# Patient Record
Sex: Male | Born: 1970 | Race: White | Hispanic: No | Marital: Married | Smoking: Never smoker
Health system: Southern US, Community
[De-identification: ages and names within clinical notes are randomized; demographics above are authoritative.]

## PROBLEM LIST (undated history)

## (undated) DIAGNOSIS — I10 Essential (primary) hypertension: Secondary | ICD-10-CM

## (undated) DIAGNOSIS — E782 Mixed hyperlipidemia: Secondary | ICD-10-CM

## (undated) DIAGNOSIS — IMO0002 Reserved for concepts with insufficient information to code with codable children: Secondary | ICD-10-CM

## (undated) DIAGNOSIS — J189 Pneumonia, unspecified organism: Secondary | ICD-10-CM

## (undated) DIAGNOSIS — R0602 Shortness of breath: Secondary | ICD-10-CM

## (undated) DIAGNOSIS — M62838 Other muscle spasm: Secondary | ICD-10-CM

## (undated) DIAGNOSIS — Z8709 Personal history of other diseases of the respiratory system: Secondary | ICD-10-CM

## (undated) DIAGNOSIS — R51 Headache: Secondary | ICD-10-CM

## (undated) DIAGNOSIS — R200 Anesthesia of skin: Secondary | ICD-10-CM

## (undated) DIAGNOSIS — Z87442 Personal history of urinary calculi: Secondary | ICD-10-CM

## (undated) DIAGNOSIS — C801 Malignant (primary) neoplasm, unspecified: Secondary | ICD-10-CM

## (undated) DIAGNOSIS — R519 Headache, unspecified: Secondary | ICD-10-CM

## (undated) HISTORY — PX: MRI: SHX5353

## (undated) HISTORY — PX: VASECTOMY: SHX75

## (undated) HISTORY — DX: Essential (primary) hypertension: I10

## (undated) HISTORY — DX: Mixed hyperlipidemia: E78.2

---

## 2003-04-23 HISTORY — PX: STAPEDECTOMY: SHX2435

## 2003-05-18 DIAGNOSIS — J189 Pneumonia, unspecified organism: Secondary | ICD-10-CM

## 2003-05-18 HISTORY — DX: Pneumonia, unspecified organism: J18.9

## 2004-06-03 ENCOUNTER — Ambulatory Visit: Payer: Self-pay | Admitting: Family Medicine

## 2004-07-01 ENCOUNTER — Ambulatory Visit: Payer: Self-pay | Admitting: Family Medicine

## 2004-09-10 ENCOUNTER — Ambulatory Visit: Payer: Self-pay | Admitting: Family Medicine

## 2004-11-02 ENCOUNTER — Ambulatory Visit: Payer: Self-pay | Admitting: Family Medicine

## 2005-03-29 ENCOUNTER — Ambulatory Visit: Payer: Self-pay | Admitting: Family Medicine

## 2006-06-07 ENCOUNTER — Ambulatory Visit: Payer: Self-pay | Admitting: Family Medicine

## 2006-09-12 ENCOUNTER — Ambulatory Visit: Payer: Self-pay | Admitting: Family Medicine

## 2008-05-17 DIAGNOSIS — C801 Malignant (primary) neoplasm, unspecified: Secondary | ICD-10-CM

## 2008-05-17 HISTORY — DX: Malignant (primary) neoplasm, unspecified: C80.1

## 2011-07-30 ENCOUNTER — Other Ambulatory Visit: Payer: Self-pay

## 2011-09-14 ENCOUNTER — Encounter: Payer: Self-pay | Admitting: *Deleted

## 2011-09-17 ENCOUNTER — Encounter: Payer: Self-pay | Admitting: *Deleted

## 2011-09-20 ENCOUNTER — Ambulatory Visit: Payer: Self-pay | Admitting: Cardiology

## 2011-09-21 ENCOUNTER — Encounter: Payer: Self-pay | Admitting: Cardiology

## 2011-09-22 ENCOUNTER — Ambulatory Visit (INDEPENDENT_AMBULATORY_CARE_PROVIDER_SITE_OTHER): Payer: BC Managed Care – PPO | Admitting: Cardiology

## 2011-09-22 ENCOUNTER — Other Ambulatory Visit: Payer: Self-pay | Admitting: Cardiology

## 2011-09-22 ENCOUNTER — Telehealth: Payer: Self-pay | Admitting: Cardiology

## 2011-09-22 ENCOUNTER — Encounter: Payer: Self-pay | Admitting: Cardiology

## 2011-09-22 ENCOUNTER — Encounter: Payer: Self-pay | Admitting: *Deleted

## 2011-09-22 VITALS — BP 117/70 | HR 71 | Ht 71.0 in | Wt 273.0 lb

## 2011-09-22 DIAGNOSIS — R0602 Shortness of breath: Secondary | ICD-10-CM

## 2011-09-22 DIAGNOSIS — R072 Precordial pain: Secondary | ICD-10-CM

## 2011-09-22 DIAGNOSIS — E782 Mixed hyperlipidemia: Secondary | ICD-10-CM

## 2011-09-22 DIAGNOSIS — I10 Essential (primary) hypertension: Secondary | ICD-10-CM

## 2011-09-22 NOTE — Assessment & Plan Note (Signed)
**Note De-Identified Camilo Obfuscation** Reports history of asthma. Could also be related to deconditioning and obesity. Further workup as planned.

## 2011-09-22 NOTE — Assessment & Plan Note (Addendum)
**Note De-Identified Torrance Obfuscation** On fenofibrate, followed by Dr. Lysbeth Galas.

## 2011-09-22 NOTE — Assessment & Plan Note (Signed)
**Note De-Identified Melhorn Obfuscation** Largely atypical in description, not recurrent since March. Suspect GI etiology based on symptoms, although he does have cardiac risk factors including obesity, hypertension, and hyperlipidemia. ECG is nonspecific. With associated shortness of breath as well, plan is to pursue an exercise echocardiogram for further risk stratification. If this is reassuring, would recommend medical therapy and observation. We also discussed diet and exercise.

## 2011-09-22 NOTE — Assessment & Plan Note (Signed)
**Note De-identified Ent Obfuscation** Blood pressure well-controlled today. 

## 2011-09-22 NOTE — Patient Instructions (Signed)
**Note De-identified Kullman Obfuscation** Your follow up will be based on your test results. Your physician recommends that you continue on your current medications as directed. Please refer to the Current Medication list given to you today. Your physician has requested that you have a stress echocardiogram. For further information please visit [email or web address]. Please follow instruction sheet as given. If the results of your test are normal or stable, you will receive a letter. If they are abnormal, the nurse will contact you by phone.  

## 2011-09-22 NOTE — Telephone Encounter (Signed)
**Note De-Identified Emory Obfuscation** stress echocardiogram 786.51, 786.05 scheduled for 09/28/2011 @ [facility]

## 2011-09-22 NOTE — Progress Notes (Signed)
**Note De-Identified Derek Donaldson** Clinical Summary Derek Donaldson is a 41 y.o.male referred by Derek Donaldson for cardiology consultation. Record review finds admission to [facility] back in March on the hospitalist service with chest pain. This was felt to be of GI origin, and he ruled out for myocardial infarction. ECG showed sinus rhythm at 76 with LVH and nonspecific ST changes, possibly early repolarization. He underwent a 6 minute walk test covering 1100 feet with no desaturation or chest pain.  He describes the episode of chest discomfort in question as being a dull ache mainly in the center of his chest, also under the left breast that occurred in the evening and woke him up. He felt as if there was a fullness, he had eaten some potatoes before going to bed. Reports main improvement in symptoms was with a GI cocktail. He denies frequent reflux but has had intermittent dysphasia. Otherwise he reports no exertional chest pain. He does indicate NYHA class II dyspnea on exertion.  He is a [employer] architect, has regular DOT physicals. States that he has had carotid Dopplers due to a bruit on the left, found that this was related to tortuosity rather than obstruction.   No Known Allergies  Current Outpatient Prescriptions  Medication Sig Dispense Refill  . Amlodipine-Valsartan-HCTZ 10-320-25 MG TABS Take 1 tablet by mouth daily.      Marland Kitchen aspirin EC 81 MG tablet Take 81 mg by mouth daily.      . budesonide-formoterol (SYMBICORT) 160-4.5 MCG/ACT inhaler Inhale 2 puffs into the lungs 2 (two) times daily.      . fenofibrate micronized (ANTARA) 130 MG capsule Take 130 mg by mouth daily before breakfast.      . Ibuprofen (ADVIL) 200 MG CAPS Take 1 capsule by mouth as needed.      . nebivolol (BYSTOLIC) 5 MG tablet Take 5 mg by mouth daily.      Marland Kitchen omeprazole (PRILOSEC) 20 MG capsule Take 20 mg by mouth daily.        Past Medical History  Diagnosis Date  . Essential hypertension, benign   . Mixed hyperlipidemia   . Sensorineural hearing  loss of low frequency   . Asthma     Past Surgical History  Procedure Date  . 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 Vasectomy     Family History  Problem Relation Age of Onset  . Stroke Paternal Grandfather   . Cancer Maternal Grandfather   . Asthma Mother   . Asthma Brother   . Heart failure Maternal Grandmother   . Aneurysm Father     Social History Derek Donaldson reports that he has never smoked. He has never used smokeless tobacco. Derek Donaldson reports that he does not drink alcohol.  Review of Systems No palpitations or syncope. Has been working on his diet, states that he has lost approximately 23 pounds. No regular exercise at this point. No syncope. No reported bleeding problems. No orthopnea or PND. Occasional dysphasia. Otherwise negative.   Physical Examination Filed Vitals:   09/22/11 0902  BP: 117/70  Pulse: 71   Obese male in no acute distress. HEENT: Conjunctiva and lids normal, oropharynx clear. Neck: Supple, no elevated JVP, soft left carotid bruit, no thyromegaly. Lungs: Clear to auscultation, nonlabored breathing at rest. Cardiac: Regular rate and rhythm, no S3 or significant systolic murmur, no pericardial rub. Abdomen: Soft, nontender, bowel sounds present, no guarding or rebound. Extremities: No pitting edema, distal pulses 2+. Skin: Warm and dry. Musculoskeletal: No **Note De-Identified Derek Donaldson** kyphosis. Neuropsychiatric: Alert and oriented x3, affect grossly appropriate.   Problem List and Plan   Precordial pain Largely atypical in description, not recurrent since March. Suspect GI etiology based on symptoms, although he does have cardiac risk factors including obesity, hypertension, and hyperlipidemia. ECG is nonspecific. With associated shortness of breath as well, plan is to pursue an exercise echocardiogram for further risk stratification. If this is reassuring, would recommend medical therapy and observation. We also discussed diet and exercise.  Shortness  of breath Reports history of asthma. Could also be related to deconditioning and obesity. Further workup as planned.  Mixed hyperlipidemia On fenofibrate, followed by Derek Donaldson.  Essential hypertension, benign Blood pressure well controlled today.   Derek Donaldson, M.D., F.A.C.C.

## 2011-09-23 NOTE — Telephone Encounter (Signed)
**Note De-Identified Eyerman Obfuscation** Auth # [id number] exp 10/22/11

## 2011-09-28 DIAGNOSIS — R079 Chest pain, unspecified: Secondary | ICD-10-CM

## 2011-10-22 ENCOUNTER — Telehealth: Payer: Self-pay | Admitting: *Deleted

## 2011-10-22 NOTE — Telephone Encounter (Signed)
**Note De-Identified Lamb[place] Obfuscation** Message copied by Eustace Moore on Fri Oct 22, 2011  8:42 AM ------      Message from: MCDOWELL, Illene Bolus      Created: Thu Oct 21, 2011  9:49 AM       Normal study, no evidence of ischemia. Continue observation and risk factor modification with primary care physician.

## 2011-10-22 NOTE — Telephone Encounter (Signed)
**Note De-identified Hasting Obfuscation** Patient informed. 

## 2012-02-04 ENCOUNTER — Encounter (HOSPITAL_COMMUNITY): Payer: Self-pay | Admitting: Emergency Medicine

## 2012-02-04 ENCOUNTER — Emergency Department (HOSPITAL_COMMUNITY): Payer: BC Managed Care – PPO

## 2012-02-04 ENCOUNTER — Emergency Department (HOSPITAL_COMMUNITY)
Admission: EM | Admit: 2012-02-04 | Discharge: 2012-02-04 | Disposition: A | Discharge status: Home / Self Care | Payer: BC Managed Care – PPO | Attending: Emergency Medicine | Admitting: Emergency Medicine

## 2012-02-04 DIAGNOSIS — Z7982 Long term (current) use of aspirin: Secondary | ICD-10-CM | POA: Insufficient documentation

## 2012-02-04 DIAGNOSIS — R071 Chest pain on breathing: Secondary | ICD-10-CM | POA: Insufficient documentation

## 2012-02-04 DIAGNOSIS — I10 Essential (primary) hypertension: Secondary | ICD-10-CM | POA: Insufficient documentation

## 2012-02-04 DIAGNOSIS — R0781 Pleurodynia: Secondary | ICD-10-CM

## 2012-02-04 DIAGNOSIS — J45909 Unspecified asthma, uncomplicated: Secondary | ICD-10-CM | POA: Insufficient documentation

## 2012-02-04 DIAGNOSIS — Z79899 Other long term (current) drug therapy: Secondary | ICD-10-CM | POA: Insufficient documentation

## 2012-02-04 LAB — BASIC METABOLIC PANEL
CO2: 25 mEq/L (ref 19–32)
Calcium: 10.2 mg/dL (ref 8.4–10.5)
Creatinine, Ser: 0.93 mg/dL (ref 0.50–1.35)
GFR calc Af Amer: >90 mL/min (ref >90)
Sodium: 138 mEq/L (ref 135–145)

## 2012-02-04 LAB — CBC
Hemoglobin: 14.9 g/dL (ref 13.0–17.0)
MCHC: 34.4 g/dL (ref 30.0–36.0)
Platelets: 248 10*3/uL (ref 150–400)
RBC: 4.9 MIL/uL (ref 4.22–5.81)

## 2012-02-04 LAB — D-DIMER, QUANTITATIVE: D-Dimer, Quant: 0.3 ug/mL-FEU (ref 0.00–0.48)

## 2012-02-04 LAB — TROPONIN I: Troponin I: <0.3 ng/mL (ref <0.30)

## 2012-02-04 MED ORDER — GI COCKTAIL ~~LOC~~
30.0000 mL | Freq: Once | ORAL | Status: AC
  Administered 2012-02-04: 30 mL via ORAL
  Filled 2012-02-04: qty 30

## 2012-02-04 MED ORDER — TRAMADOL HCL 50 MG PO TABS: 50.0000 mg | ORAL_TABLET | Freq: Four times a day (QID) | ORAL | Status: DC | PRN

## 2012-02-04 MED ORDER — OXYCODONE-ACETAMINOPHEN 5-325 MG PO TABS
1.0000 | ORAL_TABLET | Freq: Once | ORAL | Status: DC
  Filled 2012-02-04: qty 1

## 2012-02-04 MED ORDER — PANTOPRAZOLE SODIUM 40 MG IV SOLR
40.0000 mg | Freq: Once | INTRAVENOUS | Status: AC
  Administered 2012-02-04: 40 mg via INTRAVENOUS
  Filled 2012-02-04: qty 40

## 2012-02-04 NOTE — ED Notes (Signed)
**Note De-Identified Mcconaughey Obfuscation** To ED from work [facility], pt reports CP onset today, assosciated with SOB, VSS, 325 ASA pta, NAD

## 2012-02-04 NOTE — ED Provider Notes (Signed)
**Note De-Identified Kaufhold Obfuscation** Medical screening examination/treatment/procedure(s) were performed by non-physician practitioner and as supervising physician I was immediately available for consultation/collaboration.   Gwyneth Sprout, MD 02/04/12 1538

## 2012-02-04 NOTE — ED Provider Notes (Signed)
**Note De-Identified Campton Obfuscation** History     CSN: [id number]  Arrival date & time 02/04/12  1307   First MD Initiated Contact with Patient 02/04/12 1323      Chief Complaint  Patient presents with  . Chest Pain    (Consider location/radiation/quality/duration/timing/severity/associated sxs/prior treatment) HPI  41 year old male with history of hypertension, and history of asthma present complaining of chest pain. 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 For the past 2 months he has three isolated episodes of substernal chest pain.   Episode begins today while he was sitting and driving his truck. Onset acute.  Describe pain as pleuritic, worsening with taking deep breath, with movement, or with laughing. Pain radiates to back.  Pain has been persistent.  He endorse mild SOB but denies fever, cough, or hemoptysis.  No complaint of calves tenderness or leg swelling.  No recent surgery.  Pain  improved with prilosec in the past but did not help this time.  He also took an aspirin in the AM with no improvement.  He recall same pain that happened 3 days ago and lasting for nearly an hour and resolved without treatment.  His first chest pain episode happened in April in which he had a cardiac work up including tread mill test but it was found to be non-cardiac related.  Pt is not a smoker, no hx of diabetes and no significant family hx of heart disease.  He denies increased DOE.  Denies fever, chills, n/v, or dysuria  Past Medical History  Diagnosis Date  . Essential hypertension, benign   . Mixed hyperlipidemia   . Sensorineural hearing loss of low frequency   . Asthma     Past Surgical History  Procedure Date  . 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 Vasectomy     Family History  Problem Relation Age of Onset  . Stroke Paternal Grandfather   . Cancer Maternal Grandfather   . Asthma Mother   . Asthma Brother   . Heart failure Maternal Grandmother   . Aneurysm Father      History  Substance Use Topics  . Smoking status: Never Smoker   . Smokeless tobacco: Never Used  . Alcohol Use: No      Review of Systems  All other systems reviewed and are negative.    Allergies  Review of patient's allergies indicates no known allergies.  Home Medications   Current Outpatient Rx  Name Route Sig Dispense Refill  . AMLODIPINE-VALSARTAN-HCTZ 10-320-25 MG PO TABS Oral Take 1 tablet by mouth daily.    . ASPIRIN EC 81 MG PO TBEC Oral Take 81 mg by mouth daily.    . BUDESONIDE-FORMOTEROL FUMARATE 160-4.5 MCG/ACT IN AERO Inhalation Inhale 2 puffs into the lungs 2 (two) times daily.    . FENOFIBRATE MICRONIZED 130 MG PO CAPS Oral Take 130 mg by mouth daily before breakfast.    . IBUPROFEN 200 MG PO CAPS Oral Take 1 capsule by mouth as needed.    . NEBIVOLOL HCL 5 MG PO TABS Oral Take 5 mg by mouth daily.    Marland Kitchen OMEPRAZOLE 20 MG PO CPDR Oral Take 20 mg by mouth daily.      BP 140/84  Pulse 75  Temp 98 F (36.7 C) (Oral)  Resp 14  SpO2 95%  Physical Exam  Nursing note and vitals reviewed. Constitutional: He appears well-developed and well-nourished. No distress.       Awake, alert, nontoxic appearance **Note De-Identified Uzelac Obfuscation** HENT:  Head: Atraumatic.  Eyes: Conjunctivae normal are normal. Right eye exhibits no discharge. Left eye exhibits no discharge.  Neck: Normal range of motion. Neck supple.  Cardiovascular: Normal rate and regular rhythm.   Pulmonary/Chest: Effort normal. No respiratory distress. He exhibits no tenderness.       Although pt point to substernum as location of his pain, it is non reproducible with palpation.    Abdominal: Soft. There is tenderness in the epigastric area. There is no rigidity, no rebound, no guarding, no tenderness at McBurney's point and negative Murphy's sign. No hernia.  Musculoskeletal: He exhibits no edema and no tenderness.       ROM appears intact, no obvious focal weakness  Neurological: He is alert.  Skin: Skin is warm and dry. No  rash noted.  Psychiatric: He has a normal mood and affect.    ED Course  Procedures (including critical care time)  Labs Reviewed - No data to display No results found.   No diagnosis found.   Date: 02/04/2012  Rate: 77  Rhythm: normal sinus rhythm  QRS Axis: left  Intervals: normal  ST/T Wave abnormalities: normal  Conduction Disutrbances:none  Narrative Interpretation: probable LVH  Old EKG Reviewed: none available  Results for orders placed during the hospital encounter of 02/04/12  CBC      Component Value Range   WBC 11.8 (*) 4.0 - 10.5 K/uL   RBC 4.90  4.22 - 5.81 MIL/uL   Hemoglobin 14.9  13.0 - 17.0 g/dL   HCT 13.2  44.0 - 10.2 %   MCV 88.4  78.0 - 100.0 fL   MCH 30.4  26.0 - 34.0 pg   MCHC 34.4  30.0 - 36.0 g/dL   RDW 72.5  36.6 - 44.0 %   Platelets 248  150 - 400 K/uL  TROPONIN I      Component Value Range   Troponin I <0.30  <0.30 ng/mL  BASIC METABOLIC PANEL      Component Value Range   Sodium 138  135 - 145 mEq/L   Potassium 3.4 (*) 3.5 - 5.1 mEq/L   Chloride 101  96 - 112 mEq/L   CO2 25  19 - 32 mEq/L   Glucose, Bld 101 (*) 70 - 99 mg/dL   BUN 14  6 - 23 mg/dL   Creatinine, Ser 3.47  0.50 - 1.35 mg/dL   Calcium 42.5  8.4 - 95.6 mg/dL   GFR calc non Af Amer >90  >90 mL/min   GFR calc Af Amer >90  >90 mL/min  D-DIMER, QUANTITATIVE      Component Value Range   D-Dimer, Quant 0.30  0.00 - 0.48 ug/mL-FEU   Dg Chest Port 1 View  02/04/2012  *RADIOLOGY REPORT*  Clinical Data: Recurring chest pain over 3 days, hypertension  PORTABLE CHEST - 1 VIEW  Comparison: Portable exam 1400 hours compared to 07/30/2011  Findings: Upper normal heart size. Mediastinal contours and pulmonary vascularity normal for technique. Slightly decreased lung volumes with minimal bibasilar atelectasis. Upper lungs clear. No pleural effusion or pneumothorax. Bones unremarkable.  IMPRESSION: Minimal bibasilar atelectasis.   Original Report Authenticated By: Lollie Marrow, M.D.      1. Chest pain  MDM  Atypical chest pain, with pleuritic component.  Will order d-dimer to r/o PE.  Will obtain CXR, labs, ECG, troponin.  Will also give GI cocktail and protonix for suspected GERD.  Pt has cardiac work up with treadmill test earlier this year, doubt **Note De-Identified Ilic Obfuscation** cardiac induce cp at this time.     2:49 PM Pt report minimal improvement with GI cocktail and protonix.  His work up today is unremarkable.  D-dimer neg, CXR shows no acute findings. Normal troponin.  ECG unremarkable, not supportive of pericarditis either.  I recommend pt to f/u with his PCP Dr. Lysbeth Galas for further care.  Will prescribe short course of pain medications.  Recommend no driving or operate heavy machinery while on medication.  Pt voice understanding and agrees with plan.  Discussed care with my attending.      Fayrene Helper, PA-C 02/04/12 1453

## 2012-02-17 ENCOUNTER — Encounter (HOSPITAL_COMMUNITY): Payer: Self-pay | Admitting: Emergency Medicine

## 2012-02-17 ENCOUNTER — Emergency Department (HOSPITAL_COMMUNITY)
Admission: EM | Admit: 2012-02-17 | Discharge: 2012-02-18 | Disposition: A | Discharge status: Home / Self Care | Payer: BC Managed Care – PPO | Attending: Emergency Medicine | Admitting: Emergency Medicine

## 2012-02-17 ENCOUNTER — Emergency Department (HOSPITAL_COMMUNITY): Payer: BC Managed Care – PPO

## 2012-02-17 ENCOUNTER — Other Ambulatory Visit: Payer: Self-pay

## 2012-02-17 DIAGNOSIS — E785 Hyperlipidemia, unspecified: Secondary | ICD-10-CM | POA: Insufficient documentation

## 2012-02-17 DIAGNOSIS — H905 Unspecified sensorineural hearing loss: Secondary | ICD-10-CM | POA: Insufficient documentation

## 2012-02-17 DIAGNOSIS — J45909 Unspecified asthma, uncomplicated: Secondary | ICD-10-CM | POA: Insufficient documentation

## 2012-02-17 DIAGNOSIS — Z79899 Other long term (current) drug therapy: Secondary | ICD-10-CM | POA: Insufficient documentation

## 2012-02-17 DIAGNOSIS — R1013 Epigastric pain: Secondary | ICD-10-CM | POA: Insufficient documentation

## 2012-02-17 DIAGNOSIS — K219 Gastro-esophageal reflux disease without esophagitis: Secondary | ICD-10-CM | POA: Insufficient documentation

## 2012-02-17 DIAGNOSIS — I10 Essential (primary) hypertension: Secondary | ICD-10-CM | POA: Insufficient documentation

## 2012-02-17 LAB — CBC WITH DIFFERENTIAL/PLATELET
Basophils Absolute: 0 10*3/uL (ref 0.0–0.1)
Basophils Relative: 0 % (ref 0–1)
HCT: 41.2 % (ref 39.0–52.0)
Lymphocytes Relative: 9 % — ABNORMAL LOW (ref 12–46)
MCHC: 35.4 g/dL (ref 30.0–36.0)
Monocytes Absolute: 0.5 10*3/uL (ref 0.1–1.0)
Neutro Abs: 10 10*3/uL — ABNORMAL HIGH (ref 1.7–7.7)
Neutrophils Relative %: 86 % — ABNORMAL HIGH (ref 43–77)
Platelets: 285 10*3/uL (ref 150–400)
RDW: 12.7 % (ref 11.5–15.5)
WBC: 11.7 10*3/uL — ABNORMAL HIGH (ref 4.0–10.5)

## 2012-02-17 LAB — COMPREHENSIVE METABOLIC PANEL
ALT: 86 U/L — ABNORMAL HIGH (ref 0–53)
AST: 85 U/L — ABNORMAL HIGH (ref 0–37)
Albumin: 4.6 g/dL (ref 3.5–5.2)
Alkaline Phosphatase: 61 U/L (ref 39–117)
CO2: 25 mEq/L (ref 19–32)
Chloride: 103 mEq/L (ref 96–112)
Creatinine, Ser: 1.05 mg/dL (ref 0.50–1.35)
GFR calc non Af Amer: 87 mL/min — ABNORMAL LOW (ref >90)
Potassium: 3.8 mEq/L (ref 3.5–5.1)
Sodium: 139 mEq/L (ref 135–145)
Total Bilirubin: 1.4 mg/dL — ABNORMAL HIGH (ref 0.3–1.2)

## 2012-02-17 MED ORDER — GI COCKTAIL ~~LOC~~
30.0000 mL | Freq: Once | ORAL | Status: AC
  Administered 2012-02-17: 30 mL via ORAL
  Filled 2012-02-17: qty 30

## 2012-02-17 MED ORDER — GI COCKTAIL ~~LOC~~: 15.0000 mL | Freq: Two times a day (BID) | ORAL | Status: DC

## 2012-02-17 MED ORDER — MORPHINE SULFATE 4 MG/ML IJ SOLN
4.0000 mg | Freq: Once | INTRAMUSCULAR | Status: AC
Start: 2012-02-17 — End: 2012-02-17
  Administered 2012-02-17: 4 mg via INTRAVENOUS
  Filled 2012-02-17: qty 1

## 2012-02-17 MED ORDER — SODIUM CHLORIDE 0.9 % IV BOLUS (SEPSIS)
1000.0000 mL | Freq: Once | INTRAVENOUS | Status: AC
  Administered 2012-02-17: 1000 mL via INTRAVENOUS

## 2012-02-17 MED ORDER — ONDANSETRON HCL 4 MG/2ML IJ SOLN
4.0000 mg | Freq: Once | INTRAMUSCULAR | Status: AC
  Administered 2012-02-17: 4 mg via INTRAVENOUS
  Filled 2012-02-17: qty 2

## 2012-02-17 NOTE — ED Notes (Signed)
**Note De-Identified Nola Obfuscation** Patient reports abdominal pain that started around 1500 this afternoon; patient reports history of GERD.  Patient states that abdominal pain worsens upon lying down; shortness of breath also occurs.  Denies nausea, vomiting, and diarrhea.  Patient was seen two weeks ago for the same symptoms; patient was given a GI cocktail -- 30 minutes after administration, pain was gone.

## 2012-02-17 NOTE — ED Provider Notes (Signed)
**Note De-Identified Kendle Obfuscation** History     CSN: [id number]  Arrival date & time 02/17/12  2140   First MD Initiated Contact with Patient 02/17/12 2223      Chief Complaint  Patient presents with  . Abdominal Pain    (Consider location/radiation/quality/duration/timing/severity/associated sxs/prior treatment) HPI  41 y.o. male with past medical history significant for hyperlipidemia, hypertension and GERD c/o severe epigastric pain starting at 11 AM after eating associated with sensation of bloating. Pt consistently has pain after eating, but today it is much more severe. States the pain is exacerbated by movement, deepbrethaing and lying supine. Denies fever, shortness of breath, chest pain, diarrhea, nausea, vomiting.    Stress test 4 months ago with no abnormalities  Past Medical History  Diagnosis Date  . Essential hypertension, benign   . Mixed hyperlipidemia   . Sensorineural hearing loss of low frequency   . Asthma   . GERD (gastroesophageal reflux disease)     Past Surgical History  Procedure Date  . 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 Vasectomy     Family History  Problem Relation Age of Onset  . Stroke Paternal Grandfather   . Cancer Maternal Grandfather   . Asthma Mother   . Asthma Brother   . Heart failure Maternal Grandmother   . Aneurysm Father     History  Substance Use Topics  . Smoking status: Never Smoker   . Smokeless tobacco: Never Used  . Alcohol Use: No      Review of Systems  Constitutional: Negative for fever.  Respiratory: Negative for shortness of breath.   Cardiovascular: Negative for chest pain.  Gastrointestinal: Positive for abdominal pain. Negative for nausea, vomiting and diarrhea.  All other systems reviewed and are negative.    Allergies  Review of patient's allergies indicates no known allergies.  Home Medications   Current Outpatient Rx  Name Route Sig Dispense Refill  . ACETAMINOPHEN 500 MG PO TABS Oral Take 1,000 mg by  mouth every 6 (six) hours as needed. For pain    . AMLODIPINE-VALSARTAN-HCTZ 10-320-25 MG PO TABS Oral Take 1 tablet by mouth daily.    . ASPIRIN EC 81 MG PO TBEC Oral Take 81 mg by mouth daily.    . BUDESONIDE-FORMOTEROL FUMARATE 160-4.5 MCG/ACT IN AERO Inhalation Inhale 2 puffs into the lungs 2 (two) times daily.    . DEXLANSOPRAZOLE 60 MG PO CPDR Oral Take 60 mg by mouth daily.    . FENOFIBRATE MICRONIZED 130 MG PO CAPS Oral Take 130 mg by mouth daily before breakfast.    . NEBIVOLOL HCL 5 MG PO TABS Oral Take 5 mg by mouth daily.    Marland Kitchen OMEPRAZOLE 20 MG PO CPDR Oral Take 20 mg by mouth daily.      BP 128/77  Pulse 70  Temp 97.7 F (36.5 C) (Oral)  Resp 19  SpO2 96%  Physical Exam  Nursing note and vitals reviewed. Constitutional: He is oriented to person, place, and time. He appears well-developed and well-nourished. No distress.       Patient is staying very still, eyes are closed he is grasping his upper abdomen  HENT:  Head: Normocephalic.  Mouth/Throat: Oropharynx is clear and moist.  Eyes: Conjunctivae normal and EOM are normal.  Neck: Normal range of motion.  Cardiovascular: Normal rate, regular rhythm and normal heart sounds.   Pulmonary/Chest: Effort normal and breath sounds normal. No stridor. No respiratory distress. He has no wheezes. He has no rales. He exhibits **Note De-Identified Stitzer Obfuscation** no tenderness.  Abdominal: Soft. Bowel sounds are normal. He exhibits no distension and no mass. There is tenderness. There is no rebound and no guarding.       Positive Murphy sign, also tender to palpation of the epigastrium, no peritoneal signs  Musculoskeletal: Normal range of motion.  Neurological: He is alert and oriented to person, place, and time.  Skin: Skin is warm.  Psychiatric: He has a normal mood and affect.    ED Course  Procedures (including critical care time)  Labs Reviewed  CBC WITH DIFFERENTIAL - Abnormal; Notable for the following:    WBC 11.7 (*)     Neutrophils Relative 86 (*)       Neutro Abs 10.0 (*)     Lymphocytes Relative 9 (*)     All other components within normal limits  COMPREHENSIVE METABOLIC PANEL - Abnormal; Notable for the following:    Glucose, Bld 140 (*)     AST 85 (*)     ALT 86 (*)     Total Bilirubin 1.4 (*)     GFR calc non Af Amer 87 (*)     All other components within normal limits  URINALYSIS, ROUTINE W REFLEX MICROSCOPIC  LIPASE, BLOOD   Dg Abd Acute W/chest  02/17/2012  *RADIOLOGY REPORT*  Clinical Data: Abdominal pain starting today.  Shortness of breath.  ACUTE ABDOMEN SERIES (ABDOMEN 2 VIEW & CHEST 1 VIEW)  Comparison: 02/04/2012.  Findings: There is some increased material in the stomach.  Stool is mildly increased in the colon.  No obstruction or free air. Normal soft tissues and bony structures.  The lungs are clear.  The heart, mediastinum hila are unremarkable.  No significant bony abnormality.  IMPRESSION: No acute findings.  Mild increase stool in the colon and some increased material in the stomach.   Original Report Authenticated By: Domenic Moras, M.D.     Date: 02/17/2012  Rate: 71  Rhythm: normal sinus rhythm  QRS Axis: indeterminate  Intervals: normal  ST/T Wave abnormalities: normal  Conduction Disutrbances:none  Narrative Interpretation:   Old EKG Reviewed: none available  1. Epigastric pain       MDM  Suspect biliary colic/cholecystitis vs PUD >> ACS  Abdominal exam shows no peritoneal signs, AAS and [place] ordered.   Pain control with morphine and GI cocktail.   AAS wnl  12:10 AM patient reexamined he is significantly improved with pain down 2/10.    12:16 AM ultrasound shows multiple gallstones but no evidence of acute cholecystitis male to be fatty infiltration of the liver.  No acute findings and patient is significantly improved I will discharge him with pain control and GI followup.   Pt verbalized understanding and agrees with care plan. Outpatient follow-up and return precautions given.    New  Prescriptions   ALUM & MAG HYDROXIDE-SIMETH (GI COCKTAIL) SUSP SUSPENSION    Take 15 mLs by mouth 3 (three) times daily. Shake well.   OXYCODONE-ACETAMINOPHEN (PERCOCET/ROXICET) 5-325 MG PER TABLET    1 to 2 tabs PO q6hrs  PRN for pain    Wynetta Emery, PA-C 02/18/12 0017

## 2012-02-18 LAB — POCT I-STAT TROPONIN I: Troponin i, poc: 0 ng/mL (ref 0.00–0.08)

## 2012-02-18 MED ORDER — GI COCKTAIL ~~LOC~~: 15.0000 mL | Freq: Three times a day (TID) | ORAL | Status: DC

## 2012-02-18 MED ORDER — OXYCODONE-ACETAMINOPHEN 5-325 MG PO TABS: ORAL_TABLET | ORAL | Status: DC

## 2012-02-20 NOTE — ED Provider Notes (Signed)
**Note De-Identified Abboud Obfuscation** Medical screening examination/treatment/procedure(s) were performed by non-physician practitioner and as supervising physician I was immediately available for consultation/collaboration.    Shelda Jakes, MD 02/20/12 2217

## 2012-02-23 ENCOUNTER — Encounter (INDEPENDENT_AMBULATORY_CARE_PROVIDER_SITE_OTHER): Payer: Self-pay

## 2012-03-06 ENCOUNTER — Encounter (INDEPENDENT_AMBULATORY_CARE_PROVIDER_SITE_OTHER): Payer: Self-pay | Admitting: Surgery

## 2012-03-06 ENCOUNTER — Ambulatory Visit (INDEPENDENT_AMBULATORY_CARE_PROVIDER_SITE_OTHER): Payer: BC Managed Care – PPO | Admitting: Surgery

## 2012-03-06 VITALS — BP 124/88 | HR 100 | Resp 16 | Ht 71.0 in | Wt 263.0 lb

## 2012-03-06 DIAGNOSIS — K8010 Calculus of gallbladder with chronic cholecystitis without obstruction: Secondary | ICD-10-CM

## 2012-03-06 NOTE — Patient Instructions (Addendum)
**Note De-Identified Haberkorn Obfuscation** See the Handout(s) we gave you.  Consider surgery.  Please call our office at [phone] if you wish to schedule surgery or if you have further questions / concerns.   Cholelithiasis Cholelithiasis (also called gallstones) is a form of gallbladder disease where gallstones form in your gallbladder. The gallbladder is a non-essential organ that stores bile made in the liver, which helps digest fats. Gallstones begin as small crystals and slowly grow into stones. Gallstone pain occurs when the gallbladder spasms, and a gallstone is blocking the duct. Pain can also occur when a stone passes out of the duct.  Women are more likely to develop gallstones than men. Other factors that increase the risk of gallbladder disease are:  Having multiple pregnancies. Physicians sometimes advise removing diseased gallbladders before future pregnancies.  Obesity.  Diets heavy in fried foods and fat.  Increasing age (older than 17).  Prolonged use of medications containing male hormones.  Diabetes mellitus.  Rapid weight loss.  Family history of gallstones (heredity). SYMPTOMS  Feeling sick to your stomach (nauseous).  Abdominal pain.  Yellowing of the skin (jaundice).  Sudden pain. It may persist from several minutes to several hours.  Worsening pain with deep breathing or when jarred.  Fever.  Tenderness to the touch. In some cases, when gallstones do not move into the bile duct, people have no pain or symptoms. These are called "silent" gallstones. TREATMENT In severe cases, emergency surgery may be required. HOME CARE INSTRUCTIONS   Only take over-the-counter or prescription medicines for pain, discomfort, or fever as directed by your caregiver.  Follow a low-fat diet until seen again. Fat causes the gallbladder to contract, which can result in pain.  Follow up as instructed. Attacks are almost always recurrent and surgery is usually required for permanent treatment. SEEK  IMMEDIATE MEDICAL CARE IF:   Your pain increases and is not controlled by medications.  You have an oral temperature above 102 F (38.9 C), not controlled by medication.  You develop nausea and vomiting. MAKE SURE YOU:   Understand these instructions.  Will watch your condition.  Will get help right away if you are not doing well or get worse. Document Released: 04/29/2005 Document Revised: 07/26/2011 Document Reviewed: 07/02/2010 [facility] Patient Information 2013 [place], [place].  Gastroesophageal Reflux Disease, Adult Gastroesophageal reflux disease (GERD) happens when acid from your stomach flows up into the esophagus. When acid comes in contact with the esophagus, the acid causes soreness (inflammation) in the esophagus. Over time, GERD may create small holes (ulcers) in the lining of the esophagus. CAUSES   Increased body weight. This puts pressure on the stomach, making acid rise from the stomach into the esophagus.  Smoking. This increases acid production in the stomach.  Drinking alcohol. This causes decreased pressure in the lower esophageal sphincter (valve or ring of muscle between the esophagus and stomach), allowing acid from the stomach into the esophagus.  Late evening meals and a full stomach. This increases pressure and acid production in the stomach.  A malformed lower esophageal sphincter. Sometimes, no cause is found. SYMPTOMS   Burning pain in the lower part of the mid-chest behind the breastbone and in the mid-stomach area. This may occur twice a week or more often.  Trouble swallowing.  Sore throat.  Dry cough.  Asthma-like symptoms including chest tightness, shortness of breath, or wheezing. DIAGNOSIS  Your caregiver may be able to diagnose GERD based on your symptoms. In some cases, X-rays and other tests may be done **Note De-Identified Moro Obfuscation** to check for complications or to check the condition of your stomach and esophagus. TREATMENT  Your caregiver may recommend  over-the-counter or prescription medicines to help decrease acid production. Ask your caregiver before starting or adding any new medicines.  HOME CARE INSTRUCTIONS   Change the factors that you can control. Ask your caregiver for guidance concerning weight loss, quitting smoking, and alcohol consumption.  Avoid foods and drinks that make your symptoms worse, such as:  Caffeine or alcoholic drinks.  Chocolate.  Peppermint or mint flavorings.  Garlic and onions.  Spicy foods.  Citrus fruits, such as oranges, lemons, or limes.  Tomato-based foods such as sauce, chili, salsa, and pizza.  Fried and fatty foods.  Avoid lying down for the 3 hours prior to your bedtime or prior to taking a nap.  Eat small, frequent meals instead of large meals.  Wear loose-fitting clothing. Do not wear anything tight around your waist that causes pressure on your stomach.  Raise the head of your bed 6 to 8 inches with wood blocks to help you sleep. Extra pillows will not help.  Only take over-the-counter or prescription medicines for pain, discomfort, or fever as directed by your caregiver.  Do not take aspirin, ibuprofen, or other nonsteroidal anti-inflammatory drugs (NSAIDs). SEEK IMMEDIATE MEDICAL CARE IF:   You have pain in your arms, neck, jaw, teeth, or back.  Your pain increases or changes in intensity or duration.  You develop nausea, vomiting, or sweating (diaphoresis).  You develop shortness of breath, or you faint.  Your vomit is green, yellow, black, or looks like coffee grounds or blood.  Your stool is red, bloody, or black. These symptoms could be signs of other problems, such as heart disease, gastric bleeding, or esophageal bleeding. MAKE SURE YOU:   Understand these instructions.  Will watch your condition.  Will get help right away if you are not doing well or get worse. Document Released: 02/10/2005 Document Revised: 07/26/2011 Document Reviewed:  11/20/2010 [facility] Patient Information 2013 [place], [place].

## 2012-03-06 NOTE — Progress Notes (Signed)
**Note De-Identified Balliet Obfuscation** Subjective:     Patient ID: Derek Donaldson, male   DOB: [birth year], 41 y.o.   MRN: [id number]  HPI  Derek Donaldson  1970-05-30 [id number]  Patient Care Team: Joette Catching, MD as PCP - General (Family Medicine) Petra Kuba, MD as Consulting Physician (Gastroenterology) Jonelle Sidle, MD as Consulting Physician (Cardiology) Ardeth Sportsman, MD as Consulting Physician (General Surgery)  This patient is a 41 y.o.male who presents today for surgical evaluation at the request of Dr. Ewing Schlein.   Reason for evaluation: Epigastric pain.  Gallstones.  Question of biliary etiology.  Pleasant obese male.  Actively been losing weight this year.  Has had episodes of severe intermittent epigastric pain.  Often radiates to back and shoulder.  Tends not to favor a side.  No real nausea or vomiting but often has loading with it.  Usually after eating.  Not always with fatty foods of the worst attack was after going to [employer] having chicken and french fries.  Has reflux and has had evidence of epigastric irritation but no her radiation up the throat or chest.  Can sleep lying down flat no problem bending over.  Has been on omeprazole for some time.  Was switched to Dexon.  Was increased to double dose.  Symptoms may be a little better but still getting attacks.  Had a more severe episode earlier in the year.  Had cardiac workup that was completely normal.  Normally her walk 30 minutes without much difficulty.  Bowel movements every morning.  Was seen by gastroenterology.  Initially they were favoring reflux.  However, endoscopy did not show any major abnormalities and therefore it they were more inclined to think of biliary etiology.  Had increased bilirubin on one test.  Ultrasound showed gallstones.  Sent to me for consideration of cholecystectomy.  Patient Active Problem List  Diagnosis  . Essential hypertension, benign  . Mixed hyperlipidemia  . Chest pain, midsternal  . Shortness of breath  . GERD  (gastroesophageal reflux disease)  . Chronic cholecystitis with calculus    Past Medical History  Diagnosis Date  . Essential hypertension, benign   . Mixed hyperlipidemia   . Sensorineural hearing loss of low frequency   . Asthma   . GERD (gastroesophageal reflux disease)     Past Surgical History  Procedure Date  . 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 Vasectomy     History   Social History  . Marital Status: Married    Spouse Name: N/A    Number of Children: N/A  . Years of Education: N/A   Occupational History  . Not on file.   Social History Main Topics  . Smoking status: Never Smoker   . Smokeless tobacco: Never Used  . Alcohol Use: No  . Drug Use: No  . Sexually Active: Not on file   Other Topics Concern  . Not on file   Social History Narrative  . No narrative on file    Family History  Problem Relation Age of Onset  . Stroke Paternal Grandfather   . Cancer Maternal Grandfather     unknown type  . Asthma Mother   . Asthma Brother   . Heart failure Maternal Grandmother   . Aneurysm Father     Current Outpatient Prescriptions  Medication Sig Dispense Refill  . acetaminophen (TYLENOL) 500 MG tablet Take 1,000 mg by mouth every 6 (six) hours as needed. For pain      . Alum & **Note De-Identified Dicola Obfuscation** Mag Hydroxide-Simeth (GI COCKTAIL) SUSP suspension Take 15 mLs by mouth 3 (three) times daily. Shake well.  200 mL  0  . Amlodipine-Valsartan-HCTZ 10-320-25 MG TABS Take 1 tablet by mouth daily.      Marland Kitchen aspirin EC 81 MG tablet Take 81 mg by mouth daily.      . budesonide-formoterol (SYMBICORT) 160-4.5 MCG/ACT inhaler Inhale 2 puffs into the lungs 2 (two) times daily.      Marland Kitchen dexlansoprazole (DEXILANT) 60 MG capsule Take 60 mg by mouth daily.      . fenofibrate micronized (ANTARA) 130 MG capsule Take 130 mg by mouth daily before breakfast.      . nebivolol (BYSTOLIC) 5 MG tablet Take 5 mg by mouth daily.      Marland Kitchen oxyCODONE-acetaminophen (PERCOCET/ROXICET) 5-325  MG per tablet 1 to 2 tabs PO q6hrs  PRN for pain  10 tablet  0     No Known Allergies  BP 124/88  Pulse 100  Resp 16  Ht 5\' 11"  (1.803 m)  Wt 263 lb (119.296 kg)  BMI 36.68 kg/m2  US Abdomen Complete  02/18/2012  *RADIOLOGY REPORT*  Clinical Data:  COMPLETE ABDOMINAL ULTRASOUND  Comparison:  None.  Findings:  Gallbladder:  There are multiple gallstones.  The largest measures 18 mm.  There is no gallbladder wall thickening and no pericholecystic fluid.  No evidence of acute cholecystitis.  Common bile duct:  Common duct not well seen distally that normal in caliber proximally measuring 6.5 mm.  Liver:  Liver shows increased echogenicity suggesting diffuse fatty infiltration.  Liver is normal in overall size with no mass or focal lesion.  IVC:  Appears normal.  Pancreas:  Pancreas is echogenic, where visualized, consistent with partial fatty replacement.  Much of the inferior head and distal tail were obscured by overlying bowel gas.  Spleen:  9.1 cm, normal in overall size and echogenicity  Right Kidney:  12.5 cm, normal in size and echogenicity with no masses, stones or hydronephrosis  Left Kidney:  14.5 cm,  normal in size and echogenicity with no masses or stones or hydronephrosis  Abdominal aorta:  Aorta not visualized due to overlying bowel gas  IMPRESSION: Exam is somewhat limited by the patient's body habitus and midline bowel gas.  Allowing for this, there are multiple gallstones but no evidence of acute cholecystitis.  There is evidence suggesting fatty infiltration of the liver.  No other abnormalities.   Original Report Authenticated By: Domenic Moras, M.D.    Dg Abd Acute W/chest  02/17/2012  *RADIOLOGY REPORT*  Clinical Data: Abdominal pain starting today.  Shortness of breath.  ACUTE ABDOMEN SERIES (ABDOMEN 2 VIEW & CHEST 1 VIEW)  Comparison: 02/04/2012.  Findings: There is some increased material in the stomach.  Stool is mildly increased in the colon.  No obstruction or free air.  Normal soft tissues and bony structures.  The lungs are clear.  The heart, mediastinum hila are unremarkable.  No significant bony abnormality.  IMPRESSION: No acute findings.  Mild increase stool in the colon and some increased material in the stomach.   Original Report Authenticated By: Domenic Moras, M.D.    Results for GERLAD, PELZEL (MRN [id number]) as of 03/06/2012 14:10  Ref. Range 02/17/2012 22:20  Sodium Latest Range: 135-145 mEq/L 139  Potassium Latest Range: 3.5-5.1 mEq/L 3.8  Chloride Latest Range: 96-112 mEq/L 103  CO2 Latest Range: 19-32 mEq/L 25  BUN Latest Range: 6-23 mg/dL 16  Creatinine Latest Range: 0.50-1.35 mg/dL 8.46 **Note De-Identified Matzek Obfuscation** Calcium Latest Range: 8.4-10.5 mg/dL 81.1  GFR calc non Af Amer Latest Range: >90 mL/min 87 (L)  GFR calc Af Amer Latest Range: >90 mL/min >90  Glucose Latest Range: 70-99 mg/dL 914 (H)  Alkaline Phosphatase Latest Range: 39-117 U/L 61  Albumin Latest Range: 3.5-5.2 g/dL 4.6  AST Latest Range: 0-37 U/L 85 (H)  ALT Latest Range: 0-53 U/L 86 (H)  Total Protein Latest Range: 6.0-8.3 g/dL 7.8  Total Bilirubin Latest Range: 0.3-1.2 mg/dL 1.4 (H)    Review of Systems  Constitutional: Negative for fever, chills and diaphoresis.  HENT: Negative for nosebleeds, sore throat, facial swelling, mouth sores, trouble swallowing, neck pain and ear discharge.   Eyes: Negative for photophobia, discharge and visual disturbance.  Respiratory: Negative for choking, chest tightness, shortness of breath and stridor.   Cardiovascular: Negative for chest pain and palpitations.  Gastrointestinal: Positive for abdominal pain and abdominal distention. Negative for nausea, vomiting, diarrhea, constipation, blood in stool, anal bleeding and rectal pain.  Genitourinary: Negative for dysuria, urgency, difficulty urinating and testicular pain.  Musculoskeletal: Positive for myalgias and back pain. Negative for arthralgias and gait problem.  Skin: Negative for color change, pallor, rash  and wound.  Neurological: Negative for dizziness, speech difficulty, weakness, numbness and headaches.  Hematological: Negative for adenopathy. Does not bruise/bleed easily.  Psychiatric/Behavioral: Negative for hallucinations, confusion and agitation.       Objective:   Physical Exam  Constitutional: He is oriented to person, place, and time. He appears well-developed and well-nourished. No distress.  HENT:  Head: Normocephalic.  Mouth/Throat: Oropharynx is clear and moist. No oropharyngeal exudate.  Eyes: Conjunctivae normal and EOM are normal. Pupils are equal, round, and reactive to light. No scleral icterus.  Neck: Normal range of motion. Neck supple. No tracheal deviation present.  Cardiovascular: Normal rate, regular rhythm and intact distal pulses.   Pulmonary/Chest: Effort normal and breath sounds normal. No respiratory distress. He has no rales. He exhibits no tenderness.  Abdominal: Soft. He exhibits no distension and no mass. There is no tenderness. There is no guarding. Hernia confirmed negative in the right inguinal area and confirmed negative in the left inguinal area.  Musculoskeletal: Normal range of motion. He exhibits no tenderness.  Lymphadenopathy:    He has no cervical adenopathy.       Right: No inguinal adenopathy present.       Left: No inguinal adenopathy present.  Neurological: He is alert and oriented to person, place, and time. No cranial nerve deficit. He exhibits normal muscle tone. Coordination normal.  Skin: Skin is warm and dry. No rash noted. He is not diaphoretic. No erythema. No pallor.  Psychiatric: He has a normal mood and affect. His behavior is normal. Judgment and thought content normal.       Assessment:     Postprandial epigastric pain suspicious for biliary colic especially in the setting of relatively normal endoscopy and no improvement on PPIs.    Plan:     I think he would benefit from cholecystectomy.  Had a long discussion with him  about differential diagnosis and interventions.  I cautioned him this is not 100% chance of salts all problems.  However, he started and seen by Dr. Melanee Spry and has not had improvement in symptoms with PPIs.  He has had a negative cardiac workup early in the year.  Pretty good exercise tolerance.  I discussed the procedure with him:  The anatomy & physiology of hepatobiliary & pancreatic function was discussed. **Note De-Identified Justen Obfuscation** The pathophysiology of gallbladder dysfunction was discussed.  Natural history risks without surgery was discussed.   I feel the risks of no intervention will lead to serious problems that outweigh the operative risks; therefore, I recommended cholecystectomy to remove the pathology.  I explained laparoscopic techniques with possible need for an open approach.  Probable cholangiogram to evaluate the bilary tract was explained as well.    Risks such as bleeding, infection, abscess, leak, injury to other organs, need for further treatment, heart attack, death, and other risks were discussed.  I noted a good likelihood this will help address the problem.  Possibility that this will not correct all abdominal symptoms was explained.  Goals of post-operative recovery were discussed as well.  We will work to minimize complications.  An educational handout further explaining the pathology and treatment options was given as well.  Questions were answered.  The patient expresses understanding & wishes to proceed with surgery.

## 2012-03-17 ENCOUNTER — Other Ambulatory Visit (INDEPENDENT_AMBULATORY_CARE_PROVIDER_SITE_OTHER): Payer: Self-pay | Admitting: Surgery

## 2012-03-17 DIAGNOSIS — K8010 Calculus of gallbladder with chronic cholecystitis without obstruction: Secondary | ICD-10-CM

## 2012-03-17 HISTORY — PX: CHOLECYSTECTOMY: SHX55

## 2012-03-20 ENCOUNTER — Other Ambulatory Visit: Payer: Self-pay

## 2012-03-20 ENCOUNTER — Observation Stay (HOSPITAL_COMMUNITY)
Admission: EM | Admit: 2012-03-20 | Discharge: 2012-03-22 | DRG: 208 | Disposition: A | Discharge status: Home / Self Care | Payer: BC Managed Care – PPO | Attending: Surgery | Admitting: Surgery

## 2012-03-20 ENCOUNTER — Encounter (HOSPITAL_COMMUNITY): Payer: Self-pay | Admitting: *Deleted

## 2012-03-20 ENCOUNTER — Emergency Department (HOSPITAL_COMMUNITY): Payer: BC Managed Care – PPO

## 2012-03-20 DIAGNOSIS — K838 Other specified diseases of biliary tract: Principal | ICD-10-CM | POA: Insufficient documentation

## 2012-03-20 DIAGNOSIS — R072 Precordial pain: Secondary | ICD-10-CM | POA: Diagnosis present

## 2012-03-20 DIAGNOSIS — I10 Essential (primary) hypertension: Secondary | ICD-10-CM | POA: Diagnosis present

## 2012-03-20 DIAGNOSIS — Z79899 Other long term (current) drug therapy: Secondary | ICD-10-CM | POA: Insufficient documentation

## 2012-03-20 DIAGNOSIS — K8050 Calculus of bile duct without cholangitis or cholecystitis without obstruction: Secondary | ICD-10-CM | POA: Diagnosis present

## 2012-03-20 DIAGNOSIS — R109 Unspecified abdominal pain: Secondary | ICD-10-CM

## 2012-03-20 DIAGNOSIS — J45909 Unspecified asthma, uncomplicated: Secondary | ICD-10-CM | POA: Insufficient documentation

## 2012-03-20 DIAGNOSIS — K219 Gastro-esophageal reflux disease without esophagitis: Secondary | ICD-10-CM | POA: Insufficient documentation

## 2012-03-20 DIAGNOSIS — E782 Mixed hyperlipidemia: Secondary | ICD-10-CM | POA: Insufficient documentation

## 2012-03-20 DIAGNOSIS — H905 Unspecified sensorineural hearing loss: Secondary | ICD-10-CM | POA: Insufficient documentation

## 2012-03-20 LAB — CBC WITH DIFFERENTIAL/PLATELET
Basophils Relative: 1 % (ref 0–1)
Eosinophils Absolute: 0.2 10*3/uL (ref 0.0–0.7)
Eosinophils Relative: 2 % (ref 0–5)
HCT: 44.5 % (ref 39.0–52.0)
Hemoglobin: 15.4 g/dL (ref 13.0–17.0)
Lymphs Abs: 2.5 10*3/uL (ref 0.7–4.0)
MCH: 30.6 pg (ref 26.0–34.0)
MCHC: 34.6 g/dL (ref 30.0–36.0)
MCV: 88.3 fL (ref 78.0–100.0)
Monocytes Absolute: 1.1 10*3/uL — ABNORMAL HIGH (ref 0.1–1.0)
Monocytes Relative: 9 % (ref 3–12)
Neutrophils Relative %: 70 % (ref 43–77)
RBC: 5.04 MIL/uL (ref 4.22–5.81)

## 2012-03-20 LAB — COMPREHENSIVE METABOLIC PANEL
Albumin: 4.6 g/dL (ref 3.5–5.2)
Alkaline Phosphatase: 103 U/L (ref 39–117)
BUN: 15 mg/dL (ref 6–23)
Creatinine, Ser: 1.19 mg/dL (ref 0.50–1.35)
GFR calc Af Amer: 86 mL/min — ABNORMAL LOW (ref >90)
Glucose, Bld: 120 mg/dL — ABNORMAL HIGH (ref 70–99)
Potassium: 3.7 mEq/L (ref 3.5–5.1)
Total Protein: 8.3 g/dL (ref 6.0–8.3)

## 2012-03-20 LAB — URINALYSIS, ROUTINE W REFLEX MICROSCOPIC
Ketones, ur: NEGATIVE mg/dL
Leukocytes, UA: NEGATIVE
Nitrite: NEGATIVE
Specific Gravity, Urine: 1.027 (ref 1.005–1.030)
Urobilinogen, UA: 4 mg/dL — ABNORMAL HIGH (ref 0.0–1.0)

## 2012-03-20 LAB — LIPASE, BLOOD: Lipase: 30 U/L (ref 11–59)

## 2012-03-20 LAB — URINE MICROSCOPIC-ADD ON

## 2012-03-20 LAB — POCT I-STAT TROPONIN I

## 2012-03-20 MED ORDER — MORPHINE SULFATE 2 MG/ML IJ SOLN
2.0000 mg | INTRAMUSCULAR | Status: DC | PRN
  Administered 2012-03-20: 2 mg via INTRAVENOUS
  Administered 2012-03-21: 4 mg via INTRAVENOUS
  Filled 2012-03-20: qty 1
  Filled 2012-03-20: qty 2

## 2012-03-20 MED ORDER — HYDROCHLOROTHIAZIDE 25 MG PO TABS
25.0000 mg | ORAL_TABLET | Freq: Every day | ORAL | Status: DC
  Administered 2012-03-21 – 2012-03-22 (×2): 25 mg via ORAL
  Filled 2012-03-20 (×2): qty 1

## 2012-03-20 MED ORDER — NEBIVOLOL HCL 5 MG PO TABS
5.0000 mg | ORAL_TABLET | Freq: Every day | ORAL | Status: DC
  Administered 2012-03-21 – 2012-03-22 (×2): 5 mg via ORAL
  Filled 2012-03-20 (×2): qty 1

## 2012-03-20 MED ORDER — KCL IN DEXTROSE-NACL 20-5-0.45 MEQ/L-%-% IV SOLN
INTRAVENOUS | Status: DC
  Administered 2012-03-21 (×2): via INTRAVENOUS
  Filled 2012-03-20 (×6): qty 1000

## 2012-03-20 MED ORDER — AMLODIPINE BESYLATE 10 MG PO TABS
10.0000 mg | ORAL_TABLET | Freq: Every day | ORAL | Status: DC
  Administered 2012-03-21 – 2012-03-22 (×2): 10 mg via ORAL
  Filled 2012-03-20 (×2): qty 1

## 2012-03-20 MED ORDER — AMLODIPINE-VALSARTAN-HCTZ 10-320-25 MG PO TABS: 1.0000 | ORAL_TABLET | Freq: Every day | ORAL | Status: DC

## 2012-03-20 MED ORDER — ONDANSETRON HCL 4 MG/2ML IJ SOLN
4.0000 mg | Freq: Four times a day (QID) | INTRAMUSCULAR | Status: DC | PRN
  Administered 2012-03-20: 4 mg via INTRAVENOUS
  Filled 2012-03-20: qty 2

## 2012-03-20 MED ORDER — PANTOPRAZOLE SODIUM 40 MG IV SOLR
40.0000 mg | Freq: Every day | INTRAVENOUS | Status: DC
  Administered 2012-03-21: 40 mg via INTRAVENOUS
  Filled 2012-03-20 (×2): qty 40

## 2012-03-20 MED ORDER — IRBESARTAN 300 MG PO TABS
300.0000 mg | ORAL_TABLET | Freq: Every day | ORAL | Status: DC
  Administered 2012-03-21 – 2012-03-22 (×2): 300 mg via ORAL
  Filled 2012-03-20 (×2): qty 1

## 2012-03-20 MED ORDER — HYDROMORPHONE HCL PF 1 MG/ML IJ SOLN
1.0000 mg | Freq: Once | INTRAMUSCULAR | Status: AC
  Administered 2012-03-20: 1 mg via INTRAVENOUS
  Filled 2012-03-20: qty 1

## 2012-03-20 MED ORDER — BUDESONIDE-FORMOTEROL FUMARATE 160-4.5 MCG/ACT IN AERO
2.0000 | INHALATION_SPRAY | Freq: Two times a day (BID) | RESPIRATORY_TRACT | Status: DC
  Filled 2012-03-20: qty 6

## 2012-03-20 NOTE — ED Notes (Signed)
**Note De-Identified Danko Obfuscation** Pt c/o abd pain, reports he just had gallbladder removed and is schedule to has sx tmrw to remove gall stones in ducts that were left behind. Pt reports pain has progressively gotten worse and pain increases after eating.

## 2012-03-20 NOTE — ED Notes (Signed)
**Note De-Identified Giovanni Obfuscation** Pt had gallbladder out on Friday..  Pt is scheduled to have gallstones removed from bile duct tomorrow.  Pt states ate lunch at 1300 and then started having pain to mid upper abd quad and epigastric are.  No nausea.  Pt is pale.  Pt reports some sob.  Pt states some mid back pain and has pain with inspiration

## 2012-03-20 NOTE — ED Provider Notes (Signed)
**Note De-Identified Mikowski Obfuscation** Derek Donaldson is a 41 y.o. male here today for abdominal pain. Pain started after lunch. It is ongoing. The pain is severe. He's been trying to a year to the recommended diet postoperatively. His surgery was done at an outlying center. He is due for ERCP in the morning for retained common duct stones. He denies cough, or shortness of breath. He has not had a fever. He has not improved, with pain medicine given here.  On exam, the abdomen is tender, right upper quad.   Evaluation is consistent with obstructed common bile, secondary to retained gallstone. Postoperatively. Patient needs evaluation by general surgery and gastroenterology. He will need to be admitted.   Medical screening examination/treatment/procedure(s) were conducted as a shared visit with non-physician practitioner(s) and myself.  I personally evaluated the patient during the encounter  Flint Melter, MD 03/21/12 [phone]

## 2012-03-20 NOTE — ED Provider Notes (Signed)
**Note De-Identified Ratledge Obfuscation** History     CSN: [id number]  Arrival date & time 03/20/12  2952   First MD Initiated Contact with Patient 03/20/12 1911      Chief Complaint  Patient presents with  . Abdominal Pain    (Consider location/radiation/quality/duration/timing/severity/associated sxs/prior treatment) HPI Comments: 41 year old male presents to the emergency department with his wife complaining of sudden onset epigastric abdominal pain after eating lunch around 1:00 today. Describes the pain as sharp, constant radiating to his back rated 10 out of 10. He try taking Tylenol without relief. He had cholecystectomy performed on Friday by Dr. Michaell Cowing and is scheduled for an ERCP tomorrow with Dr. Ewing Schlein. He did not have any problems post surgery until today. States he had one bowel movement today, however he had to strain. He has not taken any narcotics since Friday. The pain is causing him to become short of breath. Denies fever or chills. No nausea or vomiting. Wife states his abdomen looks "swollen".  Patient is a 41 y.o. male presenting with abdominal pain. The history is provided by the patient and the spouse.  Abdominal Pain The primary symptoms of the illness include abdominal pain and shortness of breath. The primary symptoms of the illness do not include fever, nausea or vomiting.  Additional symptoms associated with the illness include back pain. Symptoms associated with the illness do not include chills.    Past Medical History  Diagnosis Date  . Essential hypertension, benign   . Mixed hyperlipidemia   . Sensorineural hearing loss of low frequency   . Asthma   . GERD (gastroesophageal reflux disease)     Past Surgical History  Procedure Date  . 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 Vasectomy   . Cholecystectomy     Family History  Problem Relation Age of Onset  . Stroke Paternal Grandfather   . Cancer Maternal Grandfather     unknown type  . Asthma Mother   . Asthma  Brother   . Heart failure Maternal Grandmother   . Aneurysm Father     History  Substance Use Topics  . Smoking status: Never Smoker   . Smokeless tobacco: Never Used  . Alcohol Use: No      Review of Systems  Constitutional: Negative for fever and chills.  HENT: Negative.   Respiratory: Positive for shortness of breath.   Cardiovascular: Negative for chest pain.  Gastrointestinal: Positive for abdominal pain. Negative for nausea and vomiting.  Genitourinary: Negative.   Musculoskeletal: Positive for back pain.  Skin: Negative.   Neurological: Negative.   Hematological: Negative.   Psychiatric/Behavioral: The patient is nervous/anxious.     Allergies  Review of patient's allergies indicates no known allergies.  Home Medications   Current Outpatient Rx  Name  Route  Sig  Dispense  Refill  . ACETAMINOPHEN 500 MG PO TABS   Oral   Take 1,000 mg by mouth every 6 (six) hours as needed. For pain         . AMLODIPINE-VALSARTAN-HCTZ 10-320-25 MG PO TABS   Oral   Take 1 tablet by mouth daily.         . ASPIRIN EC 81 MG PO TBEC   Oral   Take 81 mg by mouth daily.         . BUDESONIDE-FORMOTEROL FUMARATE 160-4.5 MCG/ACT IN AERO   Inhalation   Inhale 2 puffs into the lungs 2 (two) times daily.         . DEXLANSOPRAZOLE 60 **Note De-Identified Sabol Obfuscation** MG PO CPDR   Oral   Take 60 mg by mouth daily.         . FENOFIBRATE MICRONIZED 130 MG PO CAPS   Oral   Take 130 mg by mouth daily before breakfast.         . COLON CLEANSER PO   Oral   Take 1 capsule by mouth daily as needed. For constipation         . NEBIVOLOL HCL 5 MG PO TABS   Oral   Take 5 mg by mouth daily.         Marland Kitchen PRILOSEC OTC PO   Oral   Take 1 tablet by mouth daily.           BP 143/84  Pulse 80  Temp 97.3 F (36.3 C) (Oral)  Resp 22  SpO2 97%  Physical Exam  Constitutional: He is oriented to person, place, and time. He appears well-developed and well-nourished. He appears distressed (laying iwth knees  bent holding the upper part of his abdomen and grunting with pain).       Overweight  HENT:  Head: Normocephalic and atraumatic.  Eyes: Conjunctivae normal and EOM are normal.  Neck: Normal range of motion. Neck supple.  Cardiovascular: Normal rate, regular rhythm and normal heart sounds.   Pulmonary/Chest: Tachypnea noted. He has no decreased breath sounds.  Abdominal: Bowel sounds are normal. He exhibits distension (mild). There is generalized tenderness (diffuse, worse in mid-epigastric region). There is rigidity and guarding.  Musculoskeletal: Normal range of motion. He exhibits no edema.  Neurological: He is alert and oriented to person, place, and time.  Skin: Skin is warm and dry.     Psychiatric: His behavior is normal. His mood appears anxious. His speech is rapid and/or pressured.    ED Course  Procedures (including critical care time)  Labs Reviewed  CBC WITH DIFFERENTIAL - Abnormal; Notable for the following:    WBC 13.3 (*)     Neutro Abs 9.3 (*)     Monocytes Absolute 1.1 (*)     All other components within normal limits  COMPREHENSIVE METABOLIC PANEL - Abnormal; Notable for the following:    Glucose, Bld 120 (*)     AST 121 (*)     ALT 132 (*)     GFR calc non Af Amer 74 (*)     GFR calc Af Amer 86 (*)     All other components within normal limits  URINALYSIS, ROUTINE W REFLEX MICROSCOPIC - Abnormal; Notable for the following:    APPearance TURBID (*)     Bilirubin Urine SMALL (*)     Urobilinogen, UA 4.0 (*)     All other components within normal limits  LIPASE, BLOOD  POCT I-STAT TROPONIN I  URINE MICROSCOPIC-ADD ON   Dg Abd Acute W/chest  03/20/2012  *RADIOLOGY REPORT*  Clinical Data: Chest pain/epigastric abdominal pain that extends to left side of abdomen, HX: GERD, gall bladder removed 03-17-12  ACUTE ABDOMEN SERIES (ABDOMEN 2 VIEW & CHEST 1 VIEW)  Comparison: Multiple exams, including 02/17/2012  Findings: Primarily band-like opacities are present along  both hemidiaphragms.  Low lung volumes noted.  Mild cardiomegaly may be partially attributable to these low lung volumes.  No free peroneal gas is observed beneath the hemidiaphragms.  Right upper quadrant clips likely reflect prior cholecystectomy.  Gas and stool noted in nondilated colon.  Paucity of bowel gas.  No compelling findings of ascites.  IMPRESSION:  1.  Low **Note De-Identified Becraft Obfuscation** lung volumes with band-like opacities at the lung bases favoring atelectasis or pneumonia. 2.  Mild cardiomegaly. 3.  No specific abnormality of bowel gas pattern is observed, although there is a paucity of gas in the small bowel.   Original Report Authenticated By: Gaylyn Rong, M.D.      1. Abdominal pain       MDM  Patient being admitted to surgery.        Trevor Mace, PA-C 03/20/12 2331

## 2012-03-20 NOTE — H&P (Signed)
**Note De-Identified Feria Obfuscation** Derek Donaldson is an 41 y.o. male.   Chief Complaint: Epigastric abdominal pain HPI: This is a 41 yo male who is POD #3 from a single-site laparoscopic cholecystectomy with cholangiogram by Dr. Estelle Grumbles at [facility].  He was doing well post-operatively with minimal pain med usage.  He began eating solid food today and had sudden onset of severe epigastric pain.  One normal bowel movement today after taking laxative.  He presented to the emergency department for evaluation.  He was apparently found to have a CBD stone on the intraoperative cholangiogram, and was scheduled to have an ERCP by Dr. Vida Rigger tomorrow at [facility].  However, he came to the emergency room at [facility].    These symptoms are similar to the attacks that he was having prior to surgery.  He has undergone an extensive work-up, which is documented in Dr. Michaell Cowing' office note.  Past Medical History  Diagnosis Date  . Essential hypertension, benign   . Mixed hyperlipidemia   . Sensorineural hearing loss of low frequency   . Asthma   . GERD (gastroesophageal reflux disease)     Past Surgical History  Procedure Date  . 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 Vasectomy   . Cholecystectomy     Family History  Problem Relation Age of Onset  . Stroke Paternal Grandfather   . Cancer Maternal Grandfather     unknown type  . Asthma Mother   . Asthma Brother   . Heart failure Maternal Grandmother   . Aneurysm Father    Social History:  reports that he has never smoked. He has never used smokeless tobacco. He reports that he does not drink alcohol or use illicit drugs.  Allergies: No Known Allergies  Current Outpatient Prescriptions   Medication  Sig  Dispense  Refill   .  acetaminophen (TYLENOL) 500 MG tablet  Take 1,000 mg by mouth every 6 (six) hours as needed. For pain     .  Alum & Mag Hydroxide-Simeth (GI COCKTAIL) SUSP suspension  Take 15 mLs by mouth 3 (three) times daily. Shake well.  200 mL  0     .  Amlodipine-Valsartan-HCTZ 10-320-25 MG TABS  Take 1 tablet by mouth daily.     Marland Kitchen  aspirin EC 81 MG tablet  Take 81 mg by mouth daily.     .  budesonide-formoterol (SYMBICORT) 160-4.5 MCG/ACT inhaler  Inhale 2 puffs into the lungs 2 (two) times daily.     Marland Kitchen  dexlansoprazole (DEXILANT) 60 MG capsule  Take 60 mg by mouth daily.     .  fenofibrate micronized (ANTARA) 130 MG capsule  Take 130 mg by mouth daily before breakfast.     .  nebivolol (BYSTOLIC) 5 MG tablet  Take 5 mg by mouth daily.     Marland Kitchen  oxyCODONE-acetaminophen (PERCOCET/ROXICET) 5-325 MG per tablet  1 to 2 tabs PO q6hrs PRN for pain        Results for orders placed during the hospital encounter of 03/20/12 (from the past 48 hour(s))  CBC WITH DIFFERENTIAL     Status: Abnormal   Collection Time   03/20/12  7:13 PM      Component Value Range Comment   WBC 13.3 (*) 4.0 - 10.5 K/uL    RBC 5.04  4.22 - 5.81 MIL/uL    Hemoglobin 15.4  13.0 - 17.0 g/dL    HCT 40.9  81.1 - 91.4 % **Note De-Identified Malerba Obfuscation** MCV 88.3  78.0 - 100.0 fL    MCH 30.6  26.0 - 34.0 pg    MCHC 34.6  30.0 - 36.0 g/dL    RDW 40.9  81.1 - 91.4 %    Platelets 316  150 - 400 K/uL    Neutrophils Relative 70  43 - 77 %    Neutro Abs 9.3 (*) 1.7 - 7.7 K/uL    Lymphocytes Relative 19  12 - 46 %    Lymphs Abs 2.5  0.7 - 4.0 K/uL    Monocytes Relative 9  3 - 12 %    Monocytes Absolute 1.1 (*) 0.1 - 1.0 K/uL    Eosinophils Relative 2  0 - 5 %    Eosinophils Absolute 0.2  0.0 - 0.7 K/uL    Basophils Relative 1  0 - 1 %    Basophils Absolute 0.1  0.0 - 0.1 K/uL   COMPREHENSIVE METABOLIC PANEL     Status: Abnormal   Collection Time   03/20/12  7:13 PM      Component Value Range Comment   Sodium 141  135 - 145 mEq/L    Potassium 3.7  3.5 - 5.1 mEq/L    Chloride 103  96 - 112 mEq/L    CO2 26  19 - 32 mEq/L    Glucose, Bld 120 (*) 70 - 99 mg/dL    BUN 15  6 - 23 mg/dL    Creatinine, Ser 7.82  0.50 - 1.35 mg/dL    Calcium 95.6  8.4 - 10.5 mg/dL    Total Protein 8.3  6.0 - 8.3 g/dL     Albumin 4.6  3.5 - 5.2 g/dL    AST 213 (*) 0 - 37 U/L    ALT 132 (*) 0 - 53 U/L    Alkaline Phosphatase 103  39 - 117 U/L    Total Bilirubin 1.1  0.3 - 1.2 mg/dL    GFR calc non Af Amer 74 (*) >90 mL/min    GFR calc Af Amer 86 (*) >90 mL/min   LIPASE, BLOOD     Status: Normal   Collection Time   03/20/12  7:13 PM      Component Value Range Comment   Lipase 30  11 - 59 U/L   POCT I-STAT TROPONIN I     Status: Normal   Collection Time   03/20/12  7:30 PM      Component Value Range Comment   Troponin i, poc 0.01  0.00 - 0.08 ng/mL    Comment 3            URINALYSIS, ROUTINE W REFLEX MICROSCOPIC     Status: Abnormal   Collection Time   03/20/12  8:16 PM      Component Value Range Comment   Color, Urine YELLOW  YELLOW    APPearance TURBID (*) CLEAR    Specific Gravity, Urine 1.027  1.005 - 1.030    pH 7.0  5.0 - 8.0    Glucose, UA NEGATIVE  NEGATIVE mg/dL    Hgb urine dipstick NEGATIVE  NEGATIVE    Bilirubin Urine SMALL (*) NEGATIVE    Ketones, ur NEGATIVE  NEGATIVE mg/dL    Protein, ur NEGATIVE  NEGATIVE mg/dL    Urobilinogen, UA 4.0 (*) 0.0 - 1.0 mg/dL    Nitrite NEGATIVE  NEGATIVE    Leukocytes, UA NEGATIVE  NEGATIVE   URINE MICROSCOPIC-ADD ON     Status: Normal   Collection Time **Note De-Identified Tello Obfuscation** 03/20/12  8:16 PM      Component Value Range Comment   Squamous Epithelial / LPF RARE  RARE    WBC, UA 0-2  <3 WBC/hpf    RBC / HPF 0-2  <3 RBC/hpf    Bacteria, UA RARE  RARE    Urine-Other AMORPHOUS URATES/PHOSPHATES      Dg Abd Acute W/chest  03/20/2012  *RADIOLOGY REPORT*  Clinical Data: Chest pain/epigastric abdominal pain that extends to left side of abdomen, HX: GERD, gall bladder removed 03-17-12  ACUTE ABDOMEN SERIES (ABDOMEN 2 VIEW & CHEST 1 VIEW)  Comparison: Multiple exams, including 02/17/2012  Findings: Primarily band-like opacities are present along both hemidiaphragms.  Low lung volumes noted.  Mild cardiomegaly may be partially attributable to these low lung volumes.  No free peroneal  gas is observed beneath the hemidiaphragms.  Right upper quadrant clips likely reflect prior cholecystectomy.  Gas and stool noted in nondilated colon.  Paucity of bowel gas.  No compelling findings of ascites.  IMPRESSION:  1.  Low lung volumes with band-like opacities at the lung bases favoring atelectasis or pneumonia. 2.  Mild cardiomegaly. 3.  No specific abnormality of bowel gas pattern is observed, although there is a paucity of gas in the small bowel.   Original Report Authenticated By: Gaylyn Rong, M.D.     Review of Systems  Eyes: Negative.   Gastrointestinal: Positive for heartburn and abdominal pain.  Neurological: Negative.   Endo/Heme/Allergies: Negative.     Blood pressure 143/84, pulse 80, temperature 97.3 F (36.3 C), temperature source Oral, resp. rate 22, SpO2 97.00%. Physical Exam  WDWN in NAD HEENT:  EOMI, sclera anicteric Neck:  No masses, no thyromegaly Lungs:  CTA bilaterally; normal respiratory effort CV:  Regular rate and rhythm; no murmurs Abd:  +bowel sounds, soft, healing umbilical incision with some surrounding bruising Mildly tender in epigastrium Ext:  Well-perfused; no edema Skin:  Warm, dry; no sign of jaundice   Assessment/Plan Epigastric pain - recurrent episodes S/p laparoscopic cholecystectomy Choledocholithiasis  Plan: Admit for IV hydration/ pain control I have spoken with [facility] to let him know that he was being admitted at [facility], instead of [facility]. Ultrasound to rule out fluid collection/ biloma.   Sameer Teeple K. 03/20/2012, 9:18 PM

## 2012-03-21 ENCOUNTER — Encounter (HOSPITAL_COMMUNITY): Payer: Self-pay | Admitting: Gastroenterology

## 2012-03-21 ENCOUNTER — Encounter (HOSPITAL_COMMUNITY)
Admission: EM | Disposition: A | Discharge status: Home / Self Care | Payer: Self-pay | Source: Home / Self Care | Attending: Emergency Medicine

## 2012-03-21 ENCOUNTER — Inpatient Hospital Stay (HOSPITAL_COMMUNITY): Payer: BC Managed Care – PPO | Admitting: Anesthesiology

## 2012-03-21 ENCOUNTER — Ambulatory Visit (HOSPITAL_COMMUNITY)
Admission: RE | Admit: 2012-03-21 | Payer: BC Managed Care – PPO | Source: Ambulatory Visit | Admitting: Gastroenterology

## 2012-03-21 ENCOUNTER — Inpatient Hospital Stay (HOSPITAL_COMMUNITY): Payer: BC Managed Care – PPO

## 2012-03-21 ENCOUNTER — Encounter (HOSPITAL_COMMUNITY): Payer: Self-pay | Admitting: Anesthesiology

## 2012-03-21 DIAGNOSIS — K8050 Calculus of bile duct without cholangitis or cholecystitis without obstruction: Secondary | ICD-10-CM | POA: Diagnosis present

## 2012-03-21 HISTORY — PX: ERCP: SHX5425

## 2012-03-21 LAB — COMPREHENSIVE METABOLIC PANEL
ALT: 215 U/L — ABNORMAL HIGH (ref 0–53)
AST: 225 U/L — ABNORMAL HIGH (ref 0–37)
Alkaline Phosphatase: 90 U/L (ref 39–117)
CO2: 24 mEq/L (ref 19–32)
Chloride: 102 mEq/L (ref 96–112)
GFR calc non Af Amer: 79 mL/min — ABNORMAL LOW (ref >90)
Potassium: 3.5 mEq/L (ref 3.5–5.1)
Sodium: 138 mEq/L (ref 135–145)
Total Bilirubin: 2 mg/dL — ABNORMAL HIGH (ref 0.3–1.2)

## 2012-03-21 LAB — CBC
Hemoglobin: 13.7 g/dL (ref 13.0–17.0)
MCH: 30 pg (ref 26.0–34.0)
MCHC: 34.3 g/dL (ref 30.0–36.0)
Platelets: 287 10*3/uL (ref 150–400)

## 2012-03-21 LAB — TYPE AND SCREEN: ABO/RH(D): O POS

## 2012-03-21 LAB — ABO/RH: ABO/RH(D): O POS

## 2012-03-21 SURGERY — ERCP, WITH INTERVENTION IF INDICATED
Anesthesia: General

## 2012-03-21 MED ORDER — FENTANYL CITRATE 0.05 MG/ML IJ SOLN
INTRAMUSCULAR | Status: DC | PRN
  Administered 2012-03-21: 100 ug via INTRAVENOUS
  Administered 2012-03-21: 50 ug via INTRAVENOUS

## 2012-03-21 MED ORDER — CISATRACURIUM BESYLATE (PF) 10 MG/5ML IV SOLN
INTRAVENOUS | Status: DC | PRN
  Administered 2012-03-21: 8 mg via INTRAVENOUS

## 2012-03-21 MED ORDER — DEXTROSE 5 % IV SOLN
1.0000 g | Freq: Two times a day (BID) | INTRAVENOUS | Status: DC
  Filled 2012-03-21 (×4): qty 1

## 2012-03-21 MED ORDER — ALUM & MAG HYDROXIDE-SIMETH 200-200-20 MG/5ML PO SUSP
30.0000 mL | Freq: Four times a day (QID) | ORAL | Status: DC | PRN
  Filled 2012-03-21: qty 30

## 2012-03-21 MED ORDER — BLISTEX EX OINT
TOPICAL_OINTMENT | Freq: Two times a day (BID) | CUTANEOUS | Status: DC
  Filled 2012-03-21: qty 10

## 2012-03-21 MED ORDER — PROPOFOL 10 MG/ML IV EMUL
INTRAVENOUS | Status: DC | PRN
  Administered 2012-03-21: 200 mg via INTRAVENOUS

## 2012-03-21 MED ORDER — MAGNESIUM HYDROXIDE 400 MG/5ML PO SUSP
30.0000 mL | Freq: Two times a day (BID) | ORAL | Status: DC | PRN
  Filled 2012-03-21: qty 30

## 2012-03-21 MED ORDER — DIPHENHYDRAMINE HCL 50 MG/ML IJ SOLN: 12.5000 mg | Freq: Four times a day (QID) | INTRAMUSCULAR | Status: DC | PRN

## 2012-03-21 MED ORDER — MIDAZOLAM HCL 5 MG/5ML IJ SOLN
INTRAMUSCULAR | Status: DC | PRN
  Administered 2012-03-21 (×2): 1 mg via INTRAVENOUS

## 2012-03-21 MED ORDER — SODIUM CHLORIDE 0.9 % IJ SOLN: 3.0000 mL | INTRAMUSCULAR | Status: DC | PRN

## 2012-03-21 MED ORDER — SODIUM CHLORIDE 0.9 % IV SOLN: INTRAVENOUS | Status: DC

## 2012-03-21 MED ORDER — NEOSTIGMINE METHYLSULFATE 1 MG/ML IJ SOLN
INTRAMUSCULAR | Status: DC | PRN
  Administered 2012-03-21: 5 mg via INTRAVENOUS

## 2012-03-21 MED ORDER — MAGIC MOUTHWASH
15.0000 mL | Freq: Four times a day (QID) | ORAL | Status: DC | PRN
  Filled 2012-03-21: qty 15

## 2012-03-21 MED ORDER — SODIUM CHLORIDE 0.9 % IV SOLN
INTRAVENOUS | Status: DC | PRN
  Administered 2012-03-21: 10:00:00

## 2012-03-21 MED ORDER — ONDANSETRON HCL 4 MG/2ML IJ SOLN
INTRAMUSCULAR | Status: DC | PRN
  Administered 2012-03-21 (×2): 2 mg via INTRAVENOUS

## 2012-03-21 MED ORDER — SODIUM CHLORIDE 0.9 % IJ SOLN: 3.0000 mL | Freq: Two times a day (BID) | INTRAMUSCULAR | Status: DC

## 2012-03-21 MED ORDER — PANTOPRAZOLE SODIUM 40 MG PO TBEC
40.0000 mg | DELAYED_RELEASE_TABLET | Freq: Every day | ORAL | Status: DC
  Administered 2012-03-21: 40 mg via ORAL
  Filled 2012-03-21 (×3): qty 1

## 2012-03-21 MED ORDER — SODIUM CHLORIDE 0.9 % IV SOLN: 250.0000 mL | INTRAVENOUS | Status: DC | PRN

## 2012-03-21 MED ORDER — LACTATED RINGERS IV SOLN
INTRAVENOUS | Status: DC | PRN
  Administered 2012-03-21 (×2): via INTRAVENOUS

## 2012-03-21 MED ORDER — SUCCINYLCHOLINE CHLORIDE 20 MG/ML IJ SOLN
INTRAMUSCULAR | Status: DC | PRN
  Administered 2012-03-21: 100 mg via INTRAVENOUS

## 2012-03-21 MED ORDER — FENTANYL CITRATE 0.05 MG/ML IJ SOLN: 25.0000 ug | INTRAMUSCULAR | Status: DC | PRN

## 2012-03-21 MED ORDER — FENOFIBRATE 160 MG PO TABS
160.0000 mg | ORAL_TABLET | Freq: Every day | ORAL | Status: DC
  Filled 2012-03-21 (×2): qty 1

## 2012-03-21 MED ORDER — PROMETHAZINE HCL 25 MG/ML IJ SOLN: 12.5000 mg | Freq: Four times a day (QID) | INTRAMUSCULAR | Status: DC | PRN

## 2012-03-21 MED ORDER — LACTATED RINGERS IV SOLN: INTRAVENOUS | Status: DC

## 2012-03-21 MED ORDER — GLYCOPYRROLATE 0.2 MG/ML IJ SOLN
INTRAMUSCULAR | Status: DC | PRN
  Administered 2012-03-21: .8 mg via INTRAVENOUS

## 2012-03-21 MED ORDER — LIP MEDEX EX OINT
1.0000 "application " | TOPICAL_OINTMENT | Freq: Two times a day (BID) | CUTANEOUS | Status: DC
  Filled 2012-03-21 (×2): qty 7

## 2012-03-21 MED ORDER — OXYCODONE HCL 5 MG PO TABS: 5.0000 mg | ORAL_TABLET | ORAL | Status: DC | PRN

## 2012-03-21 MED ORDER — LIDOCAINE HCL (CARDIAC) 20 MG/ML IV SOLN
INTRAVENOUS | Status: DC | PRN
  Administered 2012-03-21: 100 mg via INTRAVENOUS

## 2012-03-21 MED ORDER — LACTATED RINGERS IV BOLUS (SEPSIS): 1000.0000 mL | Freq: Three times a day (TID) | INTRAVENOUS | Status: DC | PRN

## 2012-03-21 MED ORDER — DEXAMETHASONE SODIUM PHOSPHATE 10 MG/ML IJ SOLN
INTRAMUSCULAR | Status: DC | PRN
  Administered 2012-03-21: 10 mg via INTRAVENOUS

## 2012-03-21 MED ORDER — DEXTROSE 5 % IV SOLN
1.0000 g | INTRAVENOUS | Status: DC | PRN
  Administered 2012-03-21: 1 g via INTRAVENOUS

## 2012-03-21 NOTE — Anesthesia Postprocedure Evaluation (Signed)
**Note De-identified Love Obfuscation**  **Note De-Identified Brugh Obfuscation** Anesthesia Post-op Note  Patient: Derek Donaldson  Procedure(s) Performed: Procedure(s) (LRB): ENDOSCOPIC RETROGRADE CHOLANGIOPANCREATOGRAPHY (ERCP) (N/A)  Patient Location: PACU  Anesthesia Type: General  Level of Consciousness: awake and alert   Airway and Oxygen Therapy: Patient Spontanous Breathing  Post-op Pain: mild  Post-op Assessment: Post-op Vital signs reviewed, Patient's Cardiovascular Status Stable, Respiratory Function Stable, Patent Airway and No signs of Nausea or vomiting  Post-op Vital Signs: stable  Complications: No apparent anesthesia complications

## 2012-03-21 NOTE — Progress Notes (Signed)
**Note De-Identified Kirshenbaum Obfuscation** Derek Donaldson 9:15 AM  Subjective: Patient had recurrent of his biliary colic probably due to CBD stones but no other medical issues since I seen him in the office and he had an uneventful laparoscopic cholecystectomy except for a positive Intra-Op cholangiogram  Objective: Vital signs stable afebrile no acute distress physical exam please see pre-assessment evaluation labs reviewed  Assessment: CBD stones  Plan: Okay for anesthesia an ERCP this morning and the risks benefits methods were discussed with both the patient and his wife  [facility]

## 2012-03-21 NOTE — Preoperative (Signed)
**Note De-identified Rochel Obfuscation** Beta Blockers   Reason not to administer Beta Blockers:Not Applicable 

## 2012-03-21 NOTE — Op Note (Signed)
**Note De-Identified Bunten Obfuscation** [facility] [address] [place], [address]   ERCP PROCEDURE REPORT  PATIENT: Derek Donaldson, Derek D.  MR# :[id number] BIRTHDATE: [birth year]  GENDER: Male ENDOSCOPIST: Vida Rigger, MD REFERRED BY: Karie Soda, M.D. PROCEDURE DATE:  03/21/2012 PROCEDURE:   ERCP with sphincterotomy/papillotomy and ERCP with removal of calculus/calculi ASA CLASS:    1 INDICATIONS: CBD stones on IOC MEDICATIONS:    general anesthesia TOPICAL ANESTHETIC:  none  DESCRIPTION OF PROCEDURE:   After the risks benefits and alternatives of the procedure were thoroughly explained, informed consent was obtained.  The duodenoscope [id number]  endoscope was introduced through the mouth and advanced to the second portion of the duodenum .the stomach did have a lot of debris some of which was suctioned and a normal appearing ampulla was brought into view although at the end of the procedure he seemed to have a bulbous minor ampulla. Using the triple lumen sphincterotome loaded with the JAG Jagwire deep selective cannulation was made on the first attempt and the CBD was filled after the wire was advanced into the intrahepatic and there was minimal intrahepatic filling. We thought we saw a small stone in the distal duct and proceeded with a sphincterotomy in the customary fashion until we had adequate biliary drainage and could pull the fully bowed sphincterotome easily in and out of the duct . We then exchanged the adjustable 12-15 mm balloon and proceeded with multiple balloon pull-through on both 12 and 15 and on the initial 1 some sludge and debris was removed and the balloon passed readily through the patent sphincterotomy site however on 3 subsequent pull throughs there was no further stone debris or sludge removed. We then proceeded with an occlusion cholangiogram in the customary fashion which was normal without residual stone and the balloon was pulled through the patent sphincterotomy site and  although drainage was sluggish at this time probably due to spasm and edema we elected to stop the procedure and the scope was withdrawn back into the stomach some of the liquid and material was suctioned scope removed patient tolerated the procedure well there was no obvious immediate complication       COMPLICATIONS:   none  ENDOSCOPIC IMPRESSION:1.normal ampulla status post sphincterotomy with some sludge and debris removed but no obvious significant stone 2. Food in the stomach 3. Questionable edematous minor ampulla 4. Otherwise within normal limits and no PD cannulation or wire advancements not mentioned above  RECOMMENDATIONS:watch for delayed complication if none slowly advance diet later today and hopefully home either later today or tomorrow and will see back in a few weeks in the office and call sooner when necessary     _______________________________ eSigned:  Vida Rigger, MD 03/21/2012 10:21 AM   XB:[id number] Michaell Cowing, MD

## 2012-03-21 NOTE — Progress Notes (Signed)
**Note De-Identified Ogden Obfuscation** Nutrition Brief Note  Patient identified on the Malnutrition Screening Tool (MST) Report  Body mass index is 35.70 kg/(m^2). Pt meets criteria for obese based on current BMI.   Current diet order is clear liquid, patient is consuming approximately sips of meals at this time. Labs and medications reviewed.  Pt reports that after he started having "issues" in March, he made significant changes to his diet.  He stopped eating fast food and started eating meals prepared at home.  Pt states he weighed 276 lbs in March, now weighs 256 lbs representing a 7% wt change in ~9 months.  Pt states he has no nutrition-related concerns and has seen improvement in mobility and shortness of breath since losing wt.  No nutrition interventions warranted at this time. If nutrition issues arise, please consult RD.   Loyce Dys, MS RD LDN Clinical Inpatient Dietitian Pager: [phone] Weekend/After hours pager: [phone]

## 2012-03-21 NOTE — Progress Notes (Signed)
**Note De-Identified Piche Obfuscation** Called [facility] in reference to patient needed to be transported to [facility] by 0730 was informed by Duwayne Heck; "[facility] was already aware to transport patient to [facility]

## 2012-03-21 NOTE — ED Provider Notes (Signed)
**Note De-Identified Hugill Obfuscation** Medical screening examination/treatment/procedure(s) were conducted as a shared visit with non-physician practitioner(s) and myself.  I personally evaluated the patient during the encounter  Flint Melter, MD 03/21/12 [phone]

## 2012-03-21 NOTE — Anesthesia Preprocedure Evaluation (Addendum)
**Note De-Identified Sylvestre Obfuscation** Anesthesia Evaluation  Patient identified by MRN, date of birth, ID band Patient awake    Reviewed: Allergy & Precautions, H&P , NPO status , Patient's Chart, lab work & pertinent test results, reviewed documented beta blocker date and time   Airway Mallampati: II TM Distance: >3 FB Neck ROM: full    Dental No notable dental hx. (+) Teeth Intact and Dental Advisory Given   Pulmonary shortness of breath and with exertion,  Not really asthma, just shortness of breath. breath sounds clear to auscultation  Pulmonary exam normal       Cardiovascular Exercise Tolerance: Good hypertension, Pt. on home beta blockers Rhythm:regular Rate:Normal     Neuro/Psych negative neurological ROS  negative psych ROS   GI/Hepatic negative GI ROS, Neg liver ROS, GERD-  Medicated and Controlled,  Endo/Other  negative endocrine ROS  Renal/GU negative Renal ROS  negative genitourinary   Musculoskeletal   Abdominal   Peds  Hematology negative hematology ROS (+)   Anesthesia Other Findings   Reproductive/Obstetrics negative OB ROS                          Anesthesia Physical Anesthesia Plan  ASA: II  Anesthesia Plan: General   Post-op Pain Management:    Induction: Intravenous  Airway Management Planned: Oral ETT  Additional Equipment:   Intra-op Plan:   Post-operative Plan: Extubation in OR  Informed Consent: I have reviewed the patients History and Physical, chart, labs and discussed the procedure including the risks, benefits and alternatives for the proposed anesthesia with the patient or authorized representative who has indicated his/her understanding and acceptance.   Dental Advisory Given  Plan Discussed with: CRNA and Surgeon  Anesthesia Plan Comments:         Anesthesia Quick Evaluation

## 2012-03-21 NOTE — Progress Notes (Signed)
**Note De-Identified [place] Obfuscation** Derek Donaldson [id number] 27-Jun-1970   Subjective:  Tired after ERCP Pain much less - feels better Trying sips Wife in room  Objective:  Vital signs:  Filed Vitals:   03/21/12 1045 03/21/12 1050 03/21/12 1100 03/21/12 1219  BP: 125/72 125/72 123/67 136/73  Pulse:    68  Temp: 97.5 F (36.4 C)   97.9 F (36.6 C)  TempSrc:    Oral  Resp: 14 16 15 16   Height:      Weight:      SpO2: 97% 97% 97% 94%    Last BM Date: 03/20/12  Intake/Output   Yesterday:    This shift:  Total I/O In: 2902.1 [I.V.:2902.1] Out: -   Bowel function:  Flatus: n  BM: n  Physical Exam:  General: Pt awake/alert/oriented x4 in no acute distress Eyes: PERRL, normal EOM.  Sclera clear.  No icterus Neuro: CN II-XII intact w/o focal sensory/motor deficits. Lymph: No head/neck/groin lymphadenopathy Psych:  No delerium/psychosis/paranoia HENT: Normocephalic, Mucus membranes moist.  No thrush Neck: Supple, No tracheal deviation Chest: No chest wall pain w good excursion CV:  Pulses intact.  Regular rhythm MS: Normal AROM mjr joints.  No obvious deformity Abdomen: Soft.  Nondistended.  Mildly tender at supraumb incision only.  No incarcerated hernias.  No peritonitis Ext:  SCDs BLE.  No mjr edema.  No cyanosis Skin: No petechiae / purpurae  Problem List:  Principal Problem:  *Choledocholithiasis Active Problems:  Essential hypertension, benign  Precordial pain  GERD (gastroesophageal reflux disease)   Assessment  Derek Donaldson  41 y.o. male  Day of Surgery  Procedure(s): ENDOSCOPIC RETROGRADE CHOLANGIOPANCREATOGRAPHY (ERCP)  Stable  Plan:  -try PO -GERD control -HTN control -VTE prophylaxis- SCDs, etc -mobilize as tolerated to help recovery  D/C patient from hospital when patient meets criteria (tomorrow vs tonight):  Tolerating oral intake well Ambulating in walkways Adequate pain control without IV medications Urinating  Having flatus   Ardeth Sportsman, M.D.,  F.A.C.S. Gastrointestinal and Minimally Invasive Surgery [facility] [address], [address] [place], [place] [address] [phone] Main / Paging [phone] Voice Mail   03/21/2012  CARE TEAM:  PCP: Josue Hector, MD  Outpatient Care Team: Patient Care Team: Joette Catching, MD as PCP - General (Family Medicine) Petra Kuba, MD as Consulting Physician (Gastroenterology) Jonelle Sidle, MD as Consulting Physician (Cardiology) Ardeth Sportsman, MD as Consulting Physician (General Surgery)  Inpatient Treatment Team: Treatment Team: Attending Provider: Ardeth Sportsman, MD; Registered Nurse: Webb Silversmith, RN; Consulting Physician: Petra Kuba, MD; Consulting Physician: Bishop Limbo, MD; Respiratory Therapist: Cloyd Stagers, RT   Results:   Labs: Results for orders placed during the hospital encounter of 03/20/12 (from the past 48 hour(s))  CBC WITH DIFFERENTIAL     Status: Abnormal   Collection Time   03/20/12  7:13 PM      Component Value Range Comment   WBC 13.3 (*) 4.0 - 10.5 K/uL    RBC 5.04  4.22 - 5.81 MIL/uL    Hemoglobin 15.4  13.0 - 17.0 g/dL    HCT 84.1  32.4 - 40.1 %    MCV 88.3  78.0 - 100.0 fL    MCH 30.6  26.0 - 34.0 pg    MCHC 34.6  30.0 - 36.0 g/dL    RDW 02.7  25.3 - 66.4 %    Platelets 316  150 - 400 K/uL    Neutrophils Relative 70 **Note De-Identified [place] Obfuscation** 43 - 77 %    Neutro Abs 9.3 (*) 1.7 - 7.7 K/uL    Lymphocytes Relative 19  12 - 46 %    Lymphs Abs 2.5  0.7 - 4.0 K/uL    Monocytes Relative 9  3 - 12 %    Monocytes Absolute 1.1 (*) 0.1 - 1.0 K/uL    Eosinophils Relative 2  0 - 5 %    Eosinophils Absolute 0.2  0.0 - 0.7 K/uL    Basophils Relative 1  0 - 1 %    Basophils Absolute 0.1  0.0 - 0.1 K/uL   COMPREHENSIVE METABOLIC PANEL     Status: Abnormal   Collection Time   03/20/12  7:13 PM      Component Value Range Comment   Sodium 141  135 - 145 mEq/L    Potassium 3.7  3.5 - 5.1 mEq/L    Chloride 103  96 - 112 mEq/L    CO2  26  19 - 32 mEq/L    Glucose, Bld 120 (*) 70 - 99 mg/dL    BUN 15  6 - 23 mg/dL    Creatinine, Ser 6.44  0.50 - 1.35 mg/dL    Calcium 03.4  8.4 - 10.5 mg/dL    Total Protein 8.3  6.0 - 8.3 g/dL    Albumin 4.6  3.5 - 5.2 g/dL    AST 742 (*) 0 - 37 U/L    ALT 132 (*) 0 - 53 U/L    Alkaline Phosphatase 103  39 - 117 U/L    Total Bilirubin 1.1  0.3 - 1.2 mg/dL    GFR calc non Af Amer 74 (*) >90 mL/min    GFR calc Af Amer 86 (*) >90 mL/min   LIPASE, BLOOD     Status: Normal   Collection Time   03/20/12  7:13 PM      Component Value Range Comment   Lipase 30  11 - 59 U/L   POCT I-STAT TROPONIN I     Status: Normal   Collection Time   03/20/12  7:30 PM      Component Value Range Comment   Troponin i, poc 0.01  0.00 - 0.08 ng/mL    Comment 3            URINALYSIS, ROUTINE W REFLEX MICROSCOPIC     Status: Abnormal   Collection Time   03/20/12  8:16 PM      Component Value Range Comment   Color, Urine YELLOW  YELLOW    APPearance TURBID (*) CLEAR    Specific Gravity, Urine 1.027  1.005 - 1.030    pH 7.0  5.0 - 8.0    Glucose, UA NEGATIVE  NEGATIVE mg/dL    Hgb urine dipstick NEGATIVE  NEGATIVE    Bilirubin Urine SMALL (*) NEGATIVE    Ketones, ur NEGATIVE  NEGATIVE mg/dL    Protein, ur NEGATIVE  NEGATIVE mg/dL    Urobilinogen, UA 4.0 (*) 0.0 - 1.0 mg/dL    Nitrite NEGATIVE  NEGATIVE    Leukocytes, UA NEGATIVE  NEGATIVE   URINE MICROSCOPIC-ADD ON     Status: Normal   Collection Time   03/20/12  8:16 PM      Component Value Range Comment   Squamous Epithelial / LPF RARE  RARE    WBC, UA 0-2  <3 WBC/hpf    RBC / HPF 0-2  <3 RBC/hpf    Bacteria, UA RARE  RARE **Note De-Identified [place] Obfuscation** Urine-Other AMORPHOUS URATES/PHOSPHATES     COMPREHENSIVE METABOLIC PANEL     Status: Abnormal   Collection Time   03/21/12  6:40 AM      Component Value Range Comment   Sodium 138  135 - 145 mEq/L    Potassium 3.5  3.5 - 5.1 mEq/L    Chloride 102  96 - 112 mEq/L    CO2 24  19 - 32 mEq/L    Glucose, Bld 124 (*) 70 - 99  mg/dL    BUN 13  6 - 23 mg/dL    Creatinine, Ser 4.69  0.50 - 1.35 mg/dL    Calcium 9.4  8.4 - 62.9 mg/dL    Total Protein 7.0  6.0 - 8.3 g/dL    Albumin 3.8  3.5 - 5.2 g/dL    AST 528 (*) 0 - 37 U/L    ALT 215 (*) 0 - 53 U/L    Alkaline Phosphatase 90  39 - 117 U/L    Total Bilirubin 2.0 (*) 0.3 - 1.2 mg/dL    GFR calc non Af Amer 79 (*) >90 mL/min    GFR calc Af Amer >90  >90 mL/min   CBC     Status: Normal   Collection Time   03/21/12  6:40 AM      Component Value Range Comment   WBC 10.1  4.0 - 10.5 K/uL    RBC 4.56  4.22 - 5.81 MIL/uL    Hemoglobin 13.7  13.0 - 17.0 g/dL    HCT 41.3  24.4 - 01.0 %    MCV 87.5  78.0 - 100.0 fL    MCH 30.0  26.0 - 34.0 pg    MCHC 34.3  30.0 - 36.0 g/dL    RDW 27.2  53.6 - 64.4 %    Platelets 287  150 - 400 K/uL   ABO/RH     Status: Normal   Collection Time   03/21/12  8:25 AM      Component Value Range Comment   ABO/RH(D) O POS     TYPE AND SCREEN     Status: Normal   Collection Time   03/21/12  9:00 AM      Component Value Range Comment   ABO/RH(D) O POS      Antibody Screen NEG      Sample Expiration 03/24/2012       Imaging / Studies: Dg Abd Acute W/chest  03/20/2012  *RADIOLOGY REPORT*  Clinical Data: Chest pain/epigastric abdominal pain that extends to left side of abdomen, HX: GERD, gall bladder removed 03-17-12  ACUTE ABDOMEN SERIES (ABDOMEN 2 VIEW & CHEST 1 VIEW)  Comparison: Multiple exams, including 02/17/2012  Findings: Primarily band-like opacities are present along both hemidiaphragms.  Low lung volumes noted.  Mild cardiomegaly may be partially attributable to these low lung volumes.  No free peroneal gas is observed beneath the hemidiaphragms.  Right upper quadrant clips likely reflect prior cholecystectomy.  Gas and stool noted in nondilated colon.  Paucity of bowel gas.  No compelling findings of ascites.  IMPRESSION:  1.  Low lung volumes with band-like opacities at the lung bases favoring atelectasis or pneumonia. 2.  Mild  cardiomegaly. 3.  No specific abnormality of bowel gas pattern is observed, although there is a paucity of gas in the small bowel.   Original Report Authenticated By: Gaylyn Rong, M.D.     Medications / Allergies: per chart  Antibiotics: Anti-infectives     Start **Note De-Identified [place] Obfuscation** Dose/Rate Route Frequency Ordered Stop   03/21/12 1000   cefoTEtan (CEFOTAN) 1 g in dextrose 5 % 50 mL IVPB  Status:  Discontinued        1 g 100 mL/hr over 30 Minutes Intravenous Every 12 hours 03/21/12 0854 03/21/12 1217

## 2012-03-21 NOTE — Progress Notes (Signed)
**Note De-Identified [place] Obfuscation** Pt was educated on procedure and informed he will be transfer to [facility]. Obtain consent.

## 2012-03-21 NOTE — Progress Notes (Signed)
**Note De-Identified [place] Obfuscation** If no delayed complications tolerating clear liquids this afternoon if okay with surgical team could go home after soft solids dinner tonight if doing well and he has surgical followup in 2 weeks and I would be happy to see back sooner when necessary

## 2012-03-21 NOTE — Transfer of Care (Signed)
**Note De-Identified [place] Obfuscation** Immediate Anesthesia Transfer of Care Note  Patient: Derek Donaldson  Procedure(s) Performed: Procedure(s) (LRB) with comments: ENDOSCOPIC RETROGRADE CHOLANGIOPANCREATOGRAPHY (ERCP) (N/A) - aqntibiotics and type and screen  Patient Location: PACU and Endoscopy Unit  Anesthesia Type:General  Level of Consciousness: awake, alert , oriented and patient cooperative  Airway & Oxygen Therapy: Patient Spontanous Breathing and Patient connected to face mask oxygen  Post-op Assessment: Report given to PACU RN, Post -op Vital signs reviewed and stable and Patient moving all extremities  Post vital signs: Reviewed and stable  Complications: No apparent anesthesia complications

## 2012-03-22 ENCOUNTER — Encounter (HOSPITAL_COMMUNITY): Payer: Self-pay | Admitting: Gastroenterology

## 2012-03-22 ENCOUNTER — Encounter (INDEPENDENT_AMBULATORY_CARE_PROVIDER_SITE_OTHER): Payer: Self-pay | Admitting: Surgery

## 2012-03-22 LAB — CBC WITH DIFFERENTIAL/PLATELET
Eosinophils Relative: 1 % (ref 0–5)
Lymphocytes Relative: 25 % (ref 12–46)
Lymphs Abs: 2.4 10*3/uL (ref 0.7–4.0)
MCV: 89.1 fL (ref 78.0–100.0)
Neutrophils Relative %: 67 % (ref 43–77)
Platelets: 284 10*3/uL (ref 150–400)
RBC: 4.33 MIL/uL (ref 4.22–5.81)
WBC: 9.5 10*3/uL (ref 4.0–10.5)

## 2012-03-22 LAB — COMPREHENSIVE METABOLIC PANEL
ALT: 240 U/L — ABNORMAL HIGH (ref 0–53)
Alkaline Phosphatase: 88 U/L (ref 39–117)
CO2: 25 mEq/L (ref 19–32)
GFR calc Af Amer: >90 mL/min (ref >90)
GFR calc non Af Amer: >90 mL/min (ref >90)
Glucose, Bld: 88 mg/dL (ref 70–99)
Potassium: 3.4 mEq/L — ABNORMAL LOW (ref 3.5–5.1)
Sodium: 138 mEq/L (ref 135–145)
Total Bilirubin: 0.9 mg/dL (ref 0.3–1.2)

## 2012-03-22 NOTE — Discharge Summary (Signed)
**Note De-Identified [place] Obfuscation** Physician Discharge Summary  Patient ID: Derek Donaldson MRN: [id number] DOB/AGE: 17-Apr-1971 41 y.o.  Admit date: 03/20/2012 Discharge date: 03/22/2012  Admission Diagnoses: Principal Problem:  *Choledocholithiasis Active Problems:  Essential hypertension, benign  Precordial pain  GERD (gastroesophageal reflux disease)  Discharge Diagnoses:  Principal Problem:  *Choledocholithiasis Active Problems:  Essential hypertension, benign  Mixed hyperlipidemia  GERD (gastroesophageal reflux disease)   Discharged Condition: good  Hospital Course:   Pt with incidental CBD stones noted on lap chole/IOC last week.  Scheduled for ERCP Tuesday.  Had epigastric pain after [place] food - admitted from ED.  Hydrated.  Underwent ERCP.  Post procedure mobilized and advanced to a solid diet gradually.  Pain was well-controlled and transitioned off IV medications.    By the time of discharge, the patient was walking well the hallways, eating food well, having flatus.  Pain was-controlled on an oral regimen.  Based on meeting DC criteria and recovering well, I felt it was safe for the patient to be discharged home with close followup.  Instructions were discussed in detail w pt & wife.  They are written as well.  They expressed understanding & appreciation   \  Consults: GI  Significant Diagnostic Studies: labs:   Results for orders placed during the hospital encounter of 03/20/12 (from the past 72 hour(s))  CBC WITH DIFFERENTIAL     Status: Abnormal   Collection Time   03/20/12  7:13 PM      Component Value Range Comment   WBC 13.3 (*) 4.0 - 10.5 K/uL    RBC 5.04  4.22 - 5.81 MIL/uL    Hemoglobin 15.4  13.0 - 17.0 g/dL    HCT 56.2  13.0 - 86.5 %    MCV 88.3  78.0 - 100.0 fL    MCH 30.6  26.0 - 34.0 pg    MCHC 34.6  30.0 - 36.0 g/dL    RDW 78.4  69.6 - 29.5 %    Platelets 316  150 - 400 K/uL    Neutrophils Relative 70  43 - 77 %    Neutro Abs 9.3 (*) 1.7 - 7.7 K/uL    Lymphocytes Relative 19   12 - 46 %    Lymphs Abs 2.5  0.7 - 4.0 K/uL    Monocytes Relative 9  3 - 12 %    Monocytes Absolute 1.1 (*) 0.1 - 1.0 K/uL    Eosinophils Relative 2  0 - 5 %    Eosinophils Absolute 0.2  0.0 - 0.7 K/uL    Basophils Relative 1  0 - 1 %    Basophils Absolute 0.1  0.0 - 0.1 K/uL   COMPREHENSIVE METABOLIC PANEL     Status: Abnormal   Collection Time   03/20/12  7:13 PM      Component Value Range Comment   Sodium 141  135 - 145 mEq/L    Potassium 3.7  3.5 - 5.1 mEq/L    Chloride 103  96 - 112 mEq/L    CO2 26  19 - 32 mEq/L    Glucose, Bld 120 (*) 70 - 99 mg/dL    BUN 15  6 - 23 mg/dL    Creatinine, Ser 2.84  0.50 - 1.35 mg/dL    Calcium 13.2  8.4 - 10.5 mg/dL    Total Protein 8.3  6.0 - 8.3 g/dL    Albumin 4.6  3.5 - 5.2 g/dL    AST 440 (*) 0 - 37 U/L **Note De-Identified [place] Obfuscation** ALT 132 (*) 0 - 53 U/L    Alkaline Phosphatase 103  39 - 117 U/L    Total Bilirubin 1.1  0.3 - 1.2 mg/dL    GFR calc non Af Amer 74 (*) >90 mL/min    GFR calc Af Amer 86 (*) >90 mL/min   LIPASE, BLOOD     Status: Normal   Collection Time   03/20/12  7:13 PM      Component Value Range Comment   Lipase 30  11 - 59 U/L   POCT I-STAT TROPONIN I     Status: Normal   Collection Time   03/20/12  7:30 PM      Component Value Range Comment   Troponin i, poc 0.01  0.00 - 0.08 ng/mL    Comment 3            URINALYSIS, ROUTINE W REFLEX MICROSCOPIC     Status: Abnormal   Collection Time   03/20/12  8:16 PM      Component Value Range Comment   Color, Urine YELLOW  YELLOW    APPearance TURBID (*) CLEAR    Specific Gravity, Urine 1.027  1.005 - 1.030    pH 7.0  5.0 - 8.0    Glucose, UA NEGATIVE  NEGATIVE mg/dL    Hgb urine dipstick NEGATIVE  NEGATIVE    Bilirubin Urine SMALL (*) NEGATIVE    Ketones, ur NEGATIVE  NEGATIVE mg/dL    Protein, ur NEGATIVE  NEGATIVE mg/dL    Urobilinogen, UA 4.0 (*) 0.0 - 1.0 mg/dL    Nitrite NEGATIVE  NEGATIVE    Leukocytes, UA NEGATIVE  NEGATIVE   URINE MICROSCOPIC-ADD ON     Status: Normal   Collection  Time   03/20/12  8:16 PM      Component Value Range Comment   Squamous Epithelial / LPF RARE  RARE    WBC, UA 0-2  <3 WBC/hpf    RBC / HPF 0-2  <3 RBC/hpf    Bacteria, UA RARE  RARE    Urine-Other AMORPHOUS URATES/PHOSPHATES     COMPREHENSIVE METABOLIC PANEL     Status: Abnormal   Collection Time   03/21/12  6:40 AM      Component Value Range Comment   Sodium 138  135 - 145 mEq/L    Potassium 3.5  3.5 - 5.1 mEq/L    Chloride 102  96 - 112 mEq/L    CO2 24  19 - 32 mEq/L    Glucose, Bld 124 (*) 70 - 99 mg/dL    BUN 13  6 - 23 mg/dL    Creatinine, Ser 7.82  0.50 - 1.35 mg/dL    Calcium 9.4  8.4 - 95.6 mg/dL    Total Protein 7.0  6.0 - 8.3 g/dL    Albumin 3.8  3.5 - 5.2 g/dL    AST 213 (*) 0 - 37 U/L    ALT 215 (*) 0 - 53 U/L    Alkaline Phosphatase 90  39 - 117 U/L    Total Bilirubin 2.0 (*) 0.3 - 1.2 mg/dL    GFR calc non Af Amer 79 (*) >90 mL/min    GFR calc Af Amer >90  >90 mL/min   CBC     Status: Normal   Collection Time   03/21/12  6:40 AM      Component Value Range Comment   WBC 10.1  4.0 - 10.5 K/uL    RBC 4.56  4.22 - 5.81 **Note De-Identified [place] Obfuscation** MIL/uL    Hemoglobin 13.7  13.0 - 17.0 g/dL    HCT 40.9  81.1 - 91.4 %    MCV 87.5  78.0 - 100.0 fL    MCH 30.0  26.0 - 34.0 pg    MCHC 34.3  30.0 - 36.0 g/dL    RDW 78.2  95.6 - 21.3 %    Platelets 287  150 - 400 K/uL   ABO/RH     Status: Normal   Collection Time   03/21/12  8:25 AM      Component Value Range Comment   ABO/RH(D) O POS     TYPE AND SCREEN     Status: Normal   Collection Time   03/21/12  9:00 AM      Component Value Range Comment   ABO/RH(D) O POS      Antibody Screen NEG      Sample Expiration 03/24/2012     CBC WITH DIFFERENTIAL     Status: Abnormal   Collection Time   03/22/12  6:30 AM      Component Value Range Comment   WBC 9.5  4.0 - 10.5 K/uL    RBC 4.33  4.22 - 5.81 MIL/uL    Hemoglobin 13.1  13.0 - 17.0 g/dL    HCT 08.6 (*) 57.8 - 52.0 %    MCV 89.1  78.0 - 100.0 fL    MCH 30.3  26.0 - 34.0 pg    MCHC 33.9   30.0 - 36.0 g/dL    RDW 46.9  62.9 - 52.8 %    Platelets 284  150 - 400 K/uL    Neutrophils Relative 67  43 - 77 %    Neutro Abs 6.3  1.7 - 7.7 K/uL    Lymphocytes Relative 25  12 - 46 %    Lymphs Abs 2.4  0.7 - 4.0 K/uL    Monocytes Relative 7  3 - 12 %    Monocytes Absolute 0.7  0.1 - 1.0 K/uL    Eosinophils Relative 1  0 - 5 %    Eosinophils Absolute 0.1  0.0 - 0.7 K/uL    Basophils Relative 0  0 - 1 %    Basophils Absolute 0.0  0.0 - 0.1 K/uL   COMPREHENSIVE METABOLIC PANEL     Status: Abnormal   Collection Time   03/22/12  6:30 AM      Component Value Range Comment   Sodium 138  135 - 145 mEq/L    Potassium 3.4 (*) 3.5 - 5.1 mEq/L    Chloride 102  96 - 112 mEq/L    CO2 25  19 - 32 mEq/L    Glucose, Bld 88  70 - 99 mg/dL    BUN 10  6 - 23 mg/dL    Creatinine, Ser 4.13  0.50 - 1.35 mg/dL    Calcium 9.7  8.4 - 24.4 mg/dL    Total Protein 6.8  6.0 - 8.3 g/dL    Albumin 3.7  3.5 - 5.2 g/dL    AST 010 (*) 0 - 37 U/L    ALT 240 (*) 0 - 53 U/L    Alkaline Phosphatase 88  39 - 117 U/L    Total Bilirubin 0.9  0.3 - 1.2 mg/dL    GFR calc non Af Amer >90  >90 mL/min    GFR calc Af Amer >90  >90 mL/min     Treatments: ERCP  Discharge Exam: Blood pressure 145/72, pulse 59, **Note De-Identified [place] Obfuscation** temperature 97.6 F (36.4 C), temperature source Oral, resp. rate 16, height 5\' 11"  (1.803 m), weight 256 lb (116.121 kg), SpO2 97.00%.  General: Pt awake/alert/oriented x4 in no major acute distress.  Wife in room Eyes: PERRL, normal EOM. Sclera nonicteric Neuro: CN II-XII intact w/o focal sensory/motor deficits. Lymph: No head/neck/groin lymphadenopathy Psych:  No delerium/psychosis/paranoia HENT: Normocephalic, Mucus membranes moist.  No thrush Neck: Supple, No tracheal deviation Chest: No pain.  Good respiratory excursion. CV:  Pulses intact.  Regular rhythm MS: Normal AROM mjr joints.  No obvious deformity Abdomen: Soft, Nondistended.  Nontender at suprapubic incision.  No incarcerated hernias. Ext:   SCDs BLE.  No significant edema.  No cyanosis Skin: No petechiae / purpurae   Disposition: 01-Home or Self Care  Discharge Orders    Future Appointments: Provider: Department: Dept Phone: Center:   04/03/2012 8:30 AM Ardeth Sportsman, MD [facility], [place] [phone] None     Future Orders Please Complete By Expires   Diet - low sodium heart healthy      Increase activity slowly          Medication List     As of 03/22/2012 10:14 AM    TAKE these medications         acetaminophen 500 MG tablet   Commonly known as: TYLENOL   Take 1,000 mg by mouth every 6 (six) hours as needed. For pain      Amlodipine-Valsartan-HCTZ 10-320-25 MG Tabs   Take 1 tablet by mouth daily.      ANTARA 130 MG capsule   Generic drug: fenofibrate micronized   Take 130 mg by mouth daily before breakfast.      aspirin EC 81 MG tablet   Take 81 mg by mouth daily.      budesonide-formoterol 160-4.5 MCG/ACT inhaler   Commonly known as: SYMBICORT   Inhale 2 puffs into the lungs 2 (two) times daily.      COLON CLEANSER PO   Take 1 capsule by mouth daily as needed. For constipation      DEXILANT 60 MG capsule   Generic drug: dexlansoprazole   Take 60 mg by mouth daily.      nebivolol 5 MG tablet   Commonly known as: BYSTOLIC   Take 5 mg by mouth daily.      PRILOSEC OTC PO   Take 1 tablet by mouth daily.           Follow-up Information    Follow up with Ludene Stokke C., MD. Schedule an appointment as soon as possible for a visit in 2 weeks.   Contact information:   [address] [place] [address] [phone]          Signed: Ardeth Sportsman. 03/22/2012, 10:14 AM

## 2012-03-22 NOTE — Progress Notes (Signed)
**Note De-Identified [place]ie[place]ra Obfuscation** Derek Donaldson 9:28 AM  Subjective: Patient did not have any problems from his ERCP yesterday and is doing well and feels much better and is eating fine and passing gas from below and walking fine and has no new complaint  Objective: Vital signs stable afebrile no acute distress abdomen is soft nontender good bowel sounds labs improved Assessment: Status post ERCP and sphincterotomy for presumed CBD stents  Plan: Okay with me to go home today and follow up with me when necessary and I have discussed when to call with he and his wife and I have set up followup liver tests in my office on the day of surgical followup  Derek Donaldson

## 2012-04-03 ENCOUNTER — Ambulatory Visit (INDEPENDENT_AMBULATORY_CARE_PROVIDER_SITE_OTHER): Payer: BC Managed Care – PPO | Admitting: Surgery

## 2012-04-03 ENCOUNTER — Encounter (INDEPENDENT_AMBULATORY_CARE_PROVIDER_SITE_OTHER): Payer: Self-pay | Admitting: Surgery

## 2012-04-03 VITALS — BP 142/98 | HR 64 | Temp 97.8°F | Resp 20 | Ht 71.0 in | Wt 258.0 lb

## 2012-04-03 DIAGNOSIS — K8010 Calculus of gallbladder with chronic cholecystitis without obstruction: Secondary | ICD-10-CM | POA: Insufficient documentation

## 2012-04-03 DIAGNOSIS — K8050 Calculus of bile duct without cholangitis or cholecystitis without obstruction: Secondary | ICD-10-CM

## 2012-04-03 NOTE — Progress Notes (Signed)
**Note De-Identified Wiseman Obfuscation** Subjective:     Patient ID: Derek Donaldson, male   DOB: 13-Oct-1970, 41 y.o.   MRN: [id number]  HPI  Derek Donaldson  [birth year] [id number]  Patient Care Team: Joette Catching, MD as PCP - General (Family Medicine) Petra Kuba, MD as Consulting Physician (Gastroenterology) Jonelle Sidle, MD as Consulting Physician (Cardiology) Ardeth Sportsman, MD as Consulting Physician (General Surgery)  This patient is a 41 y.o.male who presents today for surgical evaluation.   Procedures:  Laparoscopic cholecystectomy with intraoperative cholangiogram 03/17/2012 ERCP 03/21/2012  Pathology: Chronic cholecystitis and cholecystolithiasis  The patient comes in today feeling well.  I found nonobstructing common bile duct stones on his elective cholecystectomy.  He was set up for an elective ERCP a few days later.  He developed pain in the night before he was due for his ERCP.  He was admitted and underwent ERCP and went home.  Much better since surgery & ERCP.  No nausea or vomiting.  No fevers or chills.  Energy level good.  Eating regular diet.  No diarrhea.  No constipation.  Patient Active Problem List  Diagnosis  . Essential hypertension, benign  . Mixed hyperlipidemia  . GERD (gastroesophageal reflux disease)  . Choledocholithiasis s/p ERCP [id number]  . Chronic cholecystitis with calculus s/p lap chole [id number]    Past Medical History  Diagnosis Date  . Essential hypertension, benign   . Mixed hyperlipidemia   . Sensorineural hearing loss of low frequency   . Asthma   . GERD (gastroesophageal reflux disease)   . Choledocholithiasis s/p ERCP [id number] 03/21/2012  . Chronic cholecystitis with calculus s/p lap chole [id number] 04/03/2012    Past Surgical History  Procedure Date  . 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 Vasectomy   . Cholecystectomy   . Ercp 03/21/2012    Procedure: ENDOSCOPIC RETROGRADE CHOLANGIOPANCREATOGRAPHY (ERCP);  Surgeon: Petra Kuba, MD;  Location: [facility];  Service: Endoscopy;  Laterality: N/A;  aqntibiotics and type and screen    History   Social History  . Marital Status: Married    Spouse Name: N/A    Number of Children: N/A  . Years of Education: N/A   Occupational History  . Not on file.   Social History Main Topics  . Smoking status: Never Smoker   . Smokeless tobacco: Never Used  . Alcohol Use: No  . Drug Use: No  . Sexually Active: Not on file   Other Topics Concern  . Not on file   Social History Narrative  . No narrative on file    Family History  Problem Relation Age of Onset  . Stroke Paternal Grandfather   . Cancer Maternal Grandfather     unknown type  . Asthma Mother   . Asthma Brother   . Heart failure Maternal Grandmother   . Aneurysm Father     Current Outpatient Prescriptions  Medication Sig Dispense Refill  . Amlodipine-Valsartan-HCTZ 10-320-25 MG TABS Take 1 tablet by mouth daily.      Marland Kitchen aspirin EC 81 MG tablet Take 81 mg by mouth daily.      . budesonide-formoterol (SYMBICORT) 160-4.5 MCG/ACT inhaler Inhale 2 puffs into the lungs 2 (two) times daily.      . fenofibrate micronized (ANTARA) 130 MG capsule Take 130 mg by mouth daily before breakfast.      . Misc Natural Products (COLON CLEANSER PO) Take 1 capsule by mouth daily as needed. For constipation      . **Note De-Identified Leske Obfuscation** nebivolol (BYSTOLIC) 5 MG tablet Take 5 mg by mouth daily.         No Known Allergies  BP 142/98  Pulse 64  Temp 97.8 F (36.6 C) (Temporal)  Resp 20  Ht 5\' 11"  (1.803 m)  Wt 258 lb (117.028 kg)  BMI 35.98 kg/m2  Dg Ercp With Sphincterotomy  03/21/2012  *RADIOLOGY REPORT*  Clinical Data: ERCP with sphincterotomy (CBD stone)  ERCP  Comparison:  Abdominal radiographic series - 03/20/2012; abdominal ultrasound - 02/17/2012  Findings:  Five spot radiographic images of the right upper abdominal quadrant during ERCP are provided for review.  Images demonstrate an ERCP probe overlying the right upper abdomen with selective  cannulation of the common bile duct.  Subsequent images demonstrate contrast injection of the common bile duct which appears mildly dilated.  No discrete intraluminal filling defects are seen within the common bile duct.  Surgical clips overlie the gallbladder fossa.  There is minimal opacification of the residual stump of the cystic duct as well as the central aspect of a nondilated intrahepatic biliary system.  Subsequent images demonstrate insufflation of a balloon within the CBD which is utilized in a downward sweeping motions through the common bile duct.  IMPRESSION: ERCP with sphincterotomy as above.  No definite intraluminal filling defect to suggest the presence of choledocholithiasis.   Original Report Authenticated By: Tacey Ruiz, MD    Dg Abd Acute W/chest  03/20/2012  *RADIOLOGY REPORT*  Clinical Data: Chest pain/epigastric abdominal pain that extends to left side of abdomen, HX: GERD, gall bladder removed 03-17-12  ACUTE ABDOMEN SERIES (ABDOMEN 2 VIEW & CHEST 1 VIEW)  Comparison: Multiple exams, including 02/17/2012  Findings: Primarily band-like opacities are present along both hemidiaphragms.  Low lung volumes noted.  Mild cardiomegaly may be partially attributable to these low lung volumes.  No free peroneal gas is observed beneath the hemidiaphragms.  Right upper quadrant clips likely reflect prior cholecystectomy.  Gas and stool noted in nondilated colon.  Paucity of bowel gas.  No compelling findings of ascites.  IMPRESSION:  1.  Low lung volumes with band-like opacities at the lung bases favoring atelectasis or pneumonia. 2.  Mild cardiomegaly. 3.  No specific abnormality of bowel gas pattern is observed, although there is a paucity of gas in the small bowel.   Original Report Authenticated By: Gaylyn Rong, M.D.      Review of Systems  Constitutional: Negative for fever, chills and diaphoresis.  HENT: Negative for sore throat, trouble swallowing and neck pain.   Eyes: Negative  for photophobia and visual disturbance.  Respiratory: Negative for choking and shortness of breath.   Cardiovascular: Negative for chest pain and palpitations.  Gastrointestinal: Negative for nausea, vomiting, abdominal pain, diarrhea, constipation, blood in stool, abdominal distention, anal bleeding and rectal pain.  Genitourinary: Negative for dysuria, urgency, difficulty urinating and testicular pain.  Musculoskeletal: Negative for myalgias, arthralgias and gait problem.  Skin: Negative for color change and rash.  Neurological: Negative for dizziness, speech difficulty, weakness and numbness.  Hematological: Negative for adenopathy.  Psychiatric/Behavioral: Negative for hallucinations, confusion and agitation.       Objective:   Physical Exam  Constitutional: He is oriented to person, place, and time. He appears well-developed and well-nourished. No distress.  HENT:  Head: Normocephalic.  Mouth/Throat: Oropharynx is clear and moist. No oropharyngeal exudate.  Eyes: Conjunctivae normal and EOM are normal. Pupils are equal, round, and reactive to light. No scleral icterus.  Neck: Normal range of motion. **Note De-Identified [place]son Obfuscation** No tracheal deviation present.  Cardiovascular: Normal rate, normal heart sounds and intact distal pulses.   Pulmonary/Chest: Effort normal. No respiratory distress.  Abdominal: Soft. He exhibits no distension. There is no tenderness. Hernia confirmed negative in the right inguinal area and confirmed negative in the left inguinal area.       Incisions clean with normal healing ridges.  No hernias  Musculoskeletal: Normal range of motion. He exhibits no tenderness.  Neurological: He is alert and oriented to person, place, and time. No cranial nerve deficit. He exhibits normal muscle tone. Coordination normal.  Skin: Skin is warm and dry. No rash noted. He is not diaphoretic.  Psychiatric: He has a normal mood and affect. His behavior is normal.       Assessment:     Recovering well  after cholecystectomy and ERCP for choledocholithiasis and cholecystolithiasis with chronic cholecystitis.    Plan:     Increase activity as tolerated to regular activity.  Do not push through pain.  Diet as tolerated. Bowel regimen to avoid problems.  Return to clinic p.r.n.   Instructions discussed.  Followup with primary care physician for other health issues as would normally be done.  Questions answered.  The patient expressed understanding and appreciation

## 2012-04-03 NOTE — Patient Instructions (Addendum)
**Note De-identified Morozov Obfuscation** GETTING TO GOOD BOWEL HEALTH. Irregular bowel habits such as constipation and diarrhea can lead to many problems over time.  Having one soft bowel movement a day is the most important way to prevent further problems.  The anorectal canal is designed to handle stretching and feces to safely manage our ability to get rid of solid waste (feces, poop, stool) out of our body.  BUT, hard constipated stools can act like ripping concrete bricks and diarrhea can be a burning fire to this very sensitive area of our body, causing inflamed hemorrhoids, anal fissures, increasing risk is perirectal abscesses, abdominal pain/bloating, an making irritable bowel worse.     The goal: ONE SOFT BOWEL MOVEMENT A DAY!  To have soft, regular bowel movements:    Drink at least 8 tall glasses of water a day.     Take plenty of fiber.  Fiber is the undigested part of plant food that passes into the colon, acting s "natures broom" to encourage bowel motility and movement.  Fiber can absorb and hold large amounts of water. This results in a larger, bulkier stool, which is soft and easier to pass. Work gradually over several weeks up to 6 servings a day of fiber (25g a day even more if needed) in the form of: o Vegetables -- Root (potatoes, carrots, turnips), leafy green (lettuce, salad greens, celery, spinach), or cooked high residue (cabbage, broccoli, etc) o Fruit -- Fresh (unpeeled skin & pulp), Dried (prunes, apricots, cherries, etc ),  or stewed ( applesauce)  o Whole grain breads, pasta, etc (whole wheat)  o Bran cereals    Bulking Agents -- This type of water-retaining fiber generally is easily obtained each day by one of the following:  o Psyllium bran -- The psyllium plant is remarkable because its ground seeds can retain so much water. This product is available as Metamucil, Konsyl, Effersyllium, Per Diem Fiber, or the less expensive generic preparation in drug and health food stores. Although labeled a laxative, it really  is not a laxative.  o Methylcellulose -- This is another fiber derived from wood which also retains water. It is available as Citrucel. o Polyethylene Glycol - and "artificial" fiber commonly called Miralax or Glycolax.  It is helpful for people with gassy or bloated feelings with regular fiber o Flax Seed - a less gassy fiber than psyllium   No reading or other relaxing activity while on the toilet. If bowel movements take longer than 5 minutes, you are too constipated   AVOID CONSTIPATION.  High fiber and water intake usually takes care of this.  Sometimes a laxative is needed to stimulate more frequent bowel movements, but    Laxatives are not a good long-term solution as it can wear the colon out. o Osmotics (Milk of Magnesia, Fleets phosphosoda, Magnesium citrate, MiraLax, GoLytely) are safer than  o Stimulants (Senokot, Castor Oil, Dulcolax, Ex Lax)    o Do not take laxatives for more than 7days in a row.    IF SEVERELY CONSTIPATED, try a Bowel Retraining Program: o Do not use laxatives.  o Eat a diet high in roughage, such as bran cereals and leafy vegetables.  o Drink six (6) ounces of prune or apricot juice each morning.  o Eat two (2) large servings of stewed fruit each day.  o Take one (1) heaping tablespoon of a psyllium-based bulking agent twice a day. Use sugar-free sweetener when possible to avoid excessive calories.  o Eat a normal breakfast.  o  **Note De-identified Goldin Obfuscation** Set aside 15 minutes after breakfast to sit on the toilet, but do not strain to have a bowel movement.  o If you do not have a bowel movement by the third day, use an enema and repeat the above steps.    Controlling diarrhea o Switch to liquids and simpler foods for a few days to avoid stressing your intestines further. o Avoid dairy products (especially milk & ice cream) for a short time.  The intestines often can lose the ability to digest lactose when stressed. o Avoid foods that cause gassiness or bloating.  Typical foods include  beans and other legumes, cabbage, broccoli, and dairy foods.  Every person has some sensitivity to other foods, so listen to our body and avoid those foods that trigger problems for you. o Adding fiber (Citrucel, Metamucil, psyllium, Miralax) gradually can help thicken stools by absorbing excess fluid and retrain the intestines to act more normally.  Slowly increase the dose over a few weeks.  Too much fiber too soon can backfire and cause cramping & bloating. o Probiotics (such as active yogurt, Align, etc) may help repopulate the intestines and colon with normal bacteria and calm down a sensitive digestive tract.  Most studies show it to be of mild help, though, and such products can be costly. o Medicines:   Bismuth subsalicylate (ex. Kayopectate, Pepto Bismol) every 30 minutes for up to 6 doses can help control diarrhea.  Avoid if pregnant.   Loperamide (Immodium) can slow down diarrhea.  Start with two tablets (4mg total) first and then try one tablet every 6 hours.  Avoid if you are having fevers or severe pain.  If you are not better or start feeling worse, stop all medicines and call your doctor for advice o Call your doctor if you are getting worse or not better.  Sometimes further testing (cultures, endoscopy, X-ray studies, bloodwork, etc) may be needed to help diagnose and treat the cause of the diarrhea.' 

## 2012-04-10 ENCOUNTER — Encounter (INDEPENDENT_AMBULATORY_CARE_PROVIDER_SITE_OTHER): Payer: Self-pay

## 2012-07-27 ENCOUNTER — Encounter (INDEPENDENT_AMBULATORY_CARE_PROVIDER_SITE_OTHER): Payer: Self-pay

## 2013-02-07 IMAGING — CR DG ABDOMEN ACUTE W/ 1V CHEST
3 series · 3 of 3 positions shown · non-contrast
Comparison: Multiple exams, including 02/17/2012

CLINICAL DATA: Chest pain/epigastric abdominal pain that extends to
left side of abdomen, HX: GERD, gall bladder removed 03-17-12

ACUTE ABDOMEN SERIES (ABDOMEN 2 VIEW & CHEST 1 VIEW)

[w chest pa *]
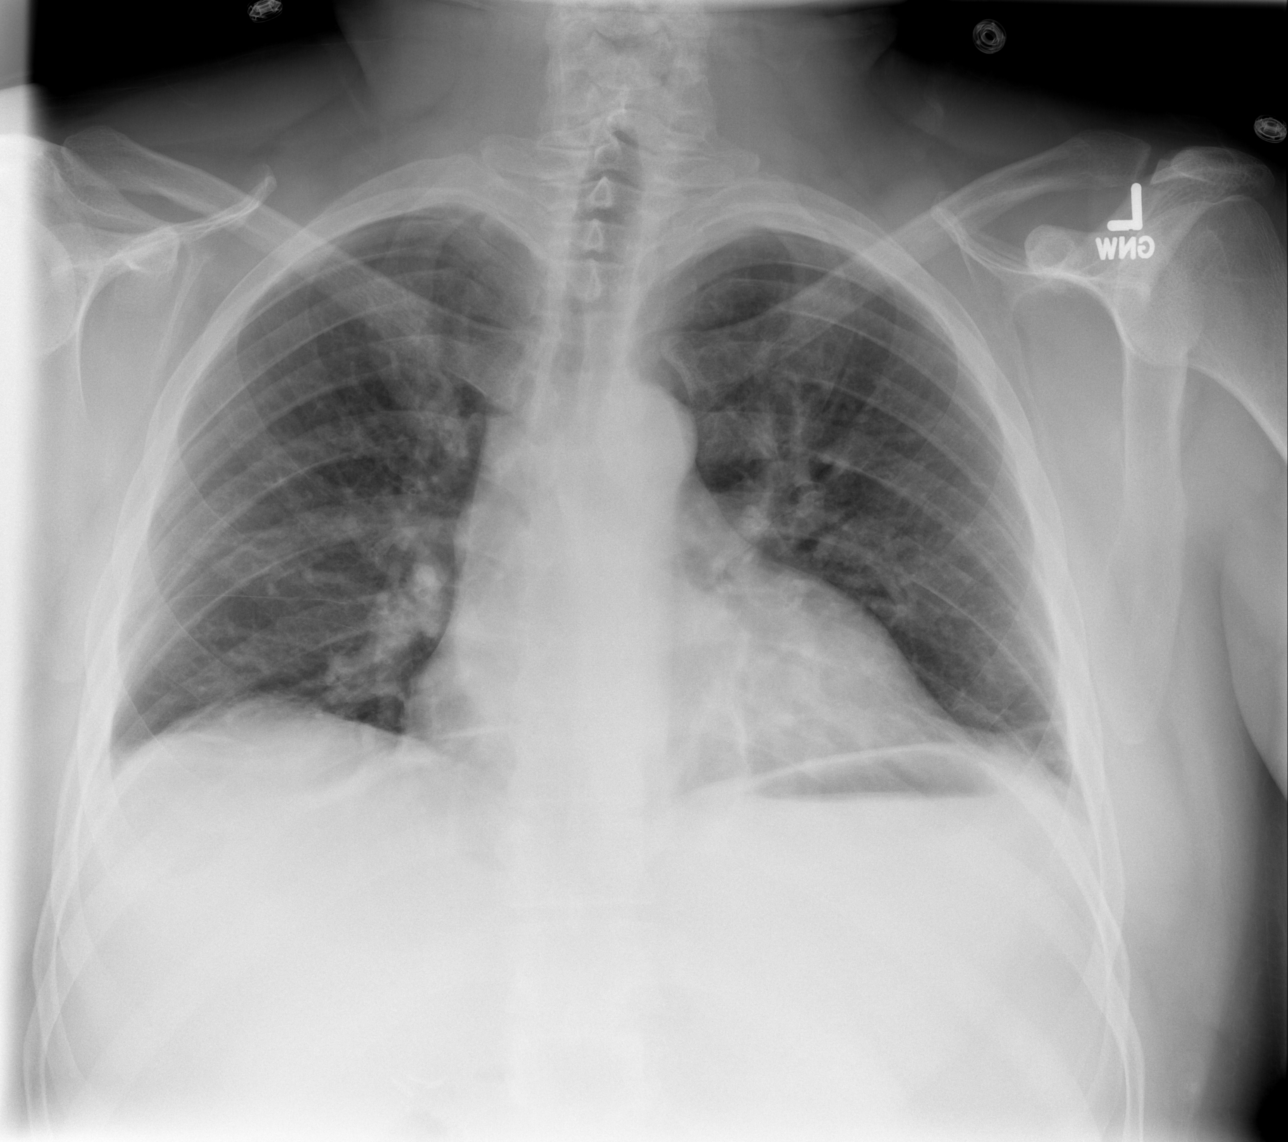

[w abdomen upright *]
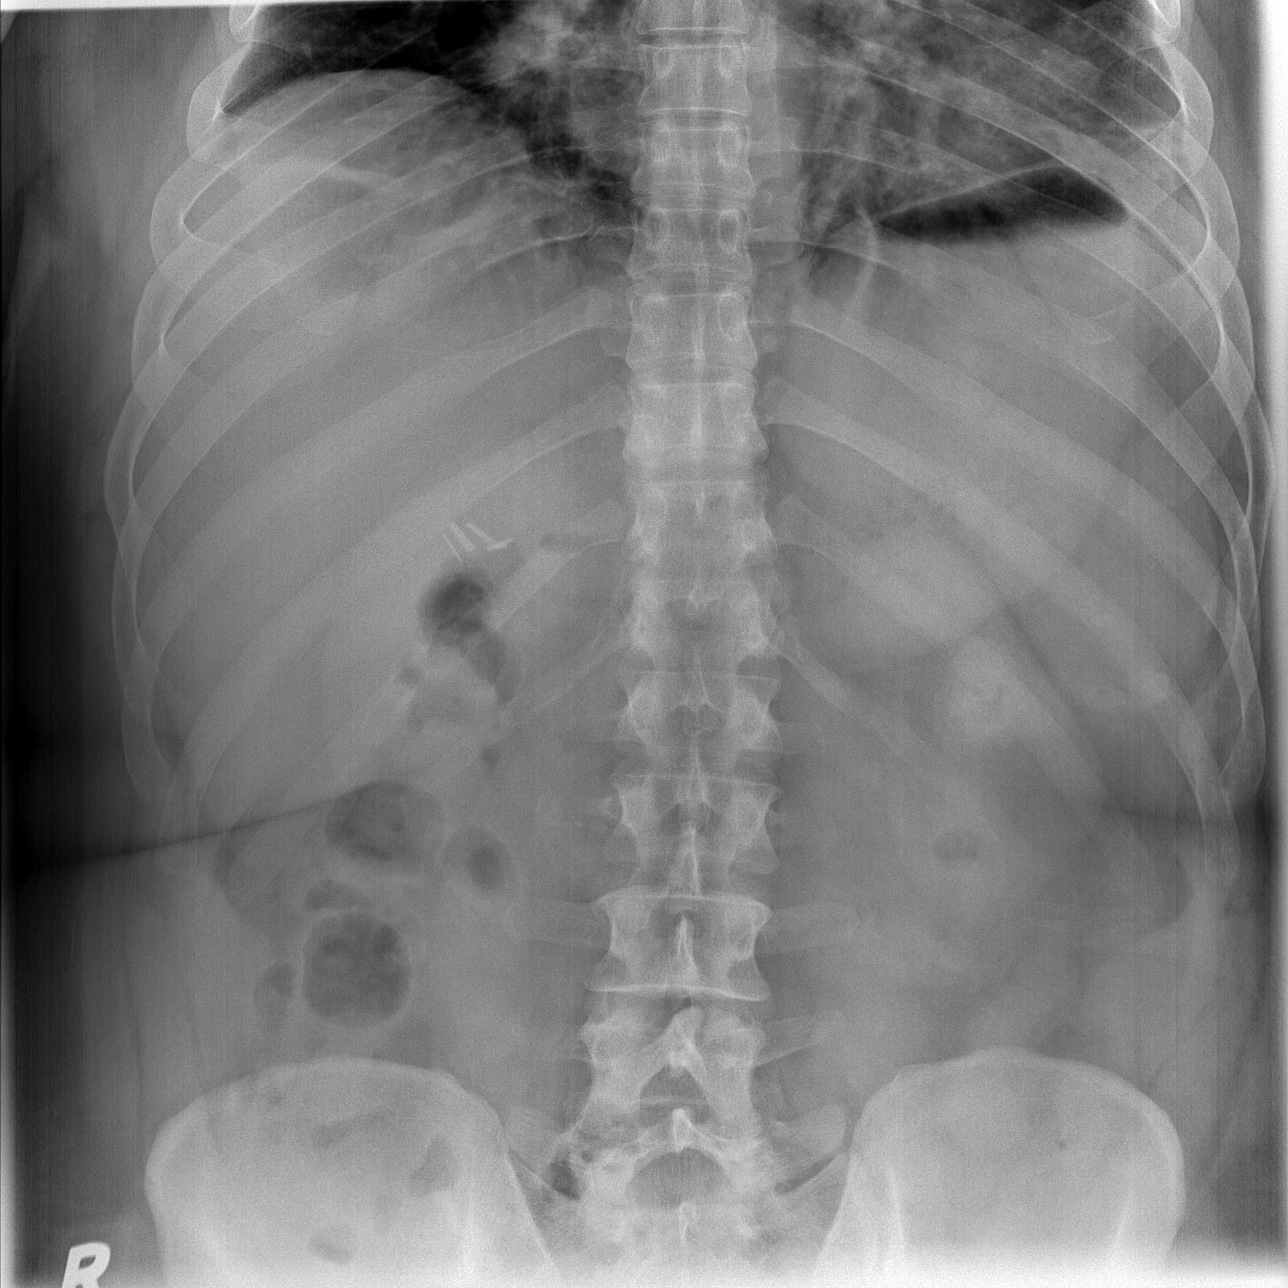

[t abdomen supine]
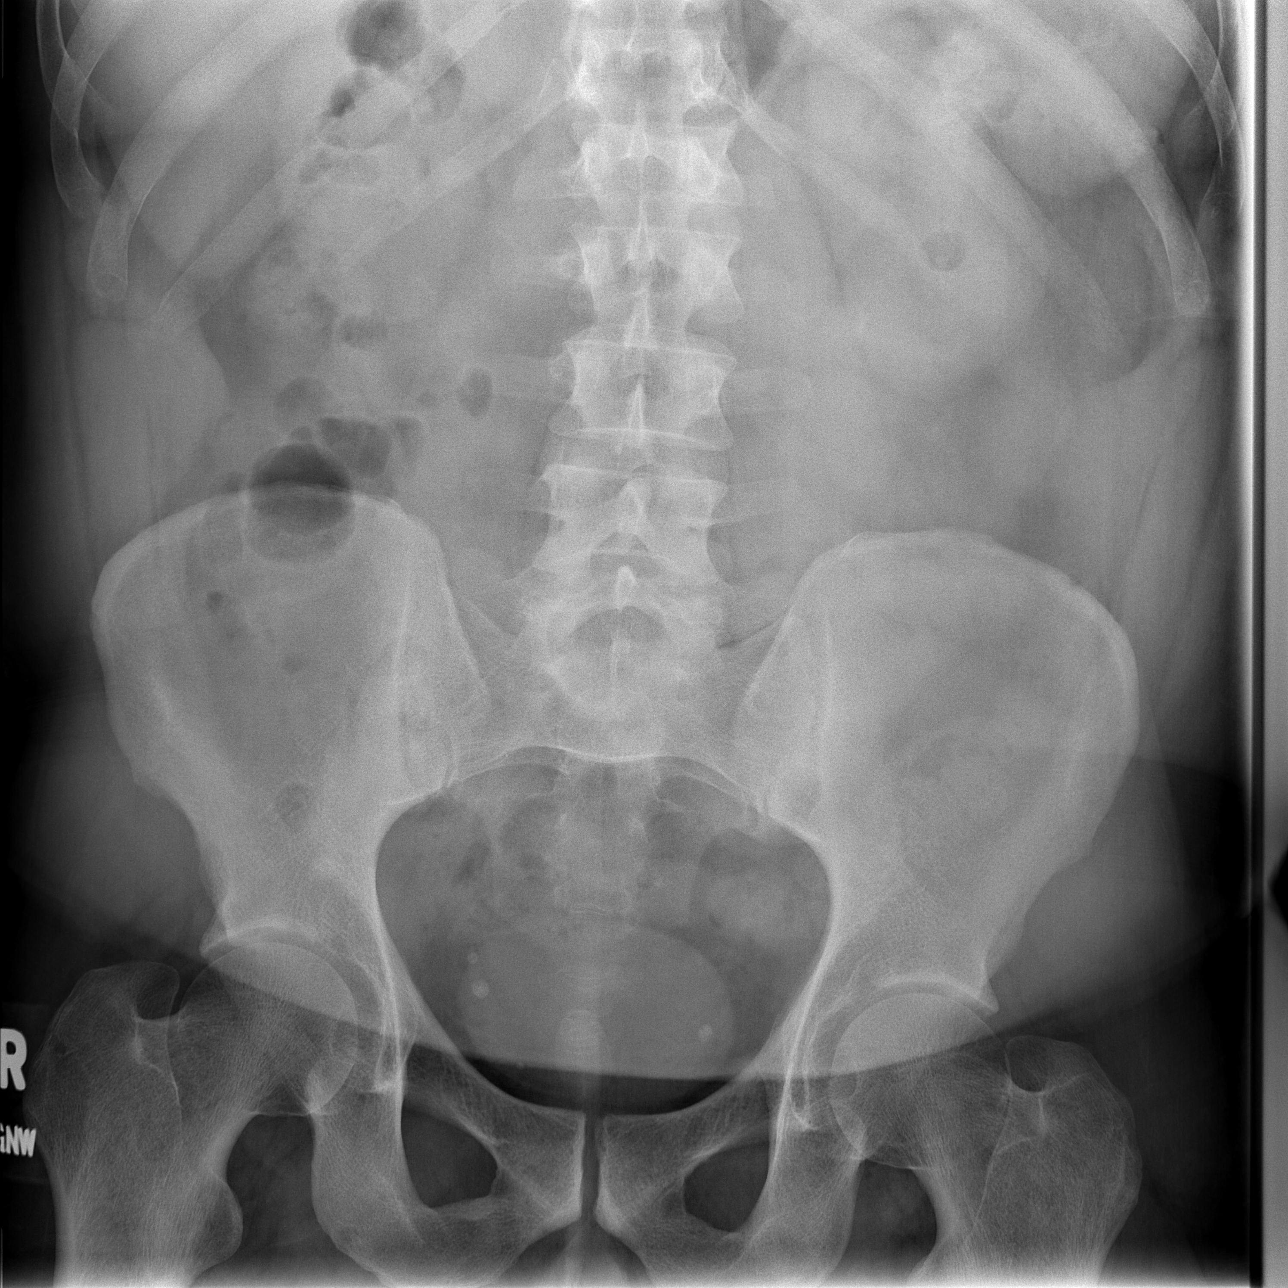

[3 of 3 positions shown; findings below may reference images not displayed]

FINDINGS: Primarily band-like opacities are present along both
hemidiaphragms.  Low lung volumes noted.  Mild cardiomegaly may be
partially attributable to these low lung volumes.

No free peroneal gas is observed beneath the hemidiaphragms.  Right
upper quadrant clips likely reflect prior cholecystectomy.  Gas and
stool noted in nondilated colon.  Paucity of bowel gas.  No
compelling findings of ascites.
IMPRESSION: 1.  Low lung volumes with band-like opacities at the lung bases
favoring atelectasis or pneumonia.
2.  Mild cardiomegaly.
3.  No specific abnormality of bowel gas pattern is observed,
although there is a paucity of gas in the small bowel.

## 2013-11-12 ENCOUNTER — Other Ambulatory Visit (HOSPITAL_COMMUNITY): Payer: Self-pay | Admitting: Family Medicine

## 2013-11-12 DIAGNOSIS — M542 Cervicalgia: Secondary | ICD-10-CM

## 2013-11-13 ENCOUNTER — Other Ambulatory Visit (HOSPITAL_COMMUNITY): Payer: Self-pay | Admitting: Family Medicine

## 2013-11-13 ENCOUNTER — Ambulatory Visit (HOSPITAL_COMMUNITY)
Admission: RE | Admit: 2013-11-13 | Discharge: 2013-11-13 | Disposition: A | Discharge status: Home / Self Care | Payer: BC Managed Care – PPO | Source: Ambulatory Visit | Attending: Family Medicine | Admitting: Family Medicine

## 2013-11-13 DIAGNOSIS — Q048 Other specified congenital malformations of brain: Secondary | ICD-10-CM | POA: Insufficient documentation

## 2013-11-13 DIAGNOSIS — M542 Cervicalgia: Secondary | ICD-10-CM

## 2013-11-13 DIAGNOSIS — G950 Syringomyelia and syringobulbia: Secondary | ICD-10-CM | POA: Insufficient documentation

## 2013-11-14 ENCOUNTER — Ambulatory Visit (HOSPITAL_COMMUNITY)
Admission: RE | Admit: 2013-11-14 | Discharge: 2013-11-14 | Disposition: A | Discharge status: Home / Self Care | Payer: BC Managed Care – PPO | Source: Ambulatory Visit | Attending: Family Medicine | Admitting: Family Medicine

## 2013-11-14 ENCOUNTER — Encounter (HOSPITAL_COMMUNITY): Payer: Self-pay | Admitting: Emergency Medicine

## 2013-11-14 ENCOUNTER — Emergency Department (HOSPITAL_COMMUNITY)
Admission: EM | Admit: 2013-11-14 | Discharge: 2013-11-14 | Disposition: A | Discharge status: Home / Self Care | Payer: BC Managed Care – PPO | Attending: Emergency Medicine | Admitting: Emergency Medicine

## 2013-11-14 DIAGNOSIS — G935 Compression of brain: Secondary | ICD-10-CM | POA: Insufficient documentation

## 2013-11-14 DIAGNOSIS — G950 Syringomyelia and syringobulbia: Secondary | ICD-10-CM | POA: Insufficient documentation

## 2013-11-14 DIAGNOSIS — L509 Urticaria, unspecified: Secondary | ICD-10-CM | POA: Insufficient documentation

## 2013-11-14 DIAGNOSIS — T7840XA Allergy, unspecified, initial encounter: Secondary | ICD-10-CM

## 2013-11-14 DIAGNOSIS — M542 Cervicalgia: Secondary | ICD-10-CM

## 2013-11-14 DIAGNOSIS — J45909 Unspecified asthma, uncomplicated: Secondary | ICD-10-CM | POA: Insufficient documentation

## 2013-11-14 DIAGNOSIS — K219 Gastro-esophageal reflux disease without esophagitis: Secondary | ICD-10-CM | POA: Insufficient documentation

## 2013-11-14 DIAGNOSIS — T50995A Adverse effect of other drugs, medicaments and biological substances, initial encounter: Secondary | ICD-10-CM | POA: Insufficient documentation

## 2013-11-14 DIAGNOSIS — Z7982 Long term (current) use of aspirin: Secondary | ICD-10-CM | POA: Insufficient documentation

## 2013-11-14 DIAGNOSIS — IMO0002 Reserved for concepts with insufficient information to code with codable children: Secondary | ICD-10-CM | POA: Insufficient documentation

## 2013-11-14 DIAGNOSIS — I10 Essential (primary) hypertension: Secondary | ICD-10-CM | POA: Insufficient documentation

## 2013-11-14 DIAGNOSIS — Z8669 Personal history of other diseases of the nervous system and sense organs: Secondary | ICD-10-CM | POA: Insufficient documentation

## 2013-11-14 DIAGNOSIS — M546 Pain in thoracic spine: Secondary | ICD-10-CM | POA: Insufficient documentation

## 2013-11-14 DIAGNOSIS — Z79899 Other long term (current) drug therapy: Secondary | ICD-10-CM | POA: Insufficient documentation

## 2013-11-14 DIAGNOSIS — E782 Mixed hyperlipidemia: Secondary | ICD-10-CM | POA: Insufficient documentation

## 2013-11-14 LAB — POCT I-STAT CREATININE: Creatinine, Ser: 0.9 mg/dL (ref 0.50–1.35)

## 2013-11-14 MED ORDER — GADOBENATE DIMEGLUMINE 529 MG/ML IV SOLN
20.0000 mL | Freq: Once | INTRAVENOUS | Status: AC | PRN
  Administered 2013-11-14: 20 mL via INTRAVENOUS

## 2013-11-14 MED ORDER — METHYLPREDNISOLONE SODIUM SUCC 125 MG IJ SOLR
125.0000 mg | Freq: Once | INTRAMUSCULAR | Status: AC
  Administered 2013-11-14: 125 mg via INTRAVENOUS
  Filled 2013-11-14: qty 2

## 2013-11-14 MED ORDER — PREDNISONE 20 MG PO TABS: 60.0000 mg | ORAL_TABLET | Freq: Every day | ORAL | Status: DC

## 2013-11-14 MED ORDER — FAMOTIDINE IN NACL 20-0.9 MG/50ML-% IV SOLN
20.0000 mg | Freq: Once | INTRAVENOUS | Status: AC
  Administered 2013-11-14: 20 mg via INTRAVENOUS
  Filled 2013-11-14: qty 50

## 2013-11-14 MED ORDER — SODIUM CHLORIDE 0.9 % IV BOLUS (SEPSIS)
1000.0000 mL | Freq: Once | INTRAVENOUS | Status: AC
  Administered 2013-11-14: 1000 mL via INTRAVENOUS

## 2013-11-14 MED ORDER — EPINEPHRINE 0.3 MG/0.3ML IJ SOAJ: 0.3000 mg | INTRAMUSCULAR | Status: DC | PRN

## 2013-11-14 MED ORDER — DIPHENHYDRAMINE HCL 50 MG/ML IJ SOLN
INTRAMUSCULAR | Status: AC
  Administered 2013-11-14: 50 mg
  Filled 2013-11-14: qty 1

## 2013-11-14 NOTE — ED Notes (Signed)
**Note De-Identified Wolfgang Obfuscation** MRI with contrast done at [facility] as outpatient at 11 am. Noticed at 2 pm, hand itching and swelling.  Within 15 minutes rash started covering body.  Came to ER once lips started swelling.

## 2013-11-14 NOTE — ED Provider Notes (Signed)
**Note De-Identified [place] Obfuscation** TIME SEEN: 3:40 PM  CHIEF COMPLAINT: Allergic reaction  HPI: Pt is a 43 y.o. male with history of hypertension, hyperlipidemia, asthma who had an MRI with contrast today at approximately 12 PM who began having redness, "whelps", difficulty breathing, itching, swelling of his lips that started at 2 PM. He already feels that his itching, rash, swelling and difficulty breathing have improved without intervention. He has never had similar symptoms. He denies that he's ever had IV contrast before. Denies any new medications soaps, lotions, detergents, foods, animal exposure. Denies any insect bite. He feels that some of his difficulty breathing may be due to anxiety. No wheezing or stridor. No difficulty swallowing his own saliva. No changes in his voice.  ROS: See HPI Constitutional: no fever  Eyes: no drainage  ENT: no runny nose   Cardiovascular:  no chest pain  Resp: no SOB  GI: no vomiting GU: no dysuria Integumentary: no rash  Allergy:  hives  Musculoskeletal: no leg swelling  Neurological: no slurred speech ROS otherwise negative  PAST MEDICAL HISTORY/PAST SURGICAL HISTORY:  Past Medical History  Diagnosis Date  . Essential hypertension, benign   . Mixed hyperlipidemia   . Sensorineural hearing loss of low frequency   . Asthma   . GERD (gastroesophageal reflux disease)   . Choledocholithiasis s/p ERCP [id number] 03/21/2012  . Chronic cholecystitis with calculus s/p lap chole [id number] 04/03/2012    MEDICATIONS:  Prior to Admission medications   Medication Sig Start Date End Date Taking? Authorizing Provider  Amlodipine-Valsartan-HCTZ 10-320-25 MG TABS Take 1 tablet by mouth daily.    Historical Provider, MD  aspirin EC 81 MG tablet Take 81 mg by mouth daily.    Historical Provider, MD  budesonide-formoterol (SYMBICORT) 160-4.5 MCG/ACT inhaler Inhale 2 puffs into the lungs 2 (two) times daily.    Historical Provider, MD  fenofibrate micronized (ANTARA) 130 MG capsule Take 130 mg by  mouth daily before breakfast.    Historical Provider, MD  Misc Natural Products (COLON CLEANSER PO) Take 1 capsule by mouth daily as needed. For constipation    Historical Provider, MD  nebivolol (BYSTOLIC) 5 MG tablet Take 5 mg by mouth daily.    Historical Provider, MD    ALLERGIES:  No Known Allergies  SOCIAL HISTORY:  History  Substance Use Topics  . Smoking status: Never Smoker   . Smokeless tobacco: Never Used  . Alcohol Use: No    FAMILY HISTORY: Family History  Problem Relation Age of Onset  . Stroke Paternal Grandfather   . Cancer Maternal Grandfather     unknown type  . Asthma Mother   . Asthma Brother   . Heart failure Maternal Grandmother   . Aneurysm Father     EXAM: BP 142/79  Pulse 93  Temp(Src) 97.5 F (36.4 C) (Oral)  Resp 18  Ht 5\' 10"  (1.778 m)  Wt 280 lb (127.007 kg)  BMI 40.18 kg/m2  SpO2 93% CONSTITUTIONAL: Alert and oriented and responds appropriately to questions. Well-appearing; well-nourished HEAD: Normocephalic EYES: Conjunctivae clear, PERRL ENT: normal nose; no rhinorrhea; moist mucous membranes; pharynx without lesions noted; Marland Kitchen Mild possible angioedema of the upper and lower lip but no swelling of the tongue, no trismus or drooling, no stridor, no voice changes, patient can swallow his own saliva NECK: Supple, no meningismus, no LAD  CARD: RRR; S1 and S2 appreciated; no murmurs, no clicks, no rubs, no gallops RESP: Normal chest excursion without splinting or tachypnea; breath sounds clear and equal bilaterally; no **Note De-Identified Flakes Obfuscation** wheezes, no rhonchi, no rales, no hypoxia or respiratory distress, or increased work of breathing ABD/GI: Normal bowel sounds; non-distended; soft, non-tender, no rebound, no guarding BACK:  The back appears normal and is non-tender to palpation, there is no CVA tenderness EXT: Normal ROM in all joints; non-tender to palpation; no edema; normal capillary refill; no cyanosis    SKIN: Normal color for age and race; warm; she does  have some scattered urticaria diffusely NEURO: Moves all extremities equally sensation to light touch intact diffusely PSYCH: The patient's mood and manner are appropriate. Grooming and personal hygiene are appropriate.  MEDICAL DECISION MAKING: Patient here with allergic reaction likely to contrast. He has hives, slight angioedema of his lips. His lungs are completely clear. Airway patent. He is swallowing his secretions and has no voice changes, stridor or wheezing. We'll give Benadryl, Solu-Medrol, Pepcid and IV fluids. We'll continue to closely monitor. At this time I feel patient does not need epinephrine given he is alert he improved without intervention. Patient agrees with this plan.  ED PROGRESS: Patient now reports that he ate 4 popcorn shrimp earlier today which may have been his allergic trigger.    4:41 PM Pt's angioedema, hives are completely resolved. He states he is feeling much better. We'll discharge with prescription for steroid burst. He is on 20 mg of steroids once a day. Have discussed with him to take 60 mg for the next 4 days and then go back to his normal 20 mg he is taking for his back pain. We'll have him use over-the-counter Benadryl and Pepcid for the next 48 hours. We'll discharge with prescription for EpiPen and instructions on how and when to use it. I discussed strict return precautions and supportive care instructions. Patient and family at bedside verbalize understanding and are comfortable with plan. Have advised him to avoid shellfish and IV contrast at this time and that he may need to followup with a allergy specialist. He has a PCP for followup.  [place], DO 11/14/13 1642

## 2013-11-14 NOTE — Discharge Instructions (Signed)
**Note De-Identified Everly Obfuscation** Please take Benadryl 50 mg every 8 hours for the next 48 hours and then as needed for itching. Please also take Pepcid 20 mg every day for the next 48 hours as well. He may start this medication tomorrow. Please take prednisone 60 mg for the next 4 days and then he may go back to your normal dose of prednisone.  Epinephrine Injection Epinephrine is a medicine given by injection to temporarily treat an emergency allergic reaction. It is also used to treat severe asthmatic attacks and other lung problems. The medicine helps to enlarge (dilate) the small breathing tubes of the lungs. A life-threatening, sudden allergic reaction that involves the whole body is called anaphylaxis. Because of potential side effects, epinephrine should only be used as directed by your caregiver. RISKS AND COMPLICATIONS Possible side effects of epinephrine injections include:  Chest pain.  Irregular or rapid heartbeat.  Shortness of breath.  Nausea.  Vomiting.  Abdominal pain or cramping.  Sweating.  Dizziness.  Weakness.  Headache.  Nervousness. Report all side effects to your caregiver. HOW TO GIVE AN EPINEPHRINE INJECTION Give the epinephrine injection immediately when symptoms of a severe reaction begin. Inject the medicine into the outer thigh or any available, large muscle. Your caregiver can teach you how to do this. You do not need to remove any clothing. After the injection, call your local emergency services (911 in [place] Even if you improve after the injection, you need to be examined at a hospital emergency department. Epinephrine works quickly, but it also wears off quickly. Delayed reactions can occur. A delayed reaction may be as serious and dangerous as the initial reaction. HOME CARE INSTRUCTIONS  Make sure you and your family know how to give an epinephrine injection.  Use epinephrine injections as directed by your caregiver. Do not use this medicine more often or in larger doses than  prescribed.  Always carry your epinephrine injection or anaphylaxis kit with you. This can be lifesaving if you have a severe reaction.  Store the medicine in a cool, dry place. If the medicine becomes discolored or cloudy, dispose of it properly and replace it with new medicine.  Check the expiration date on your medicine. It may be unsafe to use medicines past their expiration date.  Tell your caregiver about any other medicines you are taking. Some medicines can react badly with epinephrine.  Tell your caregiver about any medical conditions you have, such as diabetes, high blood pressure (hypertension), heart disease, irregular heartbeats, or if you are pregnant. SEEK IMMEDIATE MEDICAL CARE IF:  You have used an epinephrine injection. Call your local emergency services (911 in [place] Even if you improve after the injection, you need to be examined at a hospital emergency department to make sure your allergic reaction is under control. You will also be monitored for adverse effects from the medicine.  You have chest pain.  You have irregular or fast heartbeats.  You have shortness of breath.  You have severe headaches.  You have severe nausea, vomiting, or abdominal cramps.  You have severe pain, swelling, or redness in the area where you gave the injection. Document Released: 04/30/2000 Document Revised: 07/26/2011 Document Reviewed: 01/20/2011 [facility] Patient Information 2015 [place], [place]. This information is not intended to replace advice given to you by your health care provider. Make sure you discuss any questions you have with your health care provider.  Hives Hives are itchy, red, swollen areas of the skin. They can vary in size and location on **Note De-Identified Delph Obfuscation** your body. Hives can come and go for hours or several days (acute hives) or for several weeks (chronic hives). Hives do not spread from person to person (noncontagious). They may get worse with scratching, exercise, and emotional  stress. CAUSES   Allergic reaction to food, additives, or drugs.  Infections, including the common cold.  Illness, such as vasculitis, lupus, or thyroid disease.  Exposure to sunlight, heat, or cold.  Exercise.  Stress.  Contact with chemicals. SYMPTOMS   Red or white swollen patches on the skin. The patches may change size, shape, and location quickly and repeatedly.  Itching.  Swelling of the hands, feet, and face. This may occur if hives develop deeper in the skin. DIAGNOSIS  Your caregiver can usually tell what is wrong by performing a physical exam. Skin or blood tests may also be done to determine the cause of your hives. In some cases, the cause cannot be determined. TREATMENT  Mild cases usually get better with medicines such as antihistamines. Severe cases may require an emergency epinephrine injection. If the cause of your hives is known, treatment includes avoiding that trigger.  HOME CARE INSTRUCTIONS   Avoid causes that trigger your hives.  Take antihistamines as directed by your caregiver to reduce the severity of your hives. Non-sedating or low-sedating antihistamines are usually recommended. Do not drive while taking an antihistamine.  Take any other medicines prescribed for itching as directed by your caregiver.  Wear loose-fitting clothing.  Keep all follow-up appointments as directed by your caregiver. SEEK MEDICAL CARE IF:   You have persistent or severe itching that is not relieved with medicine.  You have painful or swollen joints. SEEK IMMEDIATE MEDICAL CARE IF:   You have a fever.  Your tongue or lips are swollen.  You have trouble breathing or swallowing.  You feel tightness in the throat or chest.  You have abdominal pain. These problems may be the first sign of a life-threatening allergic reaction. Call your local emergency services (911 in [place] MAKE SURE YOU:   Understand these instructions.  Will watch your condition.  Will  get help right away if you are not doing well or get worse. Document Released: 05/03/2005 Document Revised: 05/08/2013 Document Reviewed: 07/27/2011 [facility] Patient Information 2015 [place], [place]. This information is not intended to replace advice given to you by your health care provider. Make sure you discuss any questions you have with your health care provider.  Angioedema Angioedema is a sudden swelling of tissues, often of the skin. It can occur on the face or genitals or in the abdomen or other body parts. The swelling usually develops over a short period and gets better in 24 to 48 hours. It often begins during the night and is found when the person wakes up. The person may also get red, itchy patches of skin (hives). Angioedema can be dangerous if it involves swelling of the air passages.  Depending on the cause, episodes of angioedema may only happen once, come back in unpredictable patterns, or repeat for several years and then gradually fade away.  CAUSES  Angioedema can be caused by an allergic reaction to various triggers. It can also result from nonallergic causes, including reactions to drugs, immune system disorders, viral infections, or an abnormal gene that is passed to you from your parents (hereditary). For some people with angioedema, the cause is unknown.  Some things that can trigger angioedema include:   Foods.   Medicines, such as ACE inhibitors, ARBs, nonsteroidal anti-inflammatory **Note De-Identified Lollar Obfuscation** agents, or estrogen.   Latex.   Animal saliva.   Insect stings.   Dyes used in X-rays.   Mild injury.   Dental work.  Surgery.  Stress.   Sudden changes in temperature.   Exercise. SIGNS AND SYMPTOMS   Swelling of the skin.  Hives. If these are present, there is also intense itching.  Redness in the affected area.   Pain in the affected area.  Swollen lips or tongue.  Breathing problems. This may happen if the air passages swell.  Wheezing. If  internal organs are involved, there may be:   Nausea.   Abdominal pain.   Vomiting.   Difficulty swallowing.   Difficulty passing urine. DIAGNOSIS   Your health care provider will examine the affected area and take a medical and family history.  Various tests may be done to help determine the cause. Tests may include:  Allergy skin tests to see if the problem is an allergic reaction.   Blood tests to check for hereditary angioedema.   Tests to check for underlying diseases that could cause the condition.   A review of your medicines, including over the counter medicines, may be done. TREATMENT  Treatment will depend on the cause of the angioedema. Possible treatments include:   Removal of anything that triggered the condition (such as stopping certain medicines).   Medicines to treat symptoms or prevent attacks. Medicines given may include:   Antihistamines.   Epinephrine injection.   Steroids.   Hospitalization may be required for severe attacks. If the air passages are affected, it can be an emergency. Tubes may need to be placed to keep the airway open. HOME CARE INSTRUCTIONS   Only take over-the-counter or prescription medicines as directed by your health care provider.  If you were given medicines for emergency allergy treatment, always carry them with you.  Wear a medical bracelet as directed by your health care provider.   Avoid known triggers. SEEK MEDICAL CARE IF:   You have repeat attacks of angioedema.   Your attacks are more frequent or more severe despite preventive measures.   You have hereditary angioedema and are considering having children. It is important to discuss the risks of passing the condition on to your children with your health care provider. SEEK IMMEDIATE MEDICAL CARE IF:   You have severe swelling of the mouth, tongue, or lips.  You have difficulty breathing.   You have difficulty swallowing.   You  faint. MAKE SURE YOU:  Understand these instructions.  Will watch your condition.  Will get help right away if you are not doing well or get worse. Document Released: 07/12/2001 Document Revised: 02/21/2013 Document Reviewed: 12/25/2012 [facility] Patient Information 2015 [place], [place]. This information is not intended to replace advice given to you by your health care provider. Make sure you discuss any questions you have with your health care provider.

## 2013-11-14 NOTE — ED Notes (Signed)
**Note De-Identified Menard Obfuscation** Patient reports having MRI here yesterday and today. Per patient had contrast, 2 hrs later hands red, swelling, lips swelling, and slight difficulty breathing. Patient states "My hands were itching but that went away."

## 2013-11-14 NOTE — ED Notes (Signed)
**Note De-Identified Nonaka Obfuscation** Decreased noted in redness and swelling from reaction.  Pt states, "I feel a whole lot better".  Family at bedside.  Dr Leonides Schanz in

## 2013-11-14 NOTE — ED Notes (Signed)
**Note De-Identified Hogen Obfuscation** Pt states he had shellfish at 12 noon.

## 2013-11-22 ENCOUNTER — Other Ambulatory Visit: Payer: Self-pay | Admitting: Neurosurgery

## 2013-12-07 ENCOUNTER — Encounter (HOSPITAL_COMMUNITY): Payer: Self-pay | Admitting: Pharmacy Technician

## 2013-12-11 NOTE — Pre-Procedure Instructions (Signed)
**Note De-Identified Deweese Obfuscation** Derek Donaldson  12/11/2013   Your procedure is scheduled on:  Fri, Aug 7 @ 11:00 AM  Report to [name]  at 9:00 AM.  Call this number if you have problems the morning of surgery: [phone]   Remember:   Do not eat food or drink liquids after midnight.   Take these medicines the morning of surgery with A SIP OF WATER: Amlodipine(Norvasc),Symbicort(Budesonide-Formoterol)<Bring Your Inhaler With You>,and Bystolic(Nebivolol)              Stop taking your Aspirin. No Goody's,BC's,Aleve,Ibuprofen,Fish Oil,or any Herbal Medications   Do not wear jewelry.  Do not wear lotions, powders, or colognes. You may wear deodorant.  Men may shave face and neck.  Do not bring valuables to the hospital.  [facility] is not responsible                  for any belongings or valuables.               Contacts, dentures or bridgework may not be worn into surgery.  Leave suitcase in the car. After surgery it may be brought to your room.  For patients admitted to the hospital, discharge time is determined by your                treatment team.                   Special Instructions:  [facility] - Preparing for Surgery  Before surgery, you can play an important role.  Because skin is not sterile, your skin needs to be as free of germs as possible.  You can reduce the number of germs on you skin by washing with CHG (chlorahexidine gluconate) soap before surgery.  CHG is an antiseptic cleaner which kills germs and bonds with the skin to continue killing germs even after washing.  Please DO NOT use if you have an allergy to CHG or antibacterial soaps.  If your skin becomes reddened/irritated stop using the CHG and inform your nurse when you arrive at Short Stay.  Do not shave (including legs and underarms) for at least 48 hours prior to the first CHG shower.  You may shave your face.  Please follow these instructions carefully:   1.  Shower with CHG Soap the night before surgery and the                                 morning of Surgery.  2.  If you choose to wash your hair, wash your hair first as usual with your       normal shampoo.  3.  After you shampoo, rinse your hair and body thoroughly to remove the                      Shampoo.  4.  Use CHG as you would any other liquid soap.  You can apply chg directly       to the skin and wash gently with scrungie or a clean washcloth.  5.  Apply the CHG Soap to your body ONLY FROM THE NECK DOWN.        Do not use on open wounds or open sores.  Avoid contact with your eyes,       ears, mouth and genitals (private parts).  Wash genitals (private parts)       with your **Note De-Identified Fenstermaker Obfuscation** normal soap.  6.  Wash thoroughly, paying special attention to the area where your surgery        will be performed.  7.  Thoroughly rinse your body with warm water from the neck down.  8.  DO NOT shower/wash with your normal soap after using and rinsing off       the CHG Soap.  9.  Pat yourself dry with a clean towel.            10.  Wear clean pajamas.            11.  Place clean sheets on your bed the night of your first shower and do not        sleep with pets.  Day of Surgery  Do not apply any lotions/deoderants the morning of surgery.  Please wear clean clothes to the hospital/surgery center.     Please read over the following fact sheets that you were given: Pain Booklet, Coughing and Deep Breathing, Blood Transfusion Information, MRSA Information and Surgical Site Infection Prevention

## 2013-12-12 ENCOUNTER — Encounter (HOSPITAL_COMMUNITY)
Admission: RE | Admit: 2013-12-12 | Discharge: 2013-12-12 | Disposition: A | Discharge status: Home / Self Care | Payer: BC Managed Care – PPO | Source: Ambulatory Visit | Attending: Neurosurgery | Admitting: Neurosurgery

## 2013-12-12 ENCOUNTER — Encounter (HOSPITAL_COMMUNITY)
Admission: RE | Admit: 2013-12-12 | Discharge: 2013-12-12 | Disposition: A | Discharge status: Home / Self Care | Payer: BC Managed Care – PPO | Source: Ambulatory Visit | Attending: Anesthesiology | Admitting: Anesthesiology

## 2013-12-12 ENCOUNTER — Encounter (HOSPITAL_COMMUNITY): Payer: Self-pay

## 2013-12-12 DIAGNOSIS — Z01818 Encounter for other preprocedural examination: Secondary | ICD-10-CM | POA: Insufficient documentation

## 2013-12-12 DIAGNOSIS — Z01812 Encounter for preprocedural laboratory examination: Secondary | ICD-10-CM | POA: Insufficient documentation

## 2013-12-12 HISTORY — DX: Shortness of breath: R06.02

## 2013-12-12 HISTORY — DX: Pneumonia, unspecified organism: J18.9

## 2013-12-12 HISTORY — DX: Malignant (primary) neoplasm, unspecified: C80.1

## 2013-12-12 LAB — BASIC METABOLIC PANEL
Anion gap: 16 — ABNORMAL HIGH (ref 5–15)
BUN: 10 mg/dL (ref 6–23)
CHLORIDE: 102 meq/L (ref 96–112)
CO2: 25 mEq/L (ref 19–32)
CREATININE: 0.77 mg/dL (ref 0.50–1.35)
Calcium: 9.8 mg/dL (ref 8.4–10.5)
GFR calc Af Amer: >90 mL/min (ref >90)
GFR calc non Af Amer: >90 mL/min (ref >90)
GLUCOSE: 87 mg/dL (ref 70–99)
Potassium: 3.6 mEq/L — ABNORMAL LOW (ref 3.7–5.3)
Sodium: 143 mEq/L (ref 137–147)

## 2013-12-12 LAB — CBC
HCT: 45 % (ref 39.0–52.0)
Hemoglobin: 15.4 g/dL (ref 13.0–17.0)
MCH: 31.1 pg (ref 26.0–34.0)
MCHC: 34.2 g/dL (ref 30.0–36.0)
MCV: 90.9 fL (ref 78.0–100.0)
PLATELETS: 357 10*3/uL (ref 150–400)
RBC: 4.95 MIL/uL (ref 4.22–5.81)
RDW: 13 % (ref 11.5–15.5)
WBC: 7.9 10*3/uL (ref 4.0–10.5)

## 2013-12-12 LAB — SURGICAL PCR SCREEN
MRSA, PCR: NEGATIVE
Staphylococcus aureus: NEGATIVE

## 2013-12-12 LAB — ABO/RH: ABO/RH(D): O POS

## 2013-12-12 NOTE — Progress Notes (Signed)
**Note De-Identified Roehrich Obfuscation** Call again to [facility], need more updated EKG- spoke with Freda Munro.

## 2013-12-12 NOTE — Progress Notes (Signed)
**Note De-Identified Shugars Obfuscation** Call to [facility] requested EKG, recently done

## 2013-12-15 HISTORY — PX: CEREBRAL ANGIOGRAM: SHX1326

## 2013-12-20 MED ORDER — DEXTROSE 5 % IV SOLN
3.0000 g | INTRAVENOUS | Status: AC
  Administered 2013-12-21: 3 g via INTRAVENOUS
  Filled 2013-12-20: qty 3000

## 2013-12-21 ENCOUNTER — Inpatient Hospital Stay (HOSPITAL_COMMUNITY): Payer: BC Managed Care – PPO | Admitting: Anesthesiology

## 2013-12-21 ENCOUNTER — Encounter (HOSPITAL_COMMUNITY): Payer: BC Managed Care – PPO | Admitting: Anesthesiology

## 2013-12-21 ENCOUNTER — Encounter (HOSPITAL_COMMUNITY): Payer: Self-pay | Admitting: *Deleted

## 2013-12-21 ENCOUNTER — Encounter (HOSPITAL_COMMUNITY)
Admission: RE | Disposition: A | Discharge status: Home / Self Care | Payer: Self-pay | Source: Ambulatory Visit | Attending: Neurosurgery

## 2013-12-21 ENCOUNTER — Ambulatory Visit (HOSPITAL_COMMUNITY)
Admission: RE | Admit: 2013-12-21 | Discharge: 2013-12-22 | Disposition: A | Discharge status: Home / Self Care | Payer: BC Managed Care – PPO | Source: Ambulatory Visit | Attending: Neurosurgery | Admitting: Neurosurgery

## 2013-12-21 DIAGNOSIS — IMO0002 Reserved for concepts with insufficient information to code with codable children: Secondary | ICD-10-CM | POA: Diagnosis not present

## 2013-12-21 DIAGNOSIS — E7800 Pure hypercholesterolemia, unspecified: Secondary | ICD-10-CM | POA: Insufficient documentation

## 2013-12-21 DIAGNOSIS — G950 Syringomyelia and syringobulbia: Secondary | ICD-10-CM | POA: Insufficient documentation

## 2013-12-21 DIAGNOSIS — Z79899 Other long term (current) drug therapy: Secondary | ICD-10-CM | POA: Insufficient documentation

## 2013-12-21 DIAGNOSIS — Z7982 Long term (current) use of aspirin: Secondary | ICD-10-CM | POA: Diagnosis not present

## 2013-12-21 DIAGNOSIS — Z5309 Procedure and treatment not carried out because of other contraindication: Secondary | ICD-10-CM | POA: Insufficient documentation

## 2013-12-21 DIAGNOSIS — Z6841 Body Mass Index (BMI) 40.0 and over, adult: Secondary | ICD-10-CM | POA: Diagnosis not present

## 2013-12-21 DIAGNOSIS — I10 Essential (primary) hypertension: Secondary | ICD-10-CM | POA: Diagnosis not present

## 2013-12-21 DIAGNOSIS — H918X9 Other specified hearing loss, unspecified ear: Secondary | ICD-10-CM | POA: Insufficient documentation

## 2013-12-21 DIAGNOSIS — Z9089 Acquired absence of other organs: Secondary | ICD-10-CM | POA: Diagnosis not present

## 2013-12-21 DIAGNOSIS — G935 Compression of brain: Principal | ICD-10-CM | POA: Insufficient documentation

## 2013-12-21 DIAGNOSIS — K219 Gastro-esophageal reflux disease without esophagitis: Secondary | ICD-10-CM | POA: Insufficient documentation

## 2013-12-21 DIAGNOSIS — J45909 Unspecified asthma, uncomplicated: Secondary | ICD-10-CM | POA: Insufficient documentation

## 2013-12-21 DIAGNOSIS — L272 Dermatitis due to ingested food: Secondary | ICD-10-CM | POA: Diagnosis not present

## 2013-12-21 HISTORY — PX: SUBOCCIPITAL CRANIECTOMY CERVICAL LAMINECTOMY: SHX5404

## 2013-12-21 LAB — PREPARE RBC (CROSSMATCH)

## 2013-12-21 SURGERY — SUBOCCIPITAL CRANIECTOMY CERVICAL LAMINECTOMY/DURAPLASTY
Anesthesia: General

## 2013-12-21 MED ORDER — ONDANSETRON HCL 4 MG PO TABS: 4.0000 mg | ORAL_TABLET | ORAL | Status: DC | PRN

## 2013-12-21 MED ORDER — POTASSIUM CHLORIDE IN NACL 20-0.9 MEQ/L-% IV SOLN
INTRAVENOUS | Status: DC
  Administered 2013-12-21: 23:00:00 via INTRAVENOUS
  Filled 2013-12-21: qty 1000

## 2013-12-21 MED ORDER — BISACODYL 5 MG PO TBEC: 5.0000 mg | DELAYED_RELEASE_TABLET | Freq: Every day | ORAL | Status: DC | PRN

## 2013-12-21 MED ORDER — PROMETHAZINE HCL 25 MG PO TABS: 12.5000 mg | ORAL_TABLET | ORAL | Status: DC | PRN

## 2013-12-21 MED ORDER — FENOFIBRATE 160 MG PO TABS
160.0000 mg | ORAL_TABLET | Freq: Every day | ORAL | Status: DC
  Administered 2013-12-22: 160 mg via ORAL
  Filled 2013-12-21: qty 1

## 2013-12-21 MED ORDER — OXYCODONE HCL 5 MG PO TABS: 5.0000 mg | ORAL_TABLET | Freq: Once | ORAL | Status: DC | PRN

## 2013-12-21 MED ORDER — LIDOCAINE HCL (CARDIAC) 20 MG/ML IV SOLN
INTRAVENOUS | Status: DC | PRN
  Administered 2013-12-21: 30 mg via INTRAVENOUS

## 2013-12-21 MED ORDER — GLYCOPYRROLATE 0.2 MG/ML IJ SOLN
INTRAMUSCULAR | Status: DC | PRN
  Administered 2013-12-21: 0.6 mg via INTRAVENOUS

## 2013-12-21 MED ORDER — HYDROMORPHONE HCL PF 1 MG/ML IJ SOLN
0.2500 mg | INTRAMUSCULAR | Status: DC | PRN
  Administered 2013-12-21: 0.5 mg via INTRAVENOUS

## 2013-12-21 MED ORDER — LIDOCAINE-EPINEPHRINE 0.5 %-1:200000 IJ SOLN
INTRAMUSCULAR | Status: DC | PRN
  Administered 2013-12-21: 10 mL via INTRADERMAL

## 2013-12-21 MED ORDER — FLEET ENEMA 7-19 GM/118ML RE ENEM: 1.0000 | ENEMA | Freq: Once | RECTAL | Status: AC | PRN

## 2013-12-21 MED ORDER — ONDANSETRON HCL 4 MG/2ML IJ SOLN
INTRAMUSCULAR | Status: DC | PRN
  Administered 2013-12-21: 4 mg via INTRAVENOUS

## 2013-12-21 MED ORDER — HYDROCODONE-ACETAMINOPHEN 5-325 MG PO TABS
1.0000 | ORAL_TABLET | ORAL | Status: DC | PRN
  Filled 2013-12-21: qty 1

## 2013-12-21 MED ORDER — VECURONIUM BROMIDE 10 MG IV SOLR
INTRAVENOUS | Status: DC | PRN
  Administered 2013-12-21: 10 mg via INTRAVENOUS

## 2013-12-21 MED ORDER — OXYCODONE HCL 5 MG/5ML PO SOLN: 5.0000 mg | Freq: Once | ORAL | Status: DC | PRN

## 2013-12-21 MED ORDER — SODIUM CHLORIDE 0.9 % IR SOLN
Status: DC | PRN
  Administered 2013-12-21: 13:00:00

## 2013-12-21 MED ORDER — AMLODIPINE BESYLATE 10 MG PO TABS
10.0000 mg | ORAL_TABLET | Freq: Every day | ORAL | Status: DC
  Administered 2013-12-22: 10 mg via ORAL
  Filled 2013-12-21: qty 1

## 2013-12-21 MED ORDER — NEBIVOLOL HCL 5 MG PO TABS
5.0000 mg | ORAL_TABLET | Freq: Every day | ORAL | Status: DC
  Administered 2013-12-22: 5 mg via ORAL
  Filled 2013-12-21 (×3): qty 1

## 2013-12-21 MED ORDER — LACTATED RINGERS IV SOLN: INTRAVENOUS | Status: DC | PRN

## 2013-12-21 MED ORDER — SODIUM CHLORIDE 0.9 % IV SOLN: Freq: Once | INTRAVENOUS | Status: DC

## 2013-12-21 MED ORDER — BUDESONIDE-FORMOTEROL FUMARATE 160-4.5 MCG/ACT IN AERO
2.0000 | INHALATION_SPRAY | Freq: Two times a day (BID) | RESPIRATORY_TRACT | Status: DC
  Administered 2013-12-21 – 2013-12-22 (×2): 2 via RESPIRATORY_TRACT
  Filled 2013-12-21: qty 6

## 2013-12-21 MED ORDER — NEOSTIGMINE METHYLSULFATE 10 MG/10ML IV SOLN
INTRAVENOUS | Status: DC | PRN
  Administered 2013-12-21: 3 mg via INTRAVENOUS

## 2013-12-21 MED ORDER — MEPERIDINE HCL 25 MG/ML IJ SOLN: 6.2500 mg | INTRAMUSCULAR | Status: DC | PRN

## 2013-12-21 MED ORDER — SODIUM CHLORIDE 0.9 % IV SOLN
INTRAVENOUS | Status: DC | PRN
  Administered 2013-12-21 (×3): via INTRAVENOUS

## 2013-12-21 MED ORDER — THROMBIN 20000 UNITS EX SOLR
CUTANEOUS | Status: DC | PRN
  Administered 2013-12-21: 13:00:00 via TOPICAL

## 2013-12-21 MED ORDER — 0.9 % SODIUM CHLORIDE (POUR BTL) OPTIME
TOPICAL | Status: DC | PRN
  Administered 2013-12-21 (×3): 1000 mL

## 2013-12-21 MED ORDER — CEFAZOLIN SODIUM-DEXTROSE 2-3 GM-% IV SOLR
INTRAVENOUS | Status: AC
  Filled 2013-12-21: qty 50

## 2013-12-21 MED ORDER — ROCURONIUM BROMIDE 100 MG/10ML IV SOLN
INTRAVENOUS | Status: DC | PRN
  Administered 2013-12-21: 50 mg via INTRAVENOUS

## 2013-12-21 MED ORDER — MORPHINE SULFATE 2 MG/ML IJ SOLN
1.0000 mg | INTRAMUSCULAR | Status: DC | PRN
  Administered 2013-12-21: 1 mg via INTRAVENOUS
  Filled 2013-12-21: qty 1

## 2013-12-21 MED ORDER — HYDROCHLOROTHIAZIDE 25 MG PO TABS
25.0000 mg | ORAL_TABLET | Freq: Every day | ORAL | Status: DC
  Administered 2013-12-22: 25 mg via ORAL
  Filled 2013-12-21: qty 1

## 2013-12-21 MED ORDER — VALSARTAN-HYDROCHLOROTHIAZIDE 320-25 MG PO TABS: 1.0000 | ORAL_TABLET | Freq: Every day | ORAL | Status: DC

## 2013-12-21 MED ORDER — NALOXONE HCL 0.4 MG/ML IJ SOLN: 0.0800 mg | INTRAMUSCULAR | Status: DC | PRN

## 2013-12-21 MED ORDER — KETOROLAC TROMETHAMINE 30 MG/ML IJ SOLN
30.0000 mg | Freq: Four times a day (QID) | INTRAMUSCULAR | Status: DC
  Administered 2013-12-21 – 2013-12-22 (×3): 30 mg via INTRAVENOUS
  Filled 2013-12-21 (×3): qty 1

## 2013-12-21 MED ORDER — LACTATED RINGERS IV SOLN
INTRAVENOUS | Status: DC
  Administered 2013-12-21: 50 mL/h via INTRAVENOUS

## 2013-12-21 MED ORDER — POLYETHYLENE GLYCOL 3350 17 G PO PACK: 17.0000 g | PACK | Freq: Every day | ORAL | Status: DC | PRN

## 2013-12-21 MED ORDER — IRBESARTAN 300 MG PO TABS
300.0000 mg | ORAL_TABLET | Freq: Every day | ORAL | Status: DC
  Administered 2013-12-22: 300 mg via ORAL
  Filled 2013-12-21: qty 1

## 2013-12-21 MED ORDER — ASPIRIN EC 81 MG PO TBEC
81.0000 mg | DELAYED_RELEASE_TABLET | Freq: Every day | ORAL | Status: DC
  Administered 2013-12-22: 81 mg via ORAL
  Filled 2013-12-21: qty 1

## 2013-12-21 MED ORDER — ONDANSETRON HCL 4 MG/2ML IJ SOLN: 4.0000 mg | INTRAMUSCULAR | Status: DC | PRN

## 2013-12-21 MED ORDER — PROPOFOL 10 MG/ML IV BOLUS
INTRAVENOUS | Status: DC | PRN
  Administered 2013-12-21: 200 mg via INTRAVENOUS

## 2013-12-21 MED ORDER — MIDAZOLAM HCL 2 MG/2ML IJ SOLN: 0.5000 mg | Freq: Once | INTRAMUSCULAR | Status: DC | PRN

## 2013-12-21 MED ORDER — FENTANYL CITRATE 0.05 MG/ML IJ SOLN
INTRAMUSCULAR | Status: DC | PRN
  Administered 2013-12-21: 100 ug via INTRAVENOUS
  Administered 2013-12-21: 50 ug via INTRAVENOUS
  Administered 2013-12-21: 100 ug via INTRAVENOUS
  Administered 2013-12-21: 250 ug via INTRAVENOUS

## 2013-12-21 MED ORDER — PROMETHAZINE HCL 25 MG/ML IJ SOLN: 6.2500 mg | INTRAMUSCULAR | Status: DC | PRN

## 2013-12-21 MED ORDER — MIDAZOLAM HCL 5 MG/5ML IJ SOLN
INTRAMUSCULAR | Status: DC | PRN
  Administered 2013-12-21: 2 mg via INTRAVENOUS

## 2013-12-21 MED ORDER — SENNA 8.6 MG PO TABS
1.0000 | ORAL_TABLET | Freq: Two times a day (BID) | ORAL | Status: DC
  Administered 2013-12-21 – 2013-12-22 (×2): 8.6 mg via ORAL
  Filled 2013-12-21: qty 1

## 2013-12-21 MED ORDER — DEXAMETHASONE SODIUM PHOSPHATE 10 MG/ML IJ SOLN
INTRAMUSCULAR | Status: DC | PRN
  Administered 2013-12-21: 10 mg via INTRAVENOUS

## 2013-12-21 MED ORDER — DIPHENHYDRAMINE HCL 50 MG/ML IJ SOLN
INTRAMUSCULAR | Status: DC | PRN
  Administered 2013-12-21: 12.5 mg via INTRAVENOUS

## 2013-12-21 MED ORDER — HYDROMORPHONE HCL PF 1 MG/ML IJ SOLN
INTRAMUSCULAR | Status: AC
  Filled 2013-12-21: qty 1

## 2013-12-21 MED ORDER — MICROFIBRILLAR COLL HEMOSTAT EX PADS
MEDICATED_PAD | CUTANEOUS | Status: DC | PRN
  Administered 2013-12-21: 1 via TOPICAL

## 2013-12-21 SURGICAL SUPPLY — 89 items
BENZOIN TINCTURE PRP APPL 2/3 (GAUZE/BANDAGES/DRESSINGS) IMPLANT
BLADE SURG ROTATE 9660 (MISCELLANEOUS) ×6 IMPLANT
BLADE ULTRA TIP 2M (BLADE) IMPLANT
BRUSH SCRUB EZ 1% IODOPHOR (MISCELLANEOUS) ×6 IMPLANT
BUR ACORN 6.0 PRECISION (BURR) ×2 IMPLANT
BUR ACORN 6.0MM PRECISION (BURR) ×1
CANISTER SUCT 3000ML (MISCELLANEOUS) ×3 IMPLANT
CLIP TI MEDIUM 6 (CLIP) ×3 IMPLANT
CONT SPEC 4OZ CLIKSEAL STRL BL (MISCELLANEOUS) ×3 IMPLANT
CORDS BIPOLAR (ELECTRODE) IMPLANT
COVER TABLE BACK 60X90 (DRAPES) IMPLANT
DECANTER SPIKE VIAL GLASS SM (MISCELLANEOUS) ×3 IMPLANT
DRAIN SNY WOU 7FLT (WOUND CARE) IMPLANT
DRAPE LAPAROTOMY 100X72 PEDS (DRAPES) ×3 IMPLANT
DRAPE MICROSCOPE LEICA (MISCELLANEOUS) ×3 IMPLANT
DRAPE WARM FLUID 44X44 (DRAPE) ×3 IMPLANT
DRSG OPSITE POSTOP 4X6 (GAUZE/BANDAGES/DRESSINGS) ×3 IMPLANT
DRSG TELFA 3X8 NADH (GAUZE/BANDAGES/DRESSINGS) IMPLANT
DURAGUARD 06CMX08CM ×3 IMPLANT
DURAPREP 6ML APPLICATOR 50/CS (WOUND CARE) ×3 IMPLANT
ELECT BLADE 4.0 EZ CLEAN MEGAD (MISCELLANEOUS) ×3
ELECT CAUTERY BLADE 6.4 (BLADE) ×3 IMPLANT
ELECT REM PT RETURN 9FT ADLT (ELECTROSURGICAL) ×3
ELECTRODE BLDE 4.0 EZ CLN MEGD (MISCELLANEOUS) ×1 IMPLANT
ELECTRODE REM PT RTRN 9FT ADLT (ELECTROSURGICAL) ×1 IMPLANT
EVACUATOR 1/8 PVC DRAIN (DRAIN) IMPLANT
EVACUATOR SILICONE 100CC (DRAIN) IMPLANT
GAUZE SPONGE 4X4 12PLY STRL (GAUZE/BANDAGES/DRESSINGS) IMPLANT
GAUZE SPONGE 4X4 16PLY XRAY LF (GAUZE/BANDAGES/DRESSINGS) IMPLANT
GLOVE BIO SURGEON STRL SZ 6.5 (GLOVE) IMPLANT
GLOVE BIO SURGEON STRL SZ7 (GLOVE) IMPLANT
GLOVE BIO SURGEON STRL SZ7.5 (GLOVE) IMPLANT
GLOVE BIO SURGEON STRL SZ8 (GLOVE) ×3 IMPLANT
GLOVE BIO SURGEON STRL SZ8.5 (GLOVE) IMPLANT
GLOVE BIO SURGEONS STRL SZ 6.5 (GLOVE)
GLOVE BIOGEL M 8.0 STRL (GLOVE) IMPLANT
GLOVE BIOGEL PI IND STRL 7.5 (GLOVE) ×1 IMPLANT
GLOVE BIOGEL PI INDICATOR 7.5 (GLOVE) ×2
GLOVE ECLIPSE 6.5 STRL STRAW (GLOVE) ×3 IMPLANT
GLOVE ECLIPSE 7.0 STRL STRAW (GLOVE) IMPLANT
GLOVE ECLIPSE 7.5 STRL STRAW (GLOVE) IMPLANT
GLOVE ECLIPSE 8.0 STRL XLNG CF (GLOVE) IMPLANT
GLOVE ECLIPSE 8.5 STRL (GLOVE) IMPLANT
GLOVE EXAM NITRILE LRG STRL (GLOVE) IMPLANT
GLOVE EXAM NITRILE MD LF STRL (GLOVE) ×3 IMPLANT
GLOVE EXAM NITRILE XL STR (GLOVE) IMPLANT
GLOVE EXAM NITRILE XS STR PU (GLOVE) IMPLANT
GLOVE INDICATOR 6.5 STRL GRN (GLOVE) IMPLANT
GLOVE INDICATOR 7.0 STRL GRN (GLOVE) IMPLANT
GLOVE INDICATOR 7.5 STRL GRN (GLOVE) IMPLANT
GLOVE INDICATOR 8.0 STRL GRN (GLOVE) IMPLANT
GLOVE INDICATOR 8.5 STRL (GLOVE) IMPLANT
GLOVE OPTIFIT SS 8.0 STRL (GLOVE) ×3 IMPLANT
GLOVE SURG SS PI 6.5 STRL IVOR (GLOVE) IMPLANT
GLOVE SURG SS PI 7.0 STRL IVOR (GLOVE) ×6 IMPLANT
GOWN STRL REUS W/ TWL LRG LVL3 (GOWN DISPOSABLE) ×1 IMPLANT
GOWN STRL REUS W/ TWL XL LVL3 (GOWN DISPOSABLE) IMPLANT
GOWN STRL REUS W/TWL 2XL LVL3 (GOWN DISPOSABLE) IMPLANT
GOWN STRL REUS W/TWL LRG LVL3 (GOWN DISPOSABLE) ×2
GOWN STRL REUS W/TWL XL LVL3 (GOWN DISPOSABLE)
HEMOSTAT SURGICEL 2X14 (HEMOSTASIS) IMPLANT
KIT BASIN OR (CUSTOM PROCEDURE TRAY) ×3 IMPLANT
KIT ROOM TURNOVER OR (KITS) ×3 IMPLANT
MARKER SKIN DUAL TIP RULER LAB (MISCELLANEOUS) ×3 IMPLANT
NEEDLE HYPO 25X1 1.5 SAFETY (NEEDLE) ×3 IMPLANT
NS IRRIG 1000ML POUR BTL (IV SOLUTION) ×3 IMPLANT
PACK CRANIOTOMY (CUSTOM PROCEDURE TRAY) ×3 IMPLANT
PAD ARMBOARD 7.5X6 YLW CONV (MISCELLANEOUS) ×9 IMPLANT
PATTIES SURGICAL .5 X.5 (GAUZE/BANDAGES/DRESSINGS) ×3 IMPLANT
PATTIES SURGICAL .5 X1 (DISPOSABLE) IMPLANT
PATTIES SURGICAL 1/4 X 3 (GAUZE/BANDAGES/DRESSINGS) IMPLANT
RUBBERBAND STERILE (MISCELLANEOUS) IMPLANT
SPONGE LAP 4X18 X RAY DECT (DISPOSABLE) IMPLANT
SPONGE SURGIFOAM ABS GEL 100 (HEMOSTASIS) ×3 IMPLANT
STAPLER SKIN PROX WIDE 3.9 (STAPLE) IMPLANT
SUT ETHILON 3 0 FSL (SUTURE) IMPLANT
SUT NURALON 4 0 TR CR/8 (SUTURE) ×3 IMPLANT
SUT VIC AB 0 CT1 18XCR BRD8 (SUTURE) ×1 IMPLANT
SUT VIC AB 0 CT1 8-18 (SUTURE) ×2
SUT VIC AB 2-0 CT2 18 VCP726D (SUTURE) ×3 IMPLANT
SUT VIC AB 3-0 SH 8-18 (SUTURE) ×3 IMPLANT
SYR 20ML ECCENTRIC (SYRINGE) ×3 IMPLANT
SYR CONTROL 10ML LL (SYRINGE) ×3 IMPLANT
TOWEL OR 17X24 6PK STRL BLUE (TOWEL DISPOSABLE) ×3 IMPLANT
TOWEL OR 17X26 10 PK STRL BLUE (TOWEL DISPOSABLE) ×3 IMPLANT
TRAY FOLEY CATH 14FRSI W/METER (CATHETERS) IMPLANT
TRAY FOLEY CATH 16FRSI W/METER (SET/KITS/TRAYS/PACK) ×3 IMPLANT
UNDERPAD 30X30 INCONTINENT (UNDERPADS AND DIAPERS) IMPLANT
WATER STERILE IRR 1000ML POUR (IV SOLUTION) ×3 IMPLANT

## 2013-12-21 NOTE — Anesthesia Postprocedure Evaluation (Signed)
**Note De-identified Haxton Obfuscation**  **Note De-Identified Pesola Obfuscation** Anesthesia Post-op Note  Patient: Derek Donaldson  Procedure(s) Performed: Procedure(s) with comments: Aborted Cranioplasty For Chiari Malformation (Could not complete procedure) (N/A) - Aborted Cranioplasty For Chiari Malformation  Patient Location: PACU  Anesthesia Type:General  Level of Consciousness: awake, alert , oriented and patient cooperative  Airway and Oxygen Therapy: Patient Spontanous Breathing and Patient connected to nasal cannula oxygen  Post-op Pain: none  Post-op Assessment: Post-op Vital signs reviewed, Patient's Cardiovascular Status Stable, Respiratory Function Stable, Patent Airway, No signs of Nausea or vomiting and Pain level controlled  Post-op Vital Signs: Reviewed and stable  Last Vitals:  Filed Vitals:   12/21/13 1548  BP:   Pulse:   Temp: 36.3 C  Resp:     Complications: No apparent anesthesia complications

## 2013-12-21 NOTE — Transfer of Care (Signed)
**Note De-Identified Zaragosa Obfuscation** Immediate Anesthesia Transfer of Care Note  Patient: Derek Donaldson  Procedure(s) Performed: Procedure(s) with comments: Aborted Cranioplasty For Chiari Malformation (Could not complete procedure) (N/A) - Aborted Cranioplasty For Chiari Malformation  Patient Location: PACU  Anesthesia Type:General  Level of Consciousness: sedated  Airway & Oxygen Therapy: Patient Spontanous Breathing and Patient connected to face mask oxygen  Post-op Assessment: Report given to PACU RN and Post -op Vital signs reviewed and stable  Post vital signs: Reviewed and stable  Complications: No apparent anesthesia complications

## 2013-12-21 NOTE — Anesthesia Preprocedure Evaluation (Addendum)
**Note De-Identified Heppler Obfuscation** Anesthesia Evaluation  Patient identified by MRN, date of birth, ID band Patient awake    Reviewed: Allergy & Precautions, H&P , NPO status , Patient's Chart, lab work & pertinent test results, reviewed documented beta blocker date and time   History of Anesthesia Complications Negative for: history of anesthetic complications  Airway Mallampati: I TM Distance: >3 FB Neck ROM: Full    Dental  (+) Teeth Intact, Dental Advisory Given   Pulmonary shortness of breath, asthma ,  breath sounds clear to auscultation  Pulmonary exam normal       Cardiovascular hypertension, Pt. on medications and Pt. on home beta blockers Rhythm:Regular Rate:Normal  '13 Stress ECHO: normal LVF, normal perfusion, no ischemia   Neuro/Psych Chiari malformation: neck and L arm pain    GI/Hepatic Neg liver ROS, GERD-  Controlled,  Endo/Other  Morbid obesity  Renal/GU negative Renal ROS     Musculoskeletal   Abdominal (+) + obese,   Peds  Hematology   Anesthesia Other Findings   Reproductive/Obstetrics                        Anesthesia Physical Anesthesia Plan  ASA: II  Anesthesia Plan: General   Post-op Pain Management:    Induction: Intravenous  Airway Management Planned: Oral ETT  Additional Equipment:   Intra-op Plan:   Post-operative Plan: Extubation in OR  Informed Consent: I have reviewed the patients History and Physical, chart, labs and discussed the procedure including the risks, benefits and alternatives for the proposed anesthesia with the patient or authorized representative who has indicated his/her understanding and acceptance.   Dental advisory given  Plan Discussed with: CRNA and Surgeon  Anesthesia Plan Comments: (Plan routine monitors, GETA)        Anesthesia Quick Evaluation

## 2013-12-21 NOTE — Progress Notes (Signed)
**Note De-Identified Romig Obfuscation** Pt arrived to the room [facility] bed at 1615. Pt A&O x4 but remains slightly drowsy. Pt remain on 2L oxygen; IV's intact and infusing; Vitals taken; pt due to void per received report; Pt MAE x4; pt oriented to room and unit; call light within reach. Family remains at pt side. Pt pain assessed; will continue to monitor pt quietly. Francis Gaines Ticia Virgo RN.

## 2013-12-21 NOTE — H&P (Signed)
**Note De-Identified Stallings Obfuscation** BP 166/86  Pulse 68  Temp(Src) 98.2 F (36.8 C) (Oral)  Resp 20  Ht 5' 8.5" (1.74 m)  Wt 127.461 kg (281 lb)  BMI 42.10 kg/m2  SpO2 99%    HISTORY OF PRESENT ILLNESS:                     Derek Donaldson is a 43 year old gentleman who presents today for evaluation of pain, which he first experienced after coughing.  He said once he coughed on 10/01/2013, the pain shoot from the left side of his shoulder down to his elbow, and it happened a few more times that day and it has happened ever since.  He said that the pain is a little bit more constant now than it was.  He otherwise has had absolutely no neurologic deficits, problems, or concerns.  He said he underwent some x-rays at [facility] after he had the pain with coughing, which revealed nothing. He then underwent an MRI, which showed extensive cervicothoracic syrinx.  He went back to get contrast studies and it showed no evidence of a tumor, but it did show significant Chiari with the tonsils at the level of C1.  He is sent to me for further evaluation.  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  In his own words, when I cough, the pain will go to my left arm and up both sides of my neck.  He said he has soreness in his arm and in his neck.     REVIEW OF SYSTEMS:                                    Positive for hypertension, hypercholesterolemia, asthma, arm pain, and has many food allergies.  Denies constitutional, eye, ear, nose, throat, mouth, gastrointestinal, genitourinary, skin, neurological, psychiatric, endocrine, hematologic problems.     On his pain chart, he only lists pain around his neck and left upper extremity to the elbow.     PAST MEDICAL HISTORY:                                             Current Medical Conditions:  Past medical history includes hypertension.  He has low-tone hearing loss.              Prior Operations:  He has undergone a cholecystectomy.              Medications and Allergies:   HE IS ALLERGIC TO IV CONTRAST.  Medications are amlodipine, Bayer Aspirin 350 mg a day, Bystolic, Fenoglide, Symbicort, and valsartan.     FAMILY HISTORY:                                            Mother is 74 and is in good health.  Father is deceased.     SOCIAL HISTORY:                                            He does not smoke.  He does not use alcohol. **Note De-Identified Speir Obfuscation** He does not use illicit drugs.     PHYSICAL EXAMINATION:                                He is 70 inches in height and weighs 276 pounds.  BMI is 39.6.  Blood pressure 154/107, pulse is 65, temperature is 97.     On examination, he is alert and oriented x4 and answers all questions appropriately.  Memory, language, attention span and fund of knowledge are normal.  Pupils are equal, round and reactive to light.  Full extraocular movements.  Full visual fields.  Symmetric facial sensation and movement.  Hearing intact to finger rub bilaterally.  Uvula elevates in midline.  Shoulder shrug is normal.  Tongue protrudes in the midline.  5/5 strength in both upper and lower extremities.  Normal muscle tone, bulk, and coordination.  2+ reflexes in biceps, triceps, brachioradialis, knees, and ankles.  He can tandem walk with ease. Romberg test is negative.  Intact proprioception in both the upper and lower extremities.  Intact light touch.  No clonus.  No Hoffmann's.  Toes are downgoing to plantar stimulation.     IMAGING STUDIES:                                          MRI shows an extensive cervicothoracic syrinx, which goes to the level of T10.  Good deal of cord volume loss on these studies.  Absolutely no enhancement with contrast and a very tight posterior fossa secondary to the cerebellar tonsils.    DIAGNOSES:  1.     Chiari malformation.  2.     Cervicothoracic syrinx.    PLAN:                                                               I explained to [name] that I believe he needs to undergo the surgery but this is a very  significant syrinx that we believe at this point that just decompressing the Chiari should allow the syrinx to resolve.  He agrees and is going to check his calendar and then come back for the surgery.  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  I don't see any other thing that is worrisome about that.

## 2013-12-21 NOTE — Progress Notes (Signed)
**Note De-Identified Badon Obfuscation** Pt OOB to BR to void; Pt voided 655ml urine. Francis Gaines Mazell Aylesworth RN.

## 2013-12-21 NOTE — Op Note (Signed)
**Note De-Identified Defeo Obfuscation** 12/21/2013  2:37 PM  PATIENT:  Derek Donaldson  43 y.o. 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 He was taken to the operating room for a Suboccipital decompression, C1 laminectomy, and duraplasty.  PRE-OPERATIVE DIAGNOSIS:  chiari malformation  POST-OPERATIVE DIAGNOSIS:  chiari malformation  PROCEDURE:  Procedure(s): Aborted Cranioplasty For Chiari Malformation (Could not complete procedure)  SURGEON:  Surgeon(s): Ashok Pall, MD Newman Pies, MD  ASSISTANTS:Jenkins, Dellis Filbert  ANESTHESIA:   general  EBL:  Total I/O In: 2000 [I.V.:2000] Out: 1040 [Urine:140; Blood:900]  BLOOD ADMINISTERED:none  CELL SAVER GIVEN:none  COUNT:per nursing  DRAINS: none   SPECIMEN:  No Specimen  DICTATION: Derek Donaldson was taken to the operating room, intubated, and placed under a general anesthetic without difficulty.  A foley catheter was placed once he was asleep. Once anesthesia was adequate I placed a three pin Mayfield head holder. He was then positioned prone on the operating room table with all pressure points padded. His head was shaved prepped and draped in a sterile manner. I infiltrated lidocaine into the planned incision. I opened the scalp with a 10 blade and dissected with the monopolar cautery to expose the C2,1 lamina, and the occiput. I ran into a good deal of venous bleeding from the skull when reflecting the muscular attachments bilaterally. I dissected the C1 lamina, then performed a C1 laminectomy without incident. However when using the drill to perform the craniectomy I encountered a prodigious amount of bleeding which was both arterial and venous. I did not appear to be at the sinus, and I did not go through the bone. Dr. Arnoldo Morale and I were able to control the bleeding, but I did not feel completing the craniectomy at this time was wise. The bleeding was quite unusual, and his hematocrit did drop 10 points during the case. I chose to close at that point.  We closed in a layered fashion, approximating the deep cervical fascia, subcutaneous, and subcuticular planes with vicryl and staples for the skin.    PLAN OF CARE: Admit for overnight observation  PATIENT DISPOSITION:  PACU - hemodynamically stable.   Delay start of Pharmacological VTE agent (>24hrs) due to surgical blood loss or risk of bleeding:  yes

## 2013-12-21 NOTE — Anesthesia Procedure Notes (Signed)
**Note De-Identified Junker Obfuscation** Procedure Name: Intubation Date/Time: 12/21/2013 11:55 AM Performed by: Marinda Elk A Pre-anesthesia Checklist: Patient identified, Timeout performed, Emergency Drugs available, Suction available and Patient being monitored Patient Re-evaluated:Patient Re-evaluated prior to inductionOxygen Delivery Method: Circle system utilized Preoxygenation: Pre-oxygenation with 100% oxygen Intubation Type: IV induction Ventilation: Mask ventilation without difficulty Tube type: Oral Tube size: 7.5 mm Number of attempts: 1 Airway Equipment and Method: Video-laryngoscopy Placement Confirmation: ETT inserted through vocal cords under direct vision,  breath sounds checked- equal and bilateral and positive ETCO2 Secured at: 22 cm Tube secured with: Tape Dental Injury: Teeth and Oropharynx as per pre-operative assessment

## 2013-12-22 DIAGNOSIS — G935 Compression of brain: Secondary | ICD-10-CM | POA: Diagnosis not present

## 2013-12-22 MED ORDER — OXYCODONE-ACETAMINOPHEN 5-325 MG PO TABS: 1.0000 | ORAL_TABLET | Freq: Four times a day (QID) | ORAL | Status: DC | PRN

## 2013-12-22 MED ORDER — CYCLOBENZAPRINE HCL 10 MG PO TABS: 10.0000 mg | ORAL_TABLET | Freq: Three times a day (TID) | ORAL | Status: DC | PRN

## 2013-12-22 NOTE — Discharge Instructions (Signed)
**Note De-identified Amaker Obfuscation** Craniotomy °Care After °Please read the instructions outlined below and refer to this sheet in the next few weeks. These discharge instructions provide you with general information on caring for yourself after you leave the hospital. Your surgeon may also give you specific instructions. While your treatment has been planned according to the most current medical practices available, unavoidable complications occasionally occur. If you have any problems or questions after discharge, please call your surgeon. °Although there are many types of brain surgery, recovery following craniotomy (surgical opening of the skull) is much the same for each. However, recovery depends on many factors. These include the type and severity of brain injury and the type of surgery. It also depends on any nervous system function problems (neurological deficits) before surgery. If the craniotomy was done for cancer, chemotherapy and radiation could follow. You could be in the hospital from 5 days to a couple weeks. This depends on the type of surgery, findings, and whether there are complications. °HOME CARE INSTRUCTIONS  °· It is not unusual to hear a clicking noise after a craniotomy, the plates and screws used to attach the bone flap can sometimes cause this. It is a normal occurrence if this does happen °· Do not drive for 10 days after the operation °· Your scalp may feel spongy for a while, because of fluid under it. This will gradually get better. Occasionally, the surgeon will not replace the bone that was removed to access the brain. If there is a bony defect, the surgeon will ask you to wear a helmet for protection. This is a discussion you should have with your surgeon prior to leaving the hospital (discharge). °· Numbness may persist in some areas of your scalp. °· Take all medications as directed. Sometimes steroids to control swelling are prescribed. Anticonvulsants to prevent seizures may also be given. Do not use alcohol,  other drugs, or medications unless your surgeon says it is OK. °· Keep the wound dry and clean. The wound may be washed gently with soap and water. Then, you may gently blot or dab it dry, without rubbing. Do not take baths, use swimming pools or hot tubs for 10 days, or as instructed by your caregiver. It is best to wait to see you surgeon at your first postoperative visit, and to get directions at that time. °· Only take over-the-counter or prescription medicines for pain, discomfort, or fever as directed by your caregiver. °· You may continue your normal diet, as directed. °· Walking is OK for exercise. Wait at least 3 months before you return to mild, non-contact sports or as your surgeon suggests. Contact sports should be avoided for at least 1 year, unless your surgeon says it is OK. °· If you are prescribed steroids, take them exactly as prescribed. If you start having a decrease in nervous system functions (neurological deficits) and headaches as the dose of steroids is reduced, tell your surgeon right away. °· When the anticonvulsant prescription is finished you no longer need to take it. °SEEK IMMEDIATE MEDICAL CARE IF:  °· You develop nausea, vomiting, severe headaches, confusion, or you have a seizure. °· You develop chest pain, a stiff neck, or difficulty breathing. °· There is redness, swelling, or increasing pain in the wound or pin insertion sites. °· You have an increase in swelling or bruising around the eyes. °· There is drainage or pus coming from the wound. °· You have an oral temperature above 102° F (38.9° C), not controlled by medicine. °·  **Note De-identified Hayward Obfuscation** You notice a foul smell coming from the wound or dressing. °· The wound breaks open (edges not staying together) after the stitches have been removed. °· You develop dizziness or fainting while standing. °· You develop a rash. °· You develop any reaction or side effects to the medications given. °Document Released: 08/03/2005 Document Revised: 07/26/2011  Document Reviewed: 05/12/2009 °ExitCare® Patient Information ©2013 [employer]. ° °

## 2013-12-22 NOTE — Discharge Summary (Signed)
**Note De-identified Maund Obfuscation**  **Note De-Identified Garmon Obfuscation** Physician Discharge Summary  Patient ID: Derek Donaldson MRN: [id number] DOB/AGE: 11-14-1970 43 y.o.  Admit date: 12/21/2013 Discharge date: 12/22/2013  Admission Diagnoses:Chiari 1 malformation, Cervicothoracic syrinx  Discharge Diagnoses:  Active Problems:   Chiari malformation type I   Discharged Condition: good  Hospital Course: [name] was admitted yesterday for a  Chiari decompression. During the operation I ran into significant bleeding from the occiput. I was unable to remove skull without prodigious bleeding. I therefore aborted the case, in order to do more testing before his redo operation. I did perform the C1 laminectomy without difficulty. The dura was not opened. He, at discharge, is normal neurologically. My office will contact him to arrange the angiogram.   Treatments: surgery: as above  Discharge Exam: Blood pressure 145/95, pulse 99, temperature 98.4 F (36.9 C), temperature source Oral, resp. rate 20, height 5' 8.5" (1.74 m), weight 127.461 kg (281 lb), SpO2 95.00%. General appearance: alert, cooperative, appears stated age and no distress Neurologic: Alert and oriented X 3, normal strength and tone. Normal symmetric reflexes. Normal coordination and gait  Disposition: 01-Home or Self Care chiari malformation    Medication List         acetaminophen 500 MG tablet  Commonly known as:  TYLENOL  Take 1,000 mg by mouth every 6 (six) hours as needed.     amLODipine 10 MG tablet  Commonly known as:  NORVASC  Take 10 mg by mouth daily before breakfast.     ANTARA 130 MG capsule  Generic drug:  fenofibrate micronized  Take 130 mg by mouth daily before breakfast.     aspirin EC 81 MG tablet  Take 81 mg by mouth daily.     budesonide-formoterol 160-4.5 MCG/ACT inhaler  Commonly known as:  SYMBICORT  Inhale 2 puffs into the lungs 2 (two) times daily as needed (for shortness of breath).     cyclobenzaprine 10 MG tablet  Commonly known as:  FLEXERIL  Take 1  tablet (10 mg total) by mouth 3 (three) times daily as needed for muscle spasms.     EPINEPHrine 0.3 mg/0.3 mL Soaj injection  Commonly known as:  EPIPEN  Inject 0.3 mLs (0.3 mg total) into the muscle as needed.     nebivolol 5 MG tablet  Commonly known as:  BYSTOLIC  Take 5 mg by mouth daily before breakfast.     oxyCODONE-acetaminophen 5-325 MG per tablet  Commonly known as:  ROXICET  Take 1 tablet by mouth every 6 (six) hours as needed for severe pain.     valsartan-hydrochlorothiazide 320-25 MG per tablet  Commonly known as:  DIOVAN-HCT  Take 1 tablet by mouth daily before breakfast.           Follow-up Information   Follow up with Tryce Surratt L, MD. (the office will call you for the angiogram.)    Specialty:  Neurosurgery   Contact information:   [address] [place] [address] [phone]       Signed: Deadra Diggins L 12/22/2013, 8:18 AM

## 2013-12-22 NOTE — Progress Notes (Signed)
**Note De-Identified Gura Obfuscation** Discharge orders received. Neuro intact. IVs removed, surgical dressing intact and patient given instructions on surgical site care. Medications reviewed and prescription given to patient. Patient instructed to follow up with Dr Alphonzo Dublin office regarding scheduling angiogram and staple removal. Discharged home with wife Pung wheelchair. Austynn Pridmore, [place] Marie, RN 12:06 PM

## 2013-12-24 ENCOUNTER — Encounter (HOSPITAL_COMMUNITY): Payer: Self-pay | Admitting: Neurosurgery

## 2013-12-24 LAB — TYPE AND SCREEN
ABO/RH(D): O POS
Antibody Screen: NEGATIVE
UNIT DIVISION: 0
Unit division: 0

## 2013-12-25 LAB — POCT I-STAT 4, (NA,K, GLUC, HGB,HCT)
Glucose, Bld: 106 mg/dL — ABNORMAL HIGH (ref 70–99)
HEMATOCRIT: 35 % — AB (ref 39.0–52.0)
Hemoglobin: 11.9 g/dL — ABNORMAL LOW (ref 13.0–17.0)
Potassium: 3.5 mEq/L — ABNORMAL LOW (ref 3.7–5.3)
Sodium: 142 mEq/L (ref 137–147)

## 2014-01-09 ENCOUNTER — Telehealth (HOSPITAL_COMMUNITY): Payer: Self-pay | Admitting: Interventional Radiology

## 2014-01-09 ENCOUNTER — Other Ambulatory Visit (HOSPITAL_COMMUNITY): Payer: Self-pay | Admitting: Neurosurgery

## 2014-01-09 DIAGNOSIS — IMO0002 Reserved for concepts with insufficient information to code with codable children: Secondary | ICD-10-CM

## 2014-01-09 NOTE — Telephone Encounter (Signed)
**Note De-Identified Loughry Obfuscation** Called pt, left VM for him to call and schedule angio JM

## 2014-01-10 ENCOUNTER — Other Ambulatory Visit: Payer: Self-pay | Admitting: Radiology

## 2014-01-10 ENCOUNTER — Encounter (HOSPITAL_COMMUNITY): Payer: Self-pay | Admitting: Pharmacy Technician

## 2014-01-11 ENCOUNTER — Ambulatory Visit (HOSPITAL_COMMUNITY)
Admission: RE | Admit: 2014-01-11 | Discharge: 2014-01-11 | Disposition: A | Discharge status: Home / Self Care | Payer: BC Managed Care – PPO | Source: Ambulatory Visit | Attending: Neurosurgery | Admitting: Neurosurgery

## 2014-01-11 ENCOUNTER — Other Ambulatory Visit (HOSPITAL_COMMUNITY): Payer: Self-pay | Admitting: Neurosurgery

## 2014-01-11 ENCOUNTER — Encounter (HOSPITAL_COMMUNITY): Payer: Self-pay

## 2014-01-11 DIAGNOSIS — I671 Cerebral aneurysm, nonruptured: Secondary | ICD-10-CM | POA: Insufficient documentation

## 2014-01-11 DIAGNOSIS — K219 Gastro-esophageal reflux disease without esophagitis: Secondary | ICD-10-CM | POA: Diagnosis not present

## 2014-01-11 DIAGNOSIS — IMO0002 Reserved for concepts with insufficient information to code with codable children: Secondary | ICD-10-CM

## 2014-01-11 DIAGNOSIS — J45909 Unspecified asthma, uncomplicated: Secondary | ICD-10-CM | POA: Diagnosis not present

## 2014-01-11 DIAGNOSIS — G935 Compression of brain: Secondary | ICD-10-CM | POA: Diagnosis present

## 2014-01-11 DIAGNOSIS — I10 Essential (primary) hypertension: Secondary | ICD-10-CM | POA: Insufficient documentation

## 2014-01-11 DIAGNOSIS — E782 Mixed hyperlipidemia: Secondary | ICD-10-CM | POA: Insufficient documentation

## 2014-01-11 DIAGNOSIS — H9319 Tinnitus, unspecified ear: Secondary | ICD-10-CM | POA: Insufficient documentation

## 2014-01-11 LAB — BASIC METABOLIC PANEL
ANION GAP: 14 (ref 5–15)
BUN: 14 mg/dL (ref 6–23)
CHLORIDE: 103 meq/L (ref 96–112)
CO2: 21 mEq/L (ref 19–32)
CREATININE: 0.7 mg/dL (ref 0.50–1.35)
Calcium: 9.8 mg/dL (ref 8.4–10.5)
GFR calc Af Amer: >90 mL/min (ref >90)
GFR calc non Af Amer: >90 mL/min (ref >90)
Glucose, Bld: 147 mg/dL — ABNORMAL HIGH (ref 70–99)
POTASSIUM: 3.9 meq/L (ref 3.7–5.3)
Sodium: 138 mEq/L (ref 137–147)

## 2014-01-11 LAB — CBC WITH DIFFERENTIAL/PLATELET
BASOS PCT: 0 % (ref 0–1)
Basophils Absolute: 0 10*3/uL (ref 0.0–0.1)
Eosinophils Absolute: 0 10*3/uL (ref 0.0–0.7)
Eosinophils Relative: 0 % (ref 0–5)
HCT: 39 % (ref 39.0–52.0)
HEMOGLOBIN: 13.2 g/dL (ref 13.0–17.0)
Lymphocytes Relative: 8 % — ABNORMAL LOW (ref 12–46)
Lymphs Abs: 0.7 10*3/uL (ref 0.7–4.0)
MCH: 30.2 pg (ref 26.0–34.0)
MCHC: 33.8 g/dL (ref 30.0–36.0)
MCV: 89.2 fL (ref 78.0–100.0)
MONOS PCT: 2 % — AB (ref 3–12)
Monocytes Absolute: 0.1 10*3/uL (ref 0.1–1.0)
NEUTROS ABS: 7.6 10*3/uL (ref 1.7–7.7)
NEUTROS PCT: 90 % — AB (ref 43–77)
Platelets: 328 10*3/uL (ref 150–400)
RBC: 4.37 MIL/uL (ref 4.22–5.81)
RDW: 12.8 % (ref 11.5–15.5)
WBC: 8.4 10*3/uL (ref 4.0–10.5)

## 2014-01-11 LAB — PROTIME-INR
INR: 1.06 (ref 0.00–1.49)
PROTHROMBIN TIME: 13.8 s (ref 11.6–15.2)

## 2014-01-11 LAB — APTT: APTT: 28 s (ref 24–37)

## 2014-01-11 MED ORDER — IOHEXOL 300 MG/ML  SOLN
150.0000 mL | Freq: Once | INTRAMUSCULAR | Status: AC | PRN
  Administered 2014-01-11: 110 mL via INTRA_ARTERIAL

## 2014-01-11 MED ORDER — HYDRALAZINE HCL 20 MG/ML IJ SOLN
5.0000 mg | Freq: Once | INTRAMUSCULAR | Status: AC
  Administered 2014-01-11: 5 mg via INTRAVENOUS

## 2014-01-11 MED ORDER — HYDRALAZINE HCL 20 MG/ML IJ SOLN
INTRAMUSCULAR | Status: AC
  Filled 2014-01-11: qty 1

## 2014-01-11 MED ORDER — HEPARIN SODIUM (PORCINE) 1000 UNIT/ML IJ SOLN
1000.0000 [IU] | Freq: Once | INTRAMUSCULAR | Status: AC
  Administered 2014-01-11: 1000 [IU] via INTRAVENOUS

## 2014-01-11 MED ORDER — FENTANYL CITRATE 0.05 MG/ML IJ SOLN
INTRAMUSCULAR | Status: AC
  Filled 2014-01-11: qty 2

## 2014-01-11 MED ORDER — MIDAZOLAM HCL 2 MG/2ML IJ SOLN
INTRAMUSCULAR | Status: AC | PRN
  Administered 2014-01-11: 1 mg via INTRAVENOUS
  Administered 2014-01-11: 0.5 mg via INTRAVENOUS

## 2014-01-11 MED ORDER — MIDAZOLAM HCL 2 MG/2ML IJ SOLN
INTRAMUSCULAR | Status: AC
  Filled 2014-01-11: qty 2

## 2014-01-11 MED ORDER — SODIUM CHLORIDE 0.9 % IV SOLN: INTRAVENOUS | Status: DC

## 2014-01-11 MED ORDER — FENTANYL CITRATE 0.05 MG/ML IJ SOLN
INTRAMUSCULAR | Status: AC | PRN
  Administered 2014-01-11: 25 ug via INTRAVENOUS
  Administered 2014-01-11: 12.5 ug via INTRAVENOUS

## 2014-01-11 MED ORDER — DIPHENHYDRAMINE HCL 25 MG PO TABS: 50.0000 mg | ORAL_TABLET | Freq: Once | ORAL | Status: AC

## 2014-01-11 MED ORDER — LIDOCAINE HCL 1 % IJ SOLN
INTRAMUSCULAR | Status: AC
  Filled 2014-01-11: qty 20

## 2014-01-11 MED ORDER — DIPHENHYDRAMINE HCL 25 MG PO CAPS
ORAL_CAPSULE | ORAL | Status: AC
  Administered 2014-01-11: 50 mg via ORAL
  Filled 2014-01-11: qty 2

## 2014-01-11 MED ORDER — SODIUM CHLORIDE 0.9 % IV SOLN: INTRAVENOUS | Status: AC

## 2014-01-11 MED ORDER — HEPARIN SODIUM (PORCINE) 1000 UNIT/ML IJ SOLN
INTRAMUSCULAR | Status: AC
  Filled 2014-01-11: qty 1

## 2014-01-11 NOTE — Sedation Documentation (Signed)
**Note De-Identified Sandler Obfuscation** NEURO STATUS AND PULSE UNCHANGED FROM EARLIER ASSESSMENT

## 2014-01-11 NOTE — Procedures (Signed)
**Note De-Identified Mia Obfuscation** S/P 4 vessel cerebral arteriogram ,Rt external carotid arteriogram Rt CFA approach. Findings. 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 Antegrade and retrograde Lt sided venous drainage,corticaldrainage into vein of [place].

## 2014-01-11 NOTE — Sedation Documentation (Signed)
**Note De-Identified Kohler Obfuscation** NEURO CHECKS AS FOLLOWS Derek Donaldson, Derek Donaldson X4, BI LAT HAND GRASP , PULSES 2+ BLLAT

## 2014-01-11 NOTE — H&P (Signed)
**Note De-Identified Swiss Obfuscation** Derek Donaldson is an 43 y.o. male.   Chief Complaint:  Pt developed cough and pain down left arm approx 1 mo ago. Work up revealed Chiari 1 malformation Surgery with Dr Christella Noa 8/7 had to be aborted secondary bleeding. Pt now returns for cerebral arteriogram for evaluation of arterial anatomy prior to additional surgery.  Pt has been pre medicated for contrast allergy  HPI: HTN; HLD; Asthma; skin ca  Past Medical History  Diagnosis Date  . Essential hypertension, benign   . Mixed hyperlipidemia   . Sensorineural hearing loss of low frequency   . Asthma   . Choledocholithiasis s/p ERCP [id number] 03/21/2012  . Chronic cholecystitis with calculus s/p lap chole [id number] 04/03/2012  . Shortness of breath     /w "over exertion"  . Pneumonia 2005    in [facility].- x2 days   . GERD (gastroesophageal reflux disease)     prior to Cholecystectomy  . Cancer 2010    skin ( unsure of the pathology)- removed  . History of stress test 2013    normal results    Past Surgical History  Procedure Laterality Date  . Stapedectomy  04/23/2003    Revision left stapedectomy  . Vasectomy    . Ercp  03/21/2012    Procedure: ENDOSCOPIC RETROGRADE CHOLANGIOPANCREATOGRAPHY (ERCP);  Surgeon: Jeryl Columbia, MD;  Location: [facility];  Service: Endoscopy;  Laterality: N/A;  aqntibiotics and type and screen  . Cholecystectomy  11/13  . Suboccipital craniectomy cervical laminectomy N/A 12/21/2013    Procedure: Aborted Cranioplasty For Chiari Malformation (Could not complete procedure);  Surgeon: Ashok Pall, MD;  Location: [facility];  Service: Neurosurgery;  Laterality: N/A;  Aborted Cranioplasty For Chiari Malformation    Family History  Problem Relation Age of Onset  . Stroke Paternal Grandfather   . Cancer Maternal Grandfather     unknown type  . Asthma Mother   . Asthma Brother   . Heart failure Maternal Grandmother   . Aneurysm Father    Social History:  reports that he has never smoked. He has  never used smokeless tobacco. He reports that he does not drink alcohol or use illicit drugs.  Allergies:  Allergies  Allergen Reactions  . Contrast Media [Iodinated Diagnostic Agents] Anaphylaxis  . Shellfish Allergy Other (See Comments)    Child hood allergy     (Not in a hospital admission)  Results for orders placed during the hospital encounter of 01/11/14 (from the past 48 hour(s))  APTT     Status: None   Collection Time    01/11/14  6:56 AM      Result Value Ref Range   aPTT 28  24 - 37 seconds  BASIC METABOLIC PANEL     Status: Abnormal   Collection Time    01/11/14  6:56 AM      Result Value Ref Range   Sodium 138  137 - 147 mEq/L   Potassium 3.9  3.7 - 5.3 mEq/L   Chloride 103  96 - 112 mEq/L   CO2 21  19 - 32 mEq/L   Glucose, Bld 147 (*) 70 - 99 mg/dL   BUN 14  6 - 23 mg/dL   Creatinine, Ser 0.70  0.50 - 1.35 mg/dL   Calcium 9.8  8.4 - 10.5 mg/dL   GFR calc non Af Amer >90  >90 mL/min   GFR calc Af Amer >90  >90 mL/min   Comment: (NOTE)     The eGFR **Note De-Identified Cleary Obfuscation** has been calculated using the CKD EPI equation.     This calculation has not been validated in all clinical situations.     eGFR's persistently <90 mL/min signify possible Chronic Kidney     Disease.   Anion gap 14  5 - 15  CBC WITH DIFFERENTIAL     Status: Abnormal   Collection Time    01/11/14  6:56 AM      Result Value Ref Range   WBC 8.4  4.0 - 10.5 K/uL   RBC 4.37  4.22 - 5.81 MIL/uL   Hemoglobin 13.2  13.0 - 17.0 g/dL   HCT 39.0  39.0 - 52.0 %   MCV 89.2  78.0 - 100.0 fL   MCH 30.2  26.0 - 34.0 pg   MCHC 33.8  30.0 - 36.0 g/dL   RDW 12.8  11.5 - 15.5 %   Platelets 328  150 - 400 K/uL   Neutrophils Relative % 90 (*) 43 - 77 %   Neutro Abs 7.6  1.7 - 7.7 K/uL   Lymphocytes Relative 8 (*) 12 - 46 %   Lymphs Abs 0.7  0.7 - 4.0 K/uL   Monocytes Relative 2 (*) 3 - 12 %   Monocytes Absolute 0.1  0.1 - 1.0 K/uL   Eosinophils Relative 0  0 - 5 %   Eosinophils Absolute 0.0  0.0 - 0.7 K/uL   Basophils  Relative 0  0 - 1 %   Basophils Absolute 0.0  0.0 - 0.1 K/uL  PROTIME-INR     Status: None   Collection Time    01/11/14  6:56 AM      Result Value Ref Range   Prothrombin Time 13.8  11.6 - 15.2 seconds   INR 1.06  0.00 - 1.49   No results found.  Review of Systems  Constitutional: Negative for fever and weight loss.  Respiratory: Positive for cough.   Cardiovascular: Negative for chest pain.  Gastrointestinal: Negative for nausea, vomiting and abdominal pain.  Musculoskeletal: Positive for neck pain.  Neurological: Negative for dizziness, weakness and headaches.  Psychiatric/Behavioral: Negative for substance abuse.    Blood pressure 147/96, pulse 102, temperature 97.6 F (36.4 C), temperature source Oral, resp. rate 18, height 5' 10.5" (1.791 m), weight 127.914 kg (282 lb), SpO2 97.00%. Physical Exam  Constitutional: He is oriented to person, place, and time.  Neck:  Skin staples posterior skull- surgical site Healing well  Cardiovascular: Normal rate and regular rhythm.   No murmur heard. Respiratory: Effort normal and breath sounds normal. He has no wheezes.  GI: Soft. Bowel sounds are normal. There is no tenderness.  Musculoskeletal: Normal range of motion.  Neurological: He is alert and oriented to person, place, and time.  Skin: Skin is warm.  Psychiatric: He has a normal mood and affect. His behavior is normal. Judgment and thought content normal.     Assessment/Plan Chiari 1 malformation Surgery attempted 12/21/13 Aborted secondary bleeding Now scheduled for cerebral arteriogram prior to re attempt at surgery Pt and wife aware of procedure benefits and risks and agreeable to proceed Consent signed and in chart  Derek Donaldson A 01/11/2014, 7:57 AM

## 2014-01-11 NOTE — Discharge Instructions (Signed)
**Note De-identified Gotwalt Obfuscation** Angiogram, Care After °Refer to this sheet in the next few weeks. These instructions provide you with information on caring for yourself after your procedure. Your health care provider may also give you more specific instructions. Your treatment has been planned according to current medical practices, but problems sometimes occur. Call your health care provider if you have any problems or questions after your procedure.  °WHAT TO EXPECT AFTER THE PROCEDURE °After your procedure, it is typical to have the following sensations: °· Minor discomfort or tenderness and a small bump at the catheter insertion site. The bump should usually decrease in size and tenderness within 1 to 2 weeks. °· Any bruising will usually fade within 2 to 4 weeks. °HOME CARE INSTRUCTIONS  °· You may need to keep taking blood thinners if they were prescribed for you. Take medicines only as directed by your health care provider. °· Do not apply powder or lotion to the site. °· Do not take baths, swim, or use a hot tub until your health care provider approves. °· You may shower 24 hours after the procedure. Remove the bandage (dressing) and gently wash the site with plain soap and water. Gently pat the site dry. °· Inspect the site at least twice daily. °· Limit your activity for the first 48 hours. Do not bend, squat, or lift anything over 20 lb (9 kg) or as directed by your health care provider. °· Plan to have someone take you home after the procedure. Follow instructions about when you can drive or return to work. °SEEK MEDICAL CARE IF: °· You get light-headed when standing up. °· You have drainage (other than a small amount of blood on the dressing). °· You have chills. °· You have a fever. °· You have redness, warmth, swelling, or pain at the insertion site. °SEEK IMMEDIATE MEDICAL CARE IF:  °· You develop chest pain or shortness of breath, feel faint, or pass out. °· You have bleeding, swelling larger than a walnut, or drainage from the  catheter insertion site. °· You develop pain, discoloration, coldness, or severe bruising in the leg or arm that held the catheter. °· You develop bleeding from any other place, such as the bowels. You may see bright red blood in your urine or stools, or your stools may appear black and tarry. °· You have heavy bleeding from the site. If this happens, hold pressure on the site. °MAKE SURE YOU: °· Understand these instructions. °· Will watch your condition. °· Will get help right away if you are not doing well or get worse. °Document Released: 11/19/2004 Document Revised: 09/17/2013 Document Reviewed: 09/25/2012 °ExitCare® Patient Information ©2015 [employer]. This information is not intended to replace advice given to you by your health care provider. Make sure you discuss any questions you have with your health care provider. ° ° °

## 2014-01-11 NOTE — Progress Notes (Signed)
**Note De-Identified Dupriest Obfuscation** Pam Turpin,PA notified of heart rate 115 standing and ok to d/c home

## 2014-01-16 ENCOUNTER — Other Ambulatory Visit (HOSPITAL_COMMUNITY): Payer: Self-pay | Admitting: Interventional Radiology

## 2014-01-16 DIAGNOSIS — IMO0002 Reserved for concepts with insufficient information to code with codable children: Secondary | ICD-10-CM

## 2014-01-22 ENCOUNTER — Ambulatory Visit (HOSPITAL_COMMUNITY)
Admission: RE | Admit: 2014-01-22 | Discharge: 2014-01-22 | Disposition: A | Discharge status: Home / Self Care | Payer: BC Managed Care – PPO | Source: Ambulatory Visit | Attending: Interventional Radiology | Admitting: Interventional Radiology

## 2014-01-22 DIAGNOSIS — IMO0002 Reserved for concepts with insufficient information to code with codable children: Secondary | ICD-10-CM

## 2014-01-30 ENCOUNTER — Other Ambulatory Visit (HOSPITAL_COMMUNITY): Payer: Self-pay | Admitting: Interventional Radiology

## 2014-02-03 ENCOUNTER — Emergency Department (HOSPITAL_COMMUNITY): Payer: BC Managed Care – PPO

## 2014-02-03 ENCOUNTER — Encounter (HOSPITAL_COMMUNITY): Payer: Self-pay | Admitting: Emergency Medicine

## 2014-02-03 ENCOUNTER — Emergency Department (HOSPITAL_COMMUNITY)
Admission: EM | Admit: 2014-02-03 | Discharge: 2014-02-03 | Disposition: A | Discharge status: Home / Self Care | Payer: BC Managed Care – PPO | Attending: Emergency Medicine | Admitting: Emergency Medicine

## 2014-02-03 DIAGNOSIS — Z8701 Personal history of pneumonia (recurrent): Secondary | ICD-10-CM | POA: Diagnosis not present

## 2014-02-03 DIAGNOSIS — R110 Nausea: Secondary | ICD-10-CM | POA: Diagnosis not present

## 2014-02-03 DIAGNOSIS — Z7982 Long term (current) use of aspirin: Secondary | ICD-10-CM | POA: Diagnosis not present

## 2014-02-03 DIAGNOSIS — IMO0002 Reserved for concepts with insufficient information to code with codable children: Secondary | ICD-10-CM | POA: Diagnosis not present

## 2014-02-03 DIAGNOSIS — N23 Unspecified renal colic: Secondary | ICD-10-CM | POA: Diagnosis not present

## 2014-02-03 DIAGNOSIS — Z8589 Personal history of malignant neoplasm of other organs and systems: Secondary | ICD-10-CM | POA: Diagnosis not present

## 2014-02-03 DIAGNOSIS — I10 Essential (primary) hypertension: Secondary | ICD-10-CM | POA: Diagnosis not present

## 2014-02-03 DIAGNOSIS — Z79899 Other long term (current) drug therapy: Secondary | ICD-10-CM | POA: Diagnosis not present

## 2014-02-03 DIAGNOSIS — R109 Unspecified abdominal pain: Secondary | ICD-10-CM | POA: Diagnosis present

## 2014-02-03 DIAGNOSIS — E782 Mixed hyperlipidemia: Secondary | ICD-10-CM | POA: Insufficient documentation

## 2014-02-03 DIAGNOSIS — Z8719 Personal history of other diseases of the digestive system: Secondary | ICD-10-CM | POA: Diagnosis not present

## 2014-02-03 DIAGNOSIS — J45909 Unspecified asthma, uncomplicated: Secondary | ICD-10-CM | POA: Diagnosis not present

## 2014-02-03 LAB — URINALYSIS, ROUTINE W REFLEX MICROSCOPIC
BILIRUBIN URINE: NEGATIVE
Glucose, UA: NEGATIVE mg/dL
KETONES UR: NEGATIVE mg/dL
LEUKOCYTES UA: NEGATIVE
NITRITE: NEGATIVE
PH: 8.5 — AB (ref 5.0–8.0)
PROTEIN: NEGATIVE mg/dL
Specific Gravity, Urine: 1.01 (ref 1.005–1.030)
Urobilinogen, UA: 0.2 mg/dL (ref 0.0–1.0)

## 2014-02-03 LAB — URINE MICROSCOPIC-ADD ON

## 2014-02-03 MED ORDER — OXYCODONE-ACETAMINOPHEN 5-325 MG PO TABS
1.0000 | ORAL_TABLET | ORAL | Status: DC | PRN
Start: 2014-02-03 — End: 2015-04-01

## 2014-02-03 MED ORDER — ONDANSETRON 8 MG PO TBDP: 8.0000 mg | ORAL_TABLET | Freq: Three times a day (TID) | ORAL | Status: DC | PRN

## 2014-02-03 MED ORDER — OXYCODONE-ACETAMINOPHEN 5-325 MG PO TABS: 2.0000 | ORAL_TABLET | ORAL | Status: DC | PRN

## 2014-02-03 MED ORDER — TAMSULOSIN HCL 0.4 MG PO CAPS: 0.4000 mg | ORAL_CAPSULE | Freq: Every day | ORAL | Status: DC

## 2014-02-03 NOTE — ED Notes (Signed)
**Note De-Identified Connon Obfuscation** Pt c/o left flank pain that radiates around to his back. Pt also c/o nausea.

## 2014-02-03 NOTE — Discharge Instructions (Signed)
**Note De-identified Dilone Obfuscation** Kidney Stones °Kidney stones (urolithiasis) are deposits that form inside your kidneys. The intense pain is caused by the stone moving through the urinary tract. When the stone moves, the ureter goes into spasm around the stone. The stone is usually passed in the urine.  °CAUSES  °· A disorder that makes certain neck glands produce too much parathyroid hormone (primary hyperparathyroidism). °· A buildup of uric acid crystals, similar to gout in your joints. °· Narrowing (stricture) of the ureter. °· A kidney obstruction present at birth (congenital obstruction). °· Previous surgery on the kidney or ureters. °· Numerous kidney infections. °SYMPTOMS  °· Feeling sick to your stomach (nauseous). °· Throwing up (vomiting). °· Blood in the urine (hematuria). °· Pain that usually spreads (radiates) to the groin. °· Frequency or urgency of urination. °DIAGNOSIS  °· Taking a history and physical exam. °· Blood or urine tests. °· CT scan. °· Occasionally, an examination of the inside of the urinary bladder (cystoscopy) is performed. °TREATMENT  °· Observation. °· Increasing your fluid intake. °· Extracorporeal shock wave lithotripsy--This is a noninvasive procedure that uses shock waves to break up kidney stones. °· Surgery may be needed if you have severe pain or persistent obstruction. There are various surgical procedures. Most of the procedures are performed with the use of small instruments. Only small incisions are needed to accommodate these instruments, so recovery time is minimized. °The size, location, and chemical composition are all important variables that will determine the proper choice of action for you. Talk to your health care provider to better understand your situation so that you will minimize the risk of injury to yourself and your kidney.  °HOME CARE INSTRUCTIONS  °· Drink enough water and fluids to keep your urine clear or pale yellow. This will help you to pass the stone or stone fragments. °· Strain  all urine through the provided strainer. Keep all particulate matter and stones for your health care provider to see. The stone causing the pain may be as small as a grain of salt. It is very important to use the strainer each and every time you pass your urine. The collection of your stone will allow your health care provider to analyze it and verify that a stone has actually passed. The stone analysis will often identify what you can do to reduce the incidence of recurrences. °· Only take over-the-counter or prescription medicines for pain, discomfort, or fever as directed by your health care provider. °· Make a follow-up appointment with your health care provider as directed. °· Get follow-up X-rays if required. The absence of pain does not always mean that the stone has passed. It may have only stopped moving. If the urine remains completely obstructed, it can cause loss of kidney function or even complete destruction of the kidney. It is your responsibility to make sure X-rays and follow-ups are completed. Ultrasounds of the kidney can show blockages and the status of the kidney. Ultrasounds are not associated with any radiation and can be performed easily in a matter of minutes. °SEEK MEDICAL CARE IF: °· You experience pain that is progressive and unresponsive to any pain medicine you have been prescribed. °SEEK IMMEDIATE MEDICAL CARE IF:  °· Pain cannot be controlled with the prescribed medicine. °· You have a fever or shaking chills. °· The severity or intensity of pain increases over 18 hours and is not relieved by pain medicine. °· You develop a new onset of abdominal pain. °· You feel faint or pass out. °·  **Note De-identified Wellons Obfuscation** You are unable to urinate. °MAKE SURE YOU:  °· Understand these instructions. °· Will watch your condition. °· Will get help right away if you are not doing well or get worse. °Document Released: 05/03/2005 Document Revised: 01/03/2013 Document Reviewed: 10/04/2012 °[employer]® Patient Information ©2015  [employer]. This information is not intended to replace advice given to you by your health care provider. Make sure you discuss any questions you have with your health care provider. ° °

## 2014-02-03 NOTE — ED Provider Notes (Signed)
**Note De-Identified Breese Obfuscation** CSN: [id number]     Arrival date & time 02/03/14  1903 History   First MD Initiated Contact with Patient 02/03/14 1927   This chart was scribed for Leota Jacobsen, MD by Rosary Lively, ED scribe. This patient was seen in room APA10/APA10 and the patient's care was started at 7:47 PM.    Chief Complaint  Patient presents with  . Flank Pain   The history is provided by the patient. No language interpreter was used.   HPI Comments:  Derek Donaldson is a 43 y.o. male who presents to the Emergency Department complaining of episodes of severe left flank pain, with associated symptoms of hot spells, chills, and nausea, onset today, at approximately 4:45PM. Pt reports that the pain radiates to the back. Pt reports that the pain comes and goes, and lasts up to 45 minutes. Pt reports that he is unable to find a position to lay or sit that modifies the pain. Pt took advil and muscle relaxer at 5:00PM and now rates pain as a 2 out of 10. Pt has never experienced this pain. Pt denies family h/o kidney stones. Pt reports he is currently going through a great deal regarding recent brain surgeries. Pt denies vomiting and diarrhea.   Past Medical History  Diagnosis Date  . Essential hypertension, benign   . Mixed hyperlipidemia   . Sensorineural hearing loss of low frequency   . Asthma   . Choledocholithiasis s/p ERCP [id number] 03/21/2012  . Chronic cholecystitis with calculus s/p lap chole [id number] 04/03/2012  . Shortness of breath     /w "over exertion"  . Pneumonia 2005    in [facility].- x2 days   . GERD (gastroesophageal reflux disease)     prior to Cholecystectomy  . Cancer 2010    skin ( unsure of the pathology)- removed  . History of stress test 2013    normal results   Past Surgical History  Procedure Laterality Date  . Stapedectomy  04/23/2003    Revision left stapedectomy  . Vasectomy    . Ercp  03/21/2012    Procedure: ENDOSCOPIC RETROGRADE CHOLANGIOPANCREATOGRAPHY (ERCP);  Surgeon: Jeryl Columbia, MD;  Location: [facility];  Service: Endoscopy;  Laterality: N/A;  aqntibiotics and type and screen  . Cholecystectomy  11/13  . Suboccipital craniectomy cervical laminectomy N/A 12/21/2013    Procedure: Aborted Cranioplasty For Chiari Malformation (Could not complete procedure);  Surgeon: Ashok Pall, MD;  Location: [facility];  Service: Neurosurgery;  Laterality: N/A;  Aborted Cranioplasty For Chiari Malformation   Family History  Problem Relation Age of Onset  . Stroke Paternal Grandfather   . Cancer Maternal Grandfather     unknown type  . Asthma Mother   . Asthma Brother   . Heart failure Maternal Grandmother   . Aneurysm Father    History  Substance Use Topics  . Smoking status: Never Smoker   . Smokeless tobacco: Never Used  . Alcohol Use: No    Review of Systems  Gastrointestinal: Positive for nausea.  Genitourinary: Positive for flank pain.  All other systems reviewed and are negative.     Allergies  Contrast media and Shellfish allergy  Home Medications   Prior to Admission medications   Medication Sig Start Date End Date Taking? Authorizing Provider  amLODipine (NORVASC) 10 MG tablet Take 10 mg by mouth daily before breakfast.     Historical Provider, MD  aspirin EC 81 MG tablet Take 81 mg by mouth daily. **Note De-Identified [place]augh Obfuscation** Historical Provider, MD  budesonide-formoterol (SYMBICORT) 160-4.5 MCG/ACT inhaler Inhale 2 puffs into the lungs 2 (two) times daily as needed (for shortness of breath).     Historical Provider, MD  cyclobenzaprine (FLEXERIL) 10 MG tablet Take 1 tablet (10 mg total) by mouth 3 (three) times daily as needed for muscle spasms. 12/22/13   Ashok Pall, MD  EPINEPHrine 0.3 mg/0.3 mL IJ SOAJ injection Inject 0.3 mg into the muscle as needed (emergent allergic reaction).    Historical Provider, MD  fenofibrate micronized (ANTARA) 130 MG capsule Take 130 mg by mouth daily before breakfast.    Historical Provider, MD  nebivolol (BYSTOLIC) 5 MG tablet Take 5  mg by mouth daily before breakfast.     Historical Provider, MD  oxyCODONE-acetaminophen (ROXICET) 5-325 MG per tablet Take 1 tablet by mouth every 6 (six) hours as needed for severe pain. 12/22/13   Ashok Pall, MD  predniSONE (DELTASONE) 50 MG tablet Take 50 mg by mouth See admin instructions. Take a total of three tablets prior to procedure on 01/11/2014.  Take one tablet the evening of 01/10/14, take another tablet on 01/11/14 at 0130, and take another tablet on 01/11/2014 at 0730.    Historical Provider, MD  valsartan-hydrochlorothiazide (DIOVAN-HCT) 320-25 MG per tablet Take 1 tablet by mouth daily before breakfast.     Historical Provider, MD   BP 148/85  Pulse 81  Temp(Src) 98.2 F (36.8 C)  Resp 20  Ht 5' 10.5" (1.791 m)  Wt 286 lb 3 oz (129.814 kg)  BMI 40.47 kg/m2  SpO2 99% Physical Exam  Nursing note and vitals reviewed. Constitutional: He is oriented to person, place, and time. He appears well-developed and well-nourished.  Non-toxic appearance. No distress.  HENT:  Head: Normocephalic and atraumatic.  Eyes: Conjunctivae, EOM and lids are normal. Pupils are equal, round, and reactive to light.  Neck: Normal range of motion. Neck supple. No tracheal deviation present. No mass present.  Cardiovascular: Normal rate, regular rhythm and normal heart sounds.  Exam reveals no gallop.   No murmur heard. Pulmonary/Chest: Effort normal and breath sounds normal. No stridor. No respiratory distress. He has no decreased breath sounds. He has no wheezes. He has no rhonchi. He has no rales.  Abdominal: Soft. Normal appearance and bowel sounds are normal. He exhibits no distension. There is no tenderness. There is no rebound and no CVA tenderness.  Musculoskeletal: Normal range of motion. He exhibits no edema and no tenderness.  Neurological: He is alert and oriented to person, place, and time. He has normal strength. No cranial nerve deficit or sensory deficit. GCS eye subscore is 4. GCS verbal  subscore is 5. GCS motor subscore is 6.  Skin: Skin is warm and dry. No abrasion and no rash noted.  Psychiatric: He has a normal mood and affect. His speech is normal and behavior is normal.    ED Course  Procedures   DIAGNOSTIC STUDIES: Oxygen Saturation is 99% on RA, normal by my interpretation.  COORDINATION OF CARE: 7:51 PM-Discussed treatment plan which includes  (CXR, CBC panel, UA) with pt at bedside and pt agreed to plan.  Labs Review Labs Reviewed  URINALYSIS, ROUTINE W REFLEX MICROSCOPIC - Abnormal; Notable for the following:    APPearance CLOUDY (*)    pH 8.5 (*)    Hgb urine dipstick LARGE (*)    All other components within normal limits  URINE MICROSCOPIC-ADD ON - Abnormal; Notable for the following:    Bacteria, UA FEW (*) **Note De-Identified Sefcik Obfuscation** All other components within normal limits    Imaging Review Ct Abdomen Pelvis Wo Contrast  02/03/2014   CLINICAL DATA:  Right lower quadrant pain. Nausea vomiting. Diarrhea.  EXAM: CT ABDOMEN AND PELVIS WITHOUT CONTRAST  TECHNIQUE: Multidetector CT imaging of the abdomen and pelvis was performed following the standard protocol without IV contrast.  COMPARISON:  None.  FINDINGS: Approximate 2 mm stone lies in the proximal left ureter causing mild left hydronephrosis. Small intrarenal stone lies in the lower pole of the left kidney. The tiny nonobstructing stone lies in the lower pole of the right kidney. No renal masses. No right hydronephrosis. Normal right ureter. Bladder is unremarkable.  Mild atelectasis in the posterior medial left lung base with minor atelectasis in both anterior lung bases. Heart is normal in size.  Diffuse fatty infiltration of the liver. No liver mass or focal lesion.  Gallbladder surgically absent.  No bile duct dilation.  Spleen, pancreas and adrenal glands:  Normal.  No pathologically enlarged lymph nodes.  No ascites.  Normal colon and small bowel.  Normal appendix.  No significant bony abnormality.  IMPRESSION: 1. 2 mm  stone in the proximal left ureter causing mild left hydronephrosis. No other acute findings. 2. Small nonobstructing stones in each kidney. No right hydronephrosis. 3. Hepatic steatosis.  Status post cholecystectomy.   Electronically Signed   By: Lajean Manes M.D.   On: 02/03/2014 20:25     EKG Interpretation None      MDM   Final diagnoses:  None    I personally performed the services described in this documentation, which was scribed in my presence. The recorded information has been reviewed and is accurate.  Patient with evidence of kidney stone on CT scan as read by radiology. Will be given referral to urology on call and pain medication   Leota Jacobsen, MD 02/03/14 2133

## 2014-02-08 ENCOUNTER — Other Ambulatory Visit (HOSPITAL_COMMUNITY): Payer: Self-pay | Admitting: Interventional Radiology

## 2014-02-08 DIAGNOSIS — G935 Compression of brain: Secondary | ICD-10-CM

## 2014-02-08 DIAGNOSIS — H9190 Unspecified hearing loss, unspecified ear: Secondary | ICD-10-CM

## 2014-02-08 DIAGNOSIS — H9319 Tinnitus, unspecified ear: Secondary | ICD-10-CM

## 2014-02-08 DIAGNOSIS — I671 Cerebral aneurysm, nonruptured: Secondary | ICD-10-CM

## 2014-02-12 ENCOUNTER — Other Ambulatory Visit: Payer: Self-pay | Admitting: Radiology

## 2014-02-14 ENCOUNTER — Encounter (HOSPITAL_COMMUNITY)
Admission: RE | Admit: 2014-02-14 | Discharge: 2014-02-14 | Disposition: A | Discharge status: Home / Self Care | Payer: BC Managed Care – PPO | Source: Ambulatory Visit | Attending: Interventional Radiology | Admitting: Interventional Radiology

## 2014-02-14 ENCOUNTER — Encounter (HOSPITAL_COMMUNITY): Payer: Self-pay

## 2014-02-14 DIAGNOSIS — G935 Compression of brain: Secondary | ICD-10-CM | POA: Insufficient documentation

## 2014-02-14 DIAGNOSIS — I671 Cerebral aneurysm, nonruptured: Secondary | ICD-10-CM | POA: Diagnosis not present

## 2014-02-14 DIAGNOSIS — H9319 Tinnitus, unspecified ear: Secondary | ICD-10-CM | POA: Insufficient documentation

## 2014-02-14 DIAGNOSIS — H9190 Unspecified hearing loss, unspecified ear: Secondary | ICD-10-CM | POA: Insufficient documentation

## 2014-02-14 DIAGNOSIS — Z01812 Encounter for preprocedural laboratory examination: Secondary | ICD-10-CM | POA: Insufficient documentation

## 2014-02-14 HISTORY — DX: Anesthesia of skin: R20.0

## 2014-02-14 HISTORY — DX: Personal history of urinary calculi: Z87.442

## 2014-02-14 HISTORY — DX: Personal history of other diseases of the respiratory system: Z87.09

## 2014-02-14 HISTORY — DX: Reserved for concepts with insufficient information to code with codable children: IMO0002

## 2014-02-14 LAB — CBC WITH DIFFERENTIAL/PLATELET
BASOS PCT: 1 % (ref 0–1)
Basophils Absolute: 0.1 10*3/uL (ref 0.0–0.1)
Eosinophils Absolute: 0.3 10*3/uL (ref 0.0–0.7)
Eosinophils Relative: 4 % (ref 0–5)
HEMATOCRIT: 43.6 % (ref 39.0–52.0)
HEMOGLOBIN: 14.6 g/dL (ref 13.0–17.0)
Lymphocytes Relative: 23 % (ref 12–46)
Lymphs Abs: 1.7 10*3/uL (ref 0.7–4.0)
MCH: 29.9 pg (ref 26.0–34.0)
MCHC: 33.5 g/dL (ref 30.0–36.0)
MCV: 89.3 fL (ref 78.0–100.0)
Monocytes Absolute: 0.9 10*3/uL (ref 0.1–1.0)
Monocytes Relative: 12 % (ref 3–12)
NEUTROS ABS: 4.5 10*3/uL (ref 1.7–7.7)
Neutrophils Relative %: 60 % (ref 43–77)
Platelets: 268 10*3/uL (ref 150–400)
RBC: 4.88 MIL/uL (ref 4.22–5.81)
RDW: 12.6 % (ref 11.5–15.5)
WBC: 7.4 10*3/uL (ref 4.0–10.5)

## 2014-02-14 LAB — COMPREHENSIVE METABOLIC PANEL
ALBUMIN: 4.4 g/dL (ref 3.5–5.2)
ALT: 23 U/L (ref 0–53)
AST: 18 U/L (ref 0–37)
Alkaline Phosphatase: 44 U/L (ref 39–117)
Anion gap: 15 (ref 5–15)
BUN: 11 mg/dL (ref 6–23)
CO2: 23 mEq/L (ref 19–32)
Calcium: 10 mg/dL (ref 8.4–10.5)
Chloride: 105 mEq/L (ref 96–112)
Creatinine, Ser: 0.78 mg/dL (ref 0.50–1.35)
GFR calc Af Amer: >90 mL/min (ref >90)
Glucose, Bld: 84 mg/dL (ref 70–99)
Potassium: 3.8 mEq/L (ref 3.7–5.3)
Sodium: 143 mEq/L (ref 137–147)
Total Bilirubin: 0.3 mg/dL (ref 0.3–1.2)
Total Protein: 8.1 g/dL (ref 6.0–8.3)

## 2014-02-14 LAB — APTT: APTT: 23 s — AB (ref 24–37)

## 2014-02-14 LAB — PROTIME-INR
INR: 1 (ref 0.00–1.49)
PROTHROMBIN TIME: 13.2 s (ref 11.6–15.2)

## 2014-02-14 NOTE — Progress Notes (Signed)
**Note De-Identified Juliana Obfuscation** 02/14/14 1227  OBSTRUCTIVE SLEEP APNEA  Have you ever been diagnosed with sleep apnea through a sleep study? No  Do you often feel tired, fatigued, or sleepy during the daytime? 0  Has anyone observed you stop breathing during your sleep? 0  Do you have, or are you being treated for high blood pressure? 1  BMI more than 35 kg/m2? 1  Age over 43 years old? 0  Neck circumference greater than 40 cm/16 inches? 1 (20)  Gender: 1  Obstructive Sleep Apnea Score 4

## 2014-02-14 NOTE — Progress Notes (Addendum)
**Note De-Identified Hesketh Obfuscation** Pt doesn't have a cardiologist  Echo and stress test in epic from 2013  CXR in epic from 12-12-13  Medical Md is Dr.Leonard Nyland  EKG under media tab from 11-2013

## 2014-02-14 NOTE — Pre-Procedure Instructions (Signed)
**Note De-Identified [name]** Derek Donaldson  02/14/2014   Your procedure is scheduled on:  Wed, Oct 7 @ 8:30 AM  Report to [name]  at 6:30 AM.  Call this number if you have problems the morning of surgery: [phone]   Remember:   Do not eat food or drink liquids after midnight.   Take these medicines the morning of surgery with A SIP OF WATER: Amlodipine(Norvasc),Symbicort<Bring Your Inhaler With You>,Bystolic(Nebivolol),Zofran(Ondansetron-if needed),Pain Pill(if needed),and Flomax(Tamsulosin)              No Goody's,BC's,Aleve,Ibuprofen,Fish Oil,or any Herbal Medications   Do not wear jewelry  Do not wear lotions, powders, or colognes. You may wear deodorant.  Men may shave face and neck.  Do not bring valuables to the hospital.  [facility] is not responsible                  for any belongings or valuables.               Contacts, dentures or bridgework may not be worn into surgery.  Leave suitcase in the car. After surgery it may be brought to your room.  For patients admitted to the hospital, discharge time is determined by your                treatment team.               Patients discharged the day of surgery will not be allowed to drive  home.    Special Instructions:  [name] - Preparing for Surgery  Before surgery, you can play an important role.  Because skin is not sterile, your skin needs to be as free of germs as possible.  You can reduce the number of germs on you skin by washing with CHG (chlorahexidine gluconate) soap before surgery.  CHG is an antiseptic cleaner which kills germs and bonds with the skin to continue killing germs even after washing.  Please DO NOT use if you have an allergy to CHG or antibacterial soaps.  If your skin becomes reddened/irritated stop using the CHG and inform your nurse when you arrive at Short Stay.  Do not shave (including legs and underarms) for at least 48 hours prior to the first CHG shower.  You may shave your face.  Please follow these  instructions carefully:   1.  Shower with CHG Soap the night before surgery and the                                morning of Surgery.  2.  If you choose to wash your hair, wash your hair first as usual with your       normal shampoo.  3.  After you shampoo, rinse your hair and body thoroughly to remove the                      Shampoo.  4.  Use CHG as you would any other liquid soap.  You can apply chg directly       to the skin and wash gently with scrungie or a clean washcloth.  5.  Apply the CHG Soap to your body ONLY FROM THE NECK DOWN.        Do not use on open wounds or open sores.  Avoid contact with your eyes,       ears, mouth and genitals (private parts). **Note De-Identified [name]** Wash genitals (private parts)       with your normal soap.  6.  Wash thoroughly, paying special attention to the area where your surgery        will be performed.  7.  Thoroughly rinse your body with warm water from the neck down.  8.  DO NOT shower/wash with your normal soap after using and rinsing off       the CHG Soap.  9.  Pat yourself dry with a clean towel.            10.  Wear clean pajamas.            11.  Place clean sheets on your bed the night of your first shower and do not        sleep with pets.  Day of Surgery  Do not apply any lotions/deoderants the morning of surgery.  Please wear clean clothes to the hospital/surgery center.     Please read over the following fact sheets that you were given: Pain Booklet, Coughing and Deep Breathing and Surgical Site Infection Prevention

## 2014-02-20 ENCOUNTER — Encounter (HOSPITAL_COMMUNITY)
Admission: RE | Disposition: A | Discharge status: Home / Self Care | Payer: Self-pay | Source: Ambulatory Visit | Attending: Interventional Radiology

## 2014-02-20 ENCOUNTER — Encounter (HOSPITAL_COMMUNITY): Payer: Self-pay | Admitting: Anesthesiology

## 2014-02-20 ENCOUNTER — Ambulatory Visit (HOSPITAL_COMMUNITY)
Admission: RE | Admit: 2014-02-20 | Discharge: 2014-02-20 | Disposition: A | Discharge status: Home / Self Care | Payer: BC Managed Care – PPO | Source: Ambulatory Visit | Attending: Interventional Radiology | Admitting: Interventional Radiology

## 2014-02-20 ENCOUNTER — Ambulatory Visit (HOSPITAL_COMMUNITY): Payer: BC Managed Care – PPO | Admitting: Anesthesiology

## 2014-02-20 ENCOUNTER — Inpatient Hospital Stay (HOSPITAL_COMMUNITY)
Admission: RE | Admit: 2014-02-20 | Discharge: 2014-02-21 | DRG: 025 | Disposition: A | Discharge status: Home / Self Care | Payer: BC Managed Care – PPO | Source: Ambulatory Visit | Attending: Interventional Radiology | Admitting: Interventional Radiology

## 2014-02-20 ENCOUNTER — Encounter (HOSPITAL_COMMUNITY): Payer: BC Managed Care – PPO | Admitting: Anesthesiology

## 2014-02-20 ENCOUNTER — Encounter (HOSPITAL_COMMUNITY): Payer: Self-pay

## 2014-02-20 DIAGNOSIS — Z7982 Long term (current) use of aspirin: Secondary | ICD-10-CM | POA: Diagnosis not present

## 2014-02-20 DIAGNOSIS — G935 Compression of brain: Secondary | ICD-10-CM

## 2014-02-20 DIAGNOSIS — Z8249 Family history of ischemic heart disease and other diseases of the circulatory system: Secondary | ICD-10-CM

## 2014-02-20 DIAGNOSIS — Z91013 Allergy to seafood: Secondary | ICD-10-CM

## 2014-02-20 DIAGNOSIS — Z91041 Radiographic dye allergy status: Secondary | ICD-10-CM

## 2014-02-20 DIAGNOSIS — I10 Essential (primary) hypertension: Secondary | ICD-10-CM | POA: Diagnosis present

## 2014-02-20 DIAGNOSIS — Z823 Family history of stroke: Secondary | ICD-10-CM | POA: Diagnosis not present

## 2014-02-20 DIAGNOSIS — I671 Cerebral aneurysm, nonruptured: Principal | ICD-10-CM | POA: Diagnosis present

## 2014-02-20 DIAGNOSIS — K219 Gastro-esophageal reflux disease without esophagitis: Secondary | ICD-10-CM | POA: Diagnosis present

## 2014-02-20 DIAGNOSIS — H9190 Unspecified hearing loss, unspecified ear: Secondary | ICD-10-CM

## 2014-02-20 DIAGNOSIS — H9319 Tinnitus, unspecified ear: Secondary | ICD-10-CM

## 2014-02-20 HISTORY — PX: RADIOLOGY WITH ANESTHESIA: SHX6223

## 2014-02-20 LAB — POCT ACTIVATED CLOTTING TIME
Activated Clotting Time: 157 seconds
Activated Clotting Time: 123 seconds
Activated Clotting Time: 101 seconds
Activated Clotting Time: 107 seconds

## 2014-02-20 LAB — MRSA PCR SCREENING: MRSA BY PCR: NEGATIVE

## 2014-02-20 SURGERY — RADIOLOGY WITH ANESTHESIA
Anesthesia: General

## 2014-02-20 MED ORDER — ASPIRIN EC 81 MG PO TBEC
81.0000 mg | DELAYED_RELEASE_TABLET | Freq: Every day | ORAL | Status: DC
  Administered 2014-02-21: 81 mg via ORAL
  Filled 2014-02-20: qty 1

## 2014-02-20 MED ORDER — DEXAMETHASONE SODIUM PHOSPHATE 10 MG/ML IJ SOLN
INTRAMUSCULAR | Status: DC | PRN
  Administered 2014-02-20: 10 mg via INTRAVENOUS

## 2014-02-20 MED ORDER — CEFAZOLIN SODIUM 1-5 GM-% IV SOLN
1.0000 g | Freq: Once | INTRAVENOUS | Status: AC
  Administered 2014-02-20: 1 g via INTRAVENOUS

## 2014-02-20 MED ORDER — SODIUM CHLORIDE 0.9 % IV SOLN
Freq: Once | INTRAVENOUS | Status: AC
  Administered 2014-02-20 (×3): via INTRAVENOUS

## 2014-02-20 MED ORDER — CEFAZOLIN SODIUM-DEXTROSE 2-3 GM-% IV SOLR
2.0000 g | Freq: Once | INTRAVENOUS | Status: AC
Start: 2014-02-20 — End: 2014-02-20
  Administered 2014-02-20: 2 g via INTRAVENOUS
  Filled 2014-02-20: qty 50

## 2014-02-20 MED ORDER — PHENYLEPHRINE HCL 10 MG/ML IJ SOLN
10.0000 mg | INTRAMUSCULAR | Status: DC | PRN
  Administered 2014-02-20: 20 ug/min via INTRAVENOUS

## 2014-02-20 MED ORDER — ONDANSETRON HCL 4 MG/2ML IJ SOLN: 4.0000 mg | Freq: Four times a day (QID) | INTRAMUSCULAR | Status: DC | PRN

## 2014-02-20 MED ORDER — ONDANSETRON HCL 4 MG/2ML IJ SOLN
INTRAMUSCULAR | Status: DC | PRN
  Administered 2014-02-20: 4 mg via INTRAVENOUS

## 2014-02-20 MED ORDER — NITROGLYCERIN 1 MG/10 ML FOR IR/CATH LAB
INTRA_ARTERIAL | Status: AC
  Filled 2014-02-20: qty 10

## 2014-02-20 MED ORDER — VALSARTAN-HYDROCHLOROTHIAZIDE 320-25 MG PO TABS: 1.0000 | ORAL_TABLET | Freq: Every day | ORAL | Status: DC

## 2014-02-20 MED ORDER — CEFAZOLIN SODIUM 1-5 GM-% IV SOLN
INTRAVENOUS | Status: AC
  Filled 2014-02-20: qty 50

## 2014-02-20 MED ORDER — GLYCOPYRROLATE 0.2 MG/ML IJ SOLN
INTRAMUSCULAR | Status: DC | PRN
  Administered 2014-02-20: 0.6 mg via INTRAVENOUS

## 2014-02-20 MED ORDER — SODIUM CHLORIDE 0.9 % IV SOLN
INTRAVENOUS | Status: DC | PRN
  Administered 2014-02-20: 08:00:00 via INTRAVENOUS

## 2014-02-20 MED ORDER — OXYCODONE HCL 5 MG PO TABS: 5.0000 mg | ORAL_TABLET | Freq: Once | ORAL | Status: DC | PRN

## 2014-02-20 MED ORDER — FENTANYL CITRATE 0.05 MG/ML IJ SOLN
INTRAMUSCULAR | Status: DC | PRN
  Administered 2014-02-20 (×3): 50 ug via INTRAVENOUS

## 2014-02-20 MED ORDER — LABETALOL HCL 5 MG/ML IV SOLN: INTRAVENOUS | Status: DC | PRN

## 2014-02-20 MED ORDER — IRBESARTAN 300 MG PO TABS
300.0000 mg | ORAL_TABLET | Freq: Every day | ORAL | Status: DC
  Administered 2014-02-21: 300 mg via ORAL
  Filled 2014-02-20: qty 1

## 2014-02-20 MED ORDER — PROPOFOL 10 MG/ML IV BOLUS
INTRAVENOUS | Status: DC | PRN
  Administered 2014-02-20: 300 mg via INTRAVENOUS

## 2014-02-20 MED ORDER — HYDROCHLOROTHIAZIDE 25 MG PO TABS
25.0000 mg | ORAL_TABLET | Freq: Every day | ORAL | Status: DC
  Administered 2014-02-21: 25 mg via ORAL
  Filled 2014-02-20: qty 1

## 2014-02-20 MED ORDER — OXYCODONE HCL 5 MG/5ML PO SOLN: 5.0000 mg | Freq: Once | ORAL | Status: DC | PRN

## 2014-02-20 MED ORDER — SODIUM CHLORIDE 0.9 % IV SOLN
INTRAVENOUS | Status: DC
  Administered 2014-02-20: 18:00:00 via INTRAVENOUS

## 2014-02-20 MED ORDER — NEBIVOLOL HCL 5 MG PO TABS
5.0000 mg | ORAL_TABLET | Freq: Every day | ORAL | Status: DC
  Administered 2014-02-21: 5 mg via ORAL
  Filled 2014-02-20 (×2): qty 1

## 2014-02-20 MED ORDER — NITROGLYCERIN 0.2 MG/ML ON CALL CATH LAB
INTRAVENOUS | Status: DC | PRN
  Administered 2014-02-20 (×14): 20 ug via INTRAVENOUS

## 2014-02-20 MED ORDER — ACETAMINOPHEN 500 MG PO TABS: 1000.0000 mg | ORAL_TABLET | Freq: Four times a day (QID) | ORAL | Status: DC | PRN

## 2014-02-20 MED ORDER — HYDROMORPHONE HCL 1 MG/ML IJ SOLN: 0.2500 mg | INTRAMUSCULAR | Status: DC | PRN

## 2014-02-20 MED ORDER — NEOSTIGMINE METHYLSULFATE 10 MG/10ML IV SOLN
INTRAVENOUS | Status: DC | PRN
  Administered 2014-02-20: 5 mg via INTRAVENOUS

## 2014-02-20 MED ORDER — LABETALOL HCL 5 MG/ML IV SOLN
INTRAVENOUS | Status: DC | PRN
  Administered 2014-02-20 (×4): 5 mg via INTRAVENOUS

## 2014-02-20 MED ORDER — LIDOCAINE HCL 1 % IJ SOLN
INTRAMUSCULAR | Status: AC
  Filled 2014-02-20: qty 20

## 2014-02-20 MED ORDER — PROMETHAZINE HCL 25 MG/ML IJ SOLN: 6.2500 mg | INTRAMUSCULAR | Status: DC | PRN

## 2014-02-20 MED ORDER — PHENYLEPHRINE HCL 10 MG/ML IJ SOLN
INTRAMUSCULAR | Status: DC | PRN
  Administered 2014-02-20: 40 ug via INTRAVENOUS
  Administered 2014-02-20: 80 ug via INTRAVENOUS
  Administered 2014-02-20 (×2): 40 ug via INTRAVENOUS

## 2014-02-20 MED ORDER — IOHEXOL 300 MG/ML  SOLN
150.0000 mL | Freq: Once | INTRAMUSCULAR | Status: AC | PRN
  Administered 2014-02-20: 150 mL via INTRA_ARTERIAL

## 2014-02-20 MED ORDER — NICARDIPINE HCL IN NACL 20-0.86 MG/200ML-% IV SOLN
3.0000 mg/h | INTRAVENOUS | Status: DC
  Administered 2014-02-20: 11 mg/h via INTRAVENOUS
  Administered 2014-02-20: 6 mg/h via INTRAVENOUS
  Administered 2014-02-20: 8.5 mg/h via INTRAVENOUS
  Administered 2014-02-20: 11 mg/h via INTRAVENOUS
  Administered 2014-02-21: 2 mg/h via INTRAVENOUS
  Filled 2014-02-20 (×5): qty 200

## 2014-02-20 MED ORDER — ACETAMINOPHEN 650 MG RE SUPP: 650.0000 mg | Freq: Four times a day (QID) | RECTAL | Status: DC | PRN

## 2014-02-20 MED ORDER — NIMODIPINE 30 MG PO CAPS
60.0000 mg | ORAL_CAPSULE | ORAL | Status: AC
  Administered 2014-02-20: 60 mg via ORAL
  Filled 2014-02-20: qty 2

## 2014-02-20 MED ORDER — OXYCODONE-ACETAMINOPHEN 5-325 MG PO TABS: 1.0000 | ORAL_TABLET | ORAL | Status: DC | PRN

## 2014-02-20 MED ORDER — MIDAZOLAM HCL 5 MG/5ML IJ SOLN
INTRAMUSCULAR | Status: DC | PRN
  Administered 2014-02-20 (×2): 1 mg via INTRAVENOUS

## 2014-02-20 MED ORDER — CYCLOBENZAPRINE HCL 10 MG PO TABS
10.0000 mg | ORAL_TABLET | Freq: Three times a day (TID) | ORAL | Status: DC | PRN
  Filled 2014-02-20: qty 1

## 2014-02-20 MED ORDER — VECURONIUM BROMIDE 10 MG IV SOLR
INTRAVENOUS | Status: DC | PRN
  Administered 2014-02-20: 2 mg via INTRAVENOUS
  Administered 2014-02-20: 1 mg via INTRAVENOUS
  Administered 2014-02-20: 3 mg via INTRAVENOUS
  Administered 2014-02-20: 5 mg via INTRAVENOUS
  Administered 2014-02-20: 2 mg via INTRAVENOUS

## 2014-02-20 MED ORDER — LIDOCAINE HCL (CARDIAC) 20 MG/ML IV SOLN
INTRAVENOUS | Status: DC | PRN
  Administered 2014-02-20: 100 mg via INTRAVENOUS

## 2014-02-20 MED ORDER — NICARDIPINE HCL IN NACL 20-0.86 MG/200ML-% IV SOLN
5.0000 mg/h | INTRAVENOUS | Status: DC
  Administered 2014-02-20 (×2): 5 mg/h via INTRAVENOUS

## 2014-02-20 MED ORDER — SUCCINYLCHOLINE CHLORIDE 20 MG/ML IJ SOLN
INTRAMUSCULAR | Status: DC | PRN
  Administered 2014-02-20: 120 mg via INTRAVENOUS

## 2014-02-20 MED ORDER — HEPARIN SODIUM (PORCINE) 1000 UNIT/ML IJ SOLN
INTRAMUSCULAR | Status: DC | PRN
  Administered 2014-02-20: 3000 [IU] via INTRAVENOUS
  Administered 2014-02-20: 1000 [IU] via INTRAVENOUS
  Administered 2014-02-20 (×3): 500 [IU] via INTRAVENOUS

## 2014-02-20 MED ORDER — AMLODIPINE BESYLATE 10 MG PO TABS: 10.0000 mg | ORAL_TABLET | Freq: Every day | ORAL | Status: DC

## 2014-02-20 MED ORDER — BUDESONIDE-FORMOTEROL FUMARATE 160-4.5 MCG/ACT IN AERO
2.0000 | INHALATION_SPRAY | Freq: Two times a day (BID) | RESPIRATORY_TRACT | Status: DC | PRN
  Filled 2014-02-20: qty 6

## 2014-02-20 MED ORDER — TAMSULOSIN HCL 0.4 MG PO CAPS: 0.4000 mg | ORAL_CAPSULE | Freq: Every day | ORAL | Status: DC

## 2014-02-20 NOTE — Anesthesia Preprocedure Evaluation (Addendum)
**Note De-Identified Whitehair Obfuscation** Anesthesia Evaluation  Patient identified by MRN, date of birth, ID band Patient awake    Reviewed: Allergy & Precautions, H&P , NPO status , Patient's Chart, lab work & pertinent test results, reviewed documented beta blocker date and time   History of Anesthesia Complications Negative for: history of anesthetic complications  Airway Mallampati: I      Dental  (+) Teeth Intact   Pulmonary shortness of breath, asthma ,  breath sounds clear to auscultation        Cardiovascular hypertension, Pt. on medications and Pt. on home beta blockers Rhythm:Regular Rate:Normal     Neuro/Psych Chiari malformation    GI/Hepatic GERD-  ,  Endo/Other    Renal/GU      Musculoskeletal   Abdominal   Peds  Hematology negative hematology ROS (+)   Anesthesia Other Findings   Reproductive/Obstetrics                          Anesthesia Physical Anesthesia Plan  ASA: II  Anesthesia Plan: General   Post-op Pain Management:    Induction: Intravenous  Airway Management Planned: Oral ETT  Additional Equipment: Arterial line  Intra-op Plan:   Post-operative Plan: Extubation in OR  Informed Consent: I have reviewed the patients History and Physical, chart, labs and discussed the procedure including the risks, benefits and alternatives for the proposed anesthesia with the patient or authorized representative who has indicated his/her understanding and acceptance.   Dental advisory given  Plan Discussed with: CRNA and Surgeon  Anesthesia Plan Comments:         Anesthesia Quick Evaluation

## 2014-02-20 NOTE — H&P (Signed)
**Note De-Identified [place] Obfuscation** Chief Complaint: "I am here for my procedure with Dr. Estanislado Pandy."  History of Present Illness: Derek Donaldson is a 43 y.o. male with c/o left arm pain and cough workup revealed chiari I malformation surgery was attempted 12/21/13 and aborted secondary to bleeding. 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 The patient c/o of hearing his heartbeat left ear > than right ear. He has been seen in clinic on 01/23/14 with only change since visit of left 35mm kidney stone that he has since passed and denies any current flank pain or hematuria. He denies any chest pain, shortness of breath or palpitations. He denies any active signs of bleeding or excessive bruising. He denies any recent fever or chills. The patient denies any history of sleep apnea or chronic oxygen use. He has previously tolerated sedation without complications and denies any known complications to anesthesia. He states he is allergic to MRI contrast dye with lip swelling and hives and has been premedicated with iodinated contrast as well so was also premedicated today for the procedure.   Past Medical History  Diagnosis Date  . Mixed hyperlipidemia     takes Fenofibrate daily  . GERD (gastroesophageal reflux disease)     prior to Cholecystectomy  . Cancer 2010    skin ( unsure of the pathology)- removed  . Essential hypertension, benign     takes Bystolic,Diovan,and Amlodipine daily  . Asthma     uses Symbicort daily as needed  . Shortness of breath     with exertion  . Pneumonia 2005    hx of  . History of bronchitis   . Chiari malformation   . Numbness     left arm from chiari malformation  . History of kidney stones     Past Surgical History  Procedure Laterality Date  .  Stapedectomy  04/23/2003    Revision left stapedectomy  . Vasectomy    . Ercp  03/21/2012    Procedure: ENDOSCOPIC RETROGRADE CHOLANGIOPANCREATOGRAPHY (ERCP);  Surgeon: Jeryl Columbia, MD;  Location: [facility];  Service: Endoscopy;  Laterality: N/A;  aqntibiotics and type and screen  . Suboccipital craniectomy cervical laminectomy N/A 12/21/2013    Procedure: Aborted Cranioplasty For Chiari Malformation (Could not complete procedure);  Surgeon: Ashok Pall, MD;  Location: [facility];  Service: Neurosurgery;  Laterality: N/A;  Aborted Cranioplasty For Chiari Malformation  . Cholecystectomy  11/13  . Mri      x 2  . Cerebral angiogram  12/2013    Allergies: Contrast media and Shellfish allergy  Medications: Prior to Admission medications   Medication Sig Start Date End Date Taking? Authorizing Provider  amLODipine (NORVASC) 10 MG tablet Take 10 mg by mouth daily before breakfast.    Yes Historical Provider, MD  aspirin EC 81 MG tablet Take 81 mg by mouth daily.   Yes Historical Provider, MD  budesonide-formoterol (SYMBICORT) 160-4.5 MCG/ACT inhaler Inhale 2 puffs into the lungs 2 (two) times daily as needed (for shortness of breath).    Yes Historical Provider, MD  cyclobenzaprine (FLEXERIL) 10 MG tablet Take 1 tablet (10 mg total) by mouth 3 (three) times daily as needed for muscle spasms. 12/22/13  Yes Ashok Pall, MD  EPINEPHrine 0.3 mg/0.3 mL IJ SOAJ injection Inject 0.3 mg into the muscle as **Note De-Identified [place] Obfuscation** needed (emergent allergic reaction).   Yes Historical Provider, MD  fenofibrate micronized (ANTARA) 130 MG capsule Take 130 mg by mouth daily before breakfast.   Yes Historical Provider, MD  ibuprofen (ADVIL,MOTRIN) 200 MG tablet Take 400 mg by mouth every 6 (six) hours as needed for mild pain or moderate pain.   Yes Historical Provider, MD  nebivolol (BYSTOLIC) 5 MG tablet Take 5 mg by mouth daily before breakfast.    Yes Historical Provider, MD  ondansetron (ZOFRAN ODT) 8 MG disintegrating tablet  Take 1 tablet (8 mg total) by mouth every 8 (eight) hours as needed for nausea or vomiting. 02/03/14  Yes Leota Jacobsen, MD  oxyCODONE-acetaminophen (PERCOCET/ROXICET) 5-325 MG per tablet Take 1-2 tablets by mouth every 4 (four) hours as needed for severe pain. 02/03/14  Yes Leota Jacobsen, MD  tamsulosin (FLOMAX) 0.4 MG CAPS capsule Take 1 capsule (0.4 mg total) by mouth daily. 02/03/14  Yes Leota Jacobsen, MD  valsartan-hydrochlorothiazide (DIOVAN-HCT) 320-25 MG per tablet Take 1 tablet by mouth daily before breakfast.    Yes Historical Provider, MD    Family History  Problem Relation Age of Onset  . Stroke Paternal Grandfather   . Cancer Maternal Grandfather     unknown type  . Asthma Mother   . Asthma Brother   . Heart failure Maternal Grandmother   . Aneurysm Father     History   Social History  . Marital Status: Married    Spouse Name: N/A    Number of Children: N/A  . Years of Education: N/A   Social History Main Topics  . Smoking status: Never Smoker   . Smokeless tobacco: Never Used  . Alcohol Use: No  . Drug Use: No  . Sexual Activity: Yes   Other Topics Concern  . None   Social History Narrative  . None   Review of Systems: A 12 point ROS discussed and pertinent positives are indicated in the HPI above.  All other systems are negative.  Review of Systems   Vital Signs: There were no vitals taken for this visit.  Physical Exam  Constitutional: He is oriented to person, place, and time. He appears well-developed and well-nourished. No distress.  HENT:  Head: Normocephalic and atraumatic.  Neck: No tracheal deviation present.  Cardiovascular: Normal rate, regular rhythm and intact distal pulses.  Exam reveals no gallop and no friction rub.   No murmur heard. DP 2+ bilaterally  Pulmonary/Chest: Effort normal and breath sounds normal. No respiratory distress. He has no wheezes. He has no rales.  Abdominal: Soft. Bowel sounds are normal.  Neurological: He  is alert and oriented to person, place, and time.  Grossly intact, equal strength upper and lower extremities, smile symmetrical, tongue midline, speech clear, no ataxia  Skin: Skin is warm and dry. He is not diaphoretic.  Psychiatric: He has a normal mood and affect. His behavior is normal. Thought content normal.    Imaging: Ct Abdomen Pelvis Wo Contrast  02/03/2014   CLINICAL DATA:  Right lower quadrant pain. Nausea vomiting. Diarrhea.  EXAM: CT ABDOMEN AND PELVIS WITHOUT CONTRAST  TECHNIQUE: Multidetector CT imaging of the abdomen and pelvis was performed following the standard protocol without IV contrast.  COMPARISON:  None.  FINDINGS: Approximate 2 mm stone lies in the proximal left ureter causing mild left hydronephrosis. Small intrarenal stone lies in the lower pole of the left kidney. The tiny nonobstructing stone lies in the lower pole of the right kidney. No **Note De-Identified [place]at Obfuscation** renal masses. No right hydronephrosis. Normal right ureter. Bladder is unremarkable.  Mild atelectasis in the posterior medial left lung base with minor atelectasis in both anterior lung bases. Heart is normal in size.  Diffuse fatty infiltration of the liver. No liver mass or focal lesion.  Gallbladder surgically absent.  No bile duct dilation.  Spleen, pancreas and adrenal glands:  Normal.  No pathologically enlarged lymph nodes.  No ascites.  Normal colon and small bowel.  Normal appendix.  No significant bony abnormality.  IMPRESSION: 1. 2 mm stone in the proximal left ureter causing mild left hydronephrosis. No other acute findings. 2. Small nonobstructing stones in each kidney. No right hydronephrosis. 3. Hepatic steatosis.  Status post cholecystectomy.   Electronically Signed   By: Lajean Manes M.D.   On: 02/03/2014 20:25   Ir Radiologist Eval & Mgmt  01/23/2014   EXAM: ESTABLISHED PATIENT OFFICE VISIT  CHIEF COMPLAINT: Persistent bilateral pulsatile tinnitus, left greater than right. History of hearing loss. Previous history of  inner ear surgery.  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  The patient is accompanied by his wife.  Briefly the patient was seen by neurosurgery, Dr. Christella Noa, for left arm pain on forward flexion of the neck and also on coughing. 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  Subsequent workup revealed the presence of the fast flow dural AV fistula as described above.  Patient denies any visual symptoms, denies any nausea, vomiting, gait difficulties, or of autonomic dysfunction of bowel or bladder. Patient denies any difficulty with gait or of shooting paresthesias or pain or numbness in his upper or lower extremities.  Patient denies any episodes of passing out or of seizures.  On close questioning, patient denies any recent history of trauma to the head in the past.  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  Past Medical History:  High blood pressure, high cholesterol.  Previous Surgeries: Cholecystectomy in 2013, ERCP November 2013. Attempted Chiari decompression in August 2015.  Medications: Valsartan/hydrochlorothiazide. Amlodipine. Fenofibrate. Bystolic. Aspirin 81 mg. Symbicort.  Allergies:  Contrast dye.  Review Of Systems: Essentially negative for pathologic symptomatology otherwise.  Social History: Married lives with his wife. Has 3 healthy children. Denies smoking or drinking alcohol.  Denies use of illicit chemicals.  Family History: Significant for asthma. Dad and his uncle with ruptured intracanal  aneurysms. Seizures. Heart problems. High blood pressure. Stroke. Asthma.  PHYSICAL EXAMINATION: Brief neurological examination reveals the patient to be alert, awake, oriented to time, place, space. Affect normal. Speech and comprehension normal. No gross cranial nerve abnormalities apart from the decreased hearing on the left.  Motor and sensory and station and gait grossly within normal limits.  ASSESSMENT AND PLAN: The patient's recent catheter angiogram was reviewed in detail with him and his wife. The abnormal vasculature and branches supplying the dural AV fistula draining primarily the left transverse sinus, sigmoid sinus and also retrograde opacification of the cortical veins especially the vein of [place] was reviewed.  The option of endovascular embolization of the abnormal branches supplying the dural AV fistula was **Note De-Identified [place] Obfuscation** reviewed in detail. The reasons, the risks, the benefits and the alternatives were discussed in detail.  Given the size and the speed of this dural AV fistula, it was felt that the patient will need a stage embolization maybe 2 or even 3 stage embolizations with liquid embolic. Questions were answered to their satisfaction. Reasons as to why this needed to be a staged embolization because of the amount of contrast, and radiation and to allow for hemodynamic equilibrium were reviewed in detail. Both the patient and his wife understand and are in agreement with proceeding. This will be scheduled under anesthesia to be performed within the next few days.   Electronically Signed   By: Luanne Bras M.D.   On: 01/22/2014 15:29    Labs:  CBC:  Recent Labs  12/12/13 1010 12/21/13 1333 01/11/14 0656 02/14/14 1223  WBC 7.9  --  8.4 7.4  HGB 15.4 11.9* 13.2 14.6  HCT 45.0 35.0* 39.0 43.6  PLT 357  --  328 268    COAGS:  Recent Labs  01/11/14 0656 02/14/14 1223  INR 1.06 1.00  APTT 28 23*    BMP:  Recent Labs  11/14/13 0916 12/12/13 1010 12/21/13 1333  01/11/14 0656 02/14/14 1223  NA  --  143 142 138 143  K  --  3.6* 3.5* 3.9 3.8  CL  --  102  --  103 105  CO2  --  25  --  21 23  GLUCOSE  --  87 106* 147* 84  BUN  --  10  --  14 11  CALCIUM  --  9.8  --  9.8 10.0  CREATININE 0.90 0.77  --  0.70 0.78  GFRNONAA  --  >90  --  >90 >90  GFRAA  --  >90  --  >90 >90    LIVER FUNCTION TESTS:  Recent Labs  02/14/14 1223  BILITOT 0.3  AST 18  ALT 23  ALKPHOS 44  PROT 8.1  ALBUMIN 4.4    TUMOR MARKERS: No results found for this basename: AFPTM, CEA, CA199, CHROMGRNA,  in the last 8760 hours  Assessment and Plan: Chiari 1 malformation Left 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 Scheduled today for cerebral arteriogram with embolization with general anesthesia Patient has been NPO, afebrile, labs reviewed Patient will be admitted overnight in neuro ICU with possible D/C in am if stable. Risks and Benefits discussed with the patient. All of the patient's questions were answered, patient is agreeable to proceed. Consent signed and in chart.    SignedHedy Jacob 02/20/2014, 8:02 AM

## 2014-02-20 NOTE — Procedures (Signed)
**Note De-Identified [place] Obfuscation** S/P Lt common carotid and Lt external carotid arteriogram followed by superselective embolization of Lt occipital artery and its abnormal fistulous connectons using liquid onyx 18 and 34 ,and coils

## 2014-02-20 NOTE — Progress Notes (Signed)
**Note De-Identified [place]o Obfuscation** Referring Physician(s): Neuro  Subjective: Patient s/p embolization of Lt occipital artery and its abnormal fistulous connectons using liquid onyx 18 and 34 ,and coils today. He has been extubated and denies any headache, vision changes, N/V. He states his tinnitus has already significantly improved. He denies any right groin site pain.   Allergies: Contrast media and Shellfish allergy  Medications: Prior to Admission medications   Medication Sig Start Date End Date Taking? Authorizing Provider  amLODipine (NORVASC) 10 MG tablet Take 10 mg by mouth daily before breakfast.    Yes Historical Provider, MD  aspirin EC 81 MG tablet Take 81 mg by mouth daily.   Yes Historical Provider, MD  budesonide-formoterol (SYMBICORT) 160-4.5 MCG/ACT inhaler Inhale 2 puffs into the lungs 2 (two) times daily as needed (for shortness of breath).    Yes Historical Provider, MD  cyclobenzaprine (FLEXERIL) 10 MG tablet Take 1 tablet (10 mg total) by mouth 3 (three) times daily as needed for muscle spasms. 12/22/13  Yes Ashok Pall, MD  fenofibrate micronized (ANTARA) 130 MG capsule Take 130 mg by mouth daily before breakfast.   Yes Historical Provider, MD  ibuprofen (ADVIL,MOTRIN) 200 MG tablet Take 400 mg by mouth every 6 (six) hours as needed for mild pain or moderate pain.   Yes Historical Provider, MD  nebivolol (BYSTOLIC) 5 MG tablet Take 5 mg by mouth daily before breakfast.    Yes Historical Provider, MD  ondansetron (ZOFRAN ODT) 8 MG disintegrating tablet Take 1 tablet (8 mg total) by mouth every 8 (eight) hours as needed for nausea or vomiting. 02/03/14  Yes Leota Jacobsen, MD  tamsulosin (FLOMAX) 0.4 MG CAPS capsule Take 1 capsule (0.4 mg total) by mouth daily. 02/03/14  Yes Leota Jacobsen, MD  valsartan-hydrochlorothiazide (DIOVAN-HCT) 320-25 MG per tablet Take 1 tablet by mouth daily before breakfast.    Yes Historical Provider, MD  EPINEPHrine 0.3 mg/0.3 mL IJ SOAJ injection Inject 0.3 mg into the  muscle as needed (emergent allergic reaction).    Historical Provider, MD  oxyCODONE-acetaminophen (PERCOCET/ROXICET) 5-325 MG per tablet Take 1-2 tablets by mouth every 4 (four) hours as needed for severe pain. 02/03/14   Leota Jacobsen, MD    Review of Systems  Vital Signs: BP 147/72  Pulse 91  Temp(Src) 98.1 F (36.7 C) (Oral)  Resp 23  Ht 5\' 11"  (1.803 m)  Wt 293 lb 6.9 oz (133.1 kg)  BMI 40.94 kg/m2  SpO2 91%  Physical Exam General: A&Ox3, NAD, family in room Abd: Soft, NT, ND Ext: RCFA dressing C/D/I, soft, no signs of bleeding/hematoma, DP intact Neuro: Grossly intact, PERRLA, speech clear, equal strength upper and lower extremities, no ataxia  Imaging: No results found.  Labs:  CBC:  Recent Labs  12/12/13 1010 12/21/13 1333 01/11/14 0656 02/14/14 1223  WBC 7.9  --  8.4 7.4  HGB 15.4 11.9* 13.2 14.6  HCT 45.0 35.0* 39.0 43.6  PLT 357  --  328 268    COAGS:  Recent Labs  01/11/14 0656 02/14/14 1223  INR 1.06 1.00  APTT 28 23*    BMP:  Recent Labs  11/14/13 0916 12/12/13 1010 12/21/13 1333 01/11/14 0656 02/14/14 1223  NA  --  143 142 138 143  K  --  3.6* 3.5* 3.9 3.8  CL  --  102  --  103 105  CO2  --  25  --  21 23  GLUCOSE  --  87 106* 147* 84  BUN  -- **Note De-Identified Derek Donaldson Obfuscation** 10  --  14 11  CALCIUM  --  9.8  --  9.8 10.0  CREATININE 0.90 0.77  --  0.70 0.78  GFRNONAA  --  >90  --  >90 >90  GFRAA  --  >90  --  >90 >90    LIVER FUNCTION TESTS:  Recent Labs  02/14/14 1223  BILITOT 0.3  AST 18  ALT 23  ALKPHOS 44  PROT 8.1  ALBUMIN 4.4    Assessment and Plan: Chiari 1 malformation  Left 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 D/c in am if stable  Will check am labs      Signed: Hedy Donaldson 02/20/2014, 3:59 PM

## 2014-02-20 NOTE — Anesthesia Procedure Notes (Signed)
**Note De-Identified [place] Obfuscation** Procedure Name: Intubation Date/Time: 02/20/2014 9:29 AM Performed by: Jenne Campus Pre-anesthesia Checklist: Patient identified, Emergency Drugs available, Suction available, Patient being monitored and Timeout performed Patient Re-evaluated:Patient Re-evaluated prior to inductionOxygen Delivery Method: Circle system utilized Preoxygenation: Pre-oxygenation with 100% oxygen Intubation Type: IV induction Ventilation: Mask ventilation without difficulty, Oral airway inserted - appropriate to patient size and Two handed mask ventilation required Laryngoscope Size: Miller and 2 Grade View: Grade II Tube type: Oral Tube size: 7.5 mm Number of attempts: 1 Airway Equipment and Method: Stylet Placement Confirmation: ETT inserted through vocal cords under direct vision,  positive ETCO2,  CO2 detector and breath sounds checked- equal and bilateral Secured at: 23 cm Tube secured with: Tape Dental Injury: Teeth and Oropharynx as per pre-operative assessment

## 2014-02-20 NOTE — Transfer of Care (Signed)
**Note De-Identified [place] Obfuscation** Immediate Anesthesia Transfer of Care Note  Patient: Derek Donaldson  Procedure(s) Performed: Procedure(s): RADIOLOGY WITH ANESTHESIA EMBOLIZATION (N/A)  Patient Location: PACU  Anesthesia Type:General  Level of Consciousness: awake, oriented and patient cooperative  Airway & Oxygen Therapy: Patient Spontanous Breathing and Patient connected to face mask oxygen  Post-op Assessment: Report given to PACU RN and Post -op Vital signs reviewed and stable  Post vital signs: Reviewed  Complications: No apparent anesthesia complications

## 2014-02-20 NOTE — Progress Notes (Signed)
**Note De-Identified Stopper Obfuscation** Upon arrival to pacu, pts groin site cdi with pressure dressing, pt with cardene gtt infusing at 5 mg/hr

## 2014-02-21 ENCOUNTER — Other Ambulatory Visit: Payer: Self-pay | Admitting: Radiology

## 2014-02-21 ENCOUNTER — Encounter (HOSPITAL_COMMUNITY): Payer: Self-pay | Admitting: Interventional Radiology

## 2014-02-21 DIAGNOSIS — I671 Cerebral aneurysm, nonruptured: Secondary | ICD-10-CM

## 2014-02-21 LAB — CBC WITH DIFFERENTIAL/PLATELET
BASOS PCT: 0 % (ref 0–1)
Basophils Absolute: 0 10*3/uL (ref 0.0–0.1)
Eosinophils Absolute: 0 10*3/uL (ref 0.0–0.7)
Eosinophils Relative: 0 % (ref 0–5)
HCT: 35.8 % — ABNORMAL LOW (ref 39.0–52.0)
HEMOGLOBIN: 11.9 g/dL — AB (ref 13.0–17.0)
LYMPHS ABS: 1.2 10*3/uL (ref 0.7–4.0)
LYMPHS PCT: 8 % — AB (ref 12–46)
MCH: 29.3 pg (ref 26.0–34.0)
MCHC: 33.2 g/dL (ref 30.0–36.0)
MCV: 88.2 fL (ref 78.0–100.0)
MONOS PCT: 7 % (ref 3–12)
Monocytes Absolute: 1.1 10*3/uL — ABNORMAL HIGH (ref 0.1–1.0)
NEUTROS ABS: 13.7 10*3/uL — AB (ref 1.7–7.7)
NEUTROS PCT: 85 % — AB (ref 43–77)
Platelets: 262 10*3/uL (ref 150–400)
RBC: 4.06 MIL/uL — AB (ref 4.22–5.81)
RDW: 12.8 % (ref 11.5–15.5)
WBC: 16 10*3/uL — ABNORMAL HIGH (ref 4.0–10.5)

## 2014-02-21 LAB — BASIC METABOLIC PANEL
ANION GAP: 12 (ref 5–15)
BUN: 10 mg/dL (ref 6–23)
CHLORIDE: 107 meq/L (ref 96–112)
CO2: 22 mEq/L (ref 19–32)
Calcium: 8.6 mg/dL (ref 8.4–10.5)
Creatinine, Ser: 0.67 mg/dL (ref 0.50–1.35)
GFR calc Af Amer: >90 mL/min (ref >90)
GFR calc non Af Amer: >90 mL/min (ref >90)
Glucose, Bld: 134 mg/dL — ABNORMAL HIGH (ref 70–99)
POTASSIUM: 3.7 meq/L (ref 3.7–5.3)
Sodium: 141 mEq/L (ref 137–147)

## 2014-02-21 NOTE — Progress Notes (Signed)
**Note De-identified [place] Obfuscation** UR completed.  Albino Bufford, RN BSN [facility] CCM Trauma/Neuro ICU Case Manager [phone]  

## 2014-02-21 NOTE — Plan of Care (Signed)
**Note De-Identified [place] Obfuscation** Problem: Phase I Progression Outcomes Goal: Voiding-avoid urinary catheter unless indicated Outcome: Completed/Met Date Met:  02/21/14 Foley catheter removed. Patient voided 450 mL.  Problem: Phase II Progression Outcomes Goal: Discharge plan in place and appropriate Outcome: Completed/Met Date Met:  02/21/14 Pt to discharge to home Goal: Tolerates diet Pt ate regular diet for breakfast Goal: Vascular site scale level 0 - I Vascular Site Scale Level 0: No bruising/bleeding/hematoma Level I (Mild): Bruising/Ecchymosis, minimal bleeding/ooozing, palpable hematoma < 3 cm Level II (Moderate): Bleeding not affecting hemodynamic parameters, pseudoaneurysm, palpable hematoma > 3 cm Level III (Severe) Bleeding which affects hemodynamic parameters or retroperitoneal hemorrhage  Outcome: Completed/Met Date Met:  02/21/14 Level 0

## 2014-02-21 NOTE — Discharge Summary (Signed)
**Note De-Identified [place] Obfuscation** Patient ID: Derek Donaldson MRN: [id number] DOB/AGE: 43-28-1972 43 y.o.  Admit date: 02/20/2014 Discharge date: 02/21/2014  Admission Diagnoses: Dural AV fistula  Discharge Diagnoses:  Active Problems:   Cerebrovascular dural AV fistula S/p Embolization of Lt occipital artery and its abnormal fistulous connectons using liquid onyx and coils.  Discharged Condition: Good, stable.  Hospital Course: Derek Donaldson is a 43 y.o. male with c/o left arm pain and cough workup revealed chiari I malformation surgery was attempted 12/21/13 and aborted secondary to bleeding. Patient underwent angiogram 01/11/14 which revealed fast flow prominent dural AV fistula with multiple branches emanating from the left external carotid artery branches, the left thyrocervical trunk branches and the right external carotid artery branches, and possibly the left vertebral artery. The patient c/o of hearing his heartbeat left ear > than right ear. He was seen in consult on 01/23/14 and scheduled for an outpatient embolization of dural fistula.   He presented on 02/20/14 to [facility] as an outpatient and underwent successful Embolization of Lt occipital artery and its abnormal fistulous connectons using liquid onyx and coils with general anesthesia. He was extubated and admitted overnight in Neuro ICU for close observation and BP control. He was able to eat without N/V today, he has ambulated without lightheadedness or dizziness, he denies any vision changes or neurological changes. He states minimal headache left frontal region only with movement and does not progress. He states his previous complaint of hearing his heart beat in his left ear > right ear has improved since procedure and is not as intense today. He had repeat blood work today with an elevated wbc and slight neutrophilia shift, he denies any fever or chills, he is afebrile per RN. He had his foley catheter removed and was able to urinate on his own and denies any dysuria or  urinary hesitancy. He was instructed to call us if he develops any fever, chills or urinary symptoms. He denies any chest pain or shortness of breath today. He has been seen by Dr. Estanislado Pandy and is deemed stable for discharge home today with the below instructions given, he states understanding. He will follow up in 2 weeks and possible stage 2 emobolization procedure in 4 weeks.   Consults: Anesthesia.  Treatments: Embolization of Lt occipital artery and its abnormal fistulous connectons using liquid onyx and coils.  Discharge Exam: Blood pressure 139/72, pulse 82, temperature 97.8 F (36.6 C), temperature source Oral, resp. rate 23, height 5\' 11"  (1.803 m), weight 293 lb 6.9 oz (133.1 kg), SpO2 97.00%.  PE:  General: A&Ox3, NAD, sitting up in chair Heart: RRR without M/G/R Lungs: CTA bilaterally without w/r/r Abd: Soft, NT, ND, (+) BS Ext: RCFA dressing C/D/I, minimal tenderness, soft, no signs of bleeding, infection or hematoma, DP 2+ bilaterally, warm without edema B/L Neuro: Grossly intact, speech clear, equal strength upper and lower extremities, no ataxia, PERRLA, EOMI, fine motor intact, sensation intact  Disposition: 01-Home or Self Care  Discharge Instructions   Call MD for:  difficulty breathing, headache or visual disturbances    Complete by:  As directed      Call MD for:  extreme fatigue    Complete by:  As directed      Call MD for:  hives    Complete by:  As directed      Call MD for:  persistant dizziness or light-headedness    Complete by:  As directed      Call MD for:  persistant **Note De-Identified [place]ant Obfuscation** nausea and vomiting    Complete by:  As directed      Call MD for:  redness, tenderness, or signs of infection (pain, swelling, redness, odor or green/yellow discharge around incision site)    Complete by:  As directed      Call MD for:  severe uncontrolled pain    Complete by:  As directed      Call MD for:  temperature >100.4    Complete by:  As directed      Diet - low sodium heart  healthy    Complete by:  As directed      Driving Restrictions    Complete by:  As directed   No driving x 2 weeks     Increase activity slowly    Complete by:  As directed      Lifting restrictions    Complete by:  As directed   No lifting >15 lbs x 2 weeks     Remove dressing in 24 hours    Complete by:  As directed             Medication List    STOP taking these medications       ondansetron 8 MG disintegrating tablet  Commonly known as:  ZOFRAN ODT      TAKE these medications       amLODipine 10 MG tablet  Commonly known as:  NORVASC  Take 10 mg by mouth daily before breakfast.     ANTARA 130 MG capsule  Generic drug:  fenofibrate micronized  Take 130 mg by mouth daily before breakfast.     aspirin EC 81 MG tablet  Take 81 mg by mouth daily.     budesonide-formoterol 160-4.5 MCG/ACT inhaler  Commonly known as:  SYMBICORT  Inhale 2 puffs into the lungs 2 (two) times daily as needed (for shortness of breath).     cyclobenzaprine 10 MG tablet  Commonly known as:  FLEXERIL  Take 1 tablet (10 mg total) by mouth 3 (three) times daily as needed for muscle spasms.     EPINEPHrine 0.3 mg/0.3 mL Soaj injection  Commonly known as:  EPI-PEN  Inject 0.3 mg into the muscle as needed (emergent allergic reaction).     ibuprofen 200 MG tablet  Commonly known as:  ADVIL,MOTRIN  Take 400 mg by mouth every 6 (six) hours as needed for mild pain or moderate pain.     nebivolol 5 MG tablet  Commonly known as:  BYSTOLIC  Take 5 mg by mouth daily before breakfast.     oxyCODONE-acetaminophen 5-325 MG per tablet  Commonly known as:  PERCOCET/ROXICET  Take 1-2 tablets by mouth every 4 (four) hours as needed for severe pain.     tamsulosin 0.4 MG Caps capsule  Commonly known as:  FLOMAX  Take 1 capsule (0.4 mg total) by mouth daily.     valsartan-hydrochlorothiazide 320-25 MG per tablet  Commonly known as:  DIOVAN-HCT  Take 1 tablet by mouth daily before breakfast.             Follow-up Information   Follow up with Rob Hickman, MD In 2 weeks. (Our office will call with appointment [phone])    Specialty:  Interventional Radiology   Contact information:   [address] [place] [address] [phone]      Signed: Tsosie Billing PA-C Interventional Radiology 02/21/2014, 11:40 AM

## 2014-02-22 ENCOUNTER — Other Ambulatory Visit (HOSPITAL_COMMUNITY): Payer: Self-pay | Admitting: Interventional Radiology

## 2014-02-22 DIAGNOSIS — H9319 Tinnitus, unspecified ear: Secondary | ICD-10-CM

## 2014-02-22 DIAGNOSIS — I671 Cerebral aneurysm, nonruptured: Secondary | ICD-10-CM

## 2014-02-22 DIAGNOSIS — G935 Compression of brain: Secondary | ICD-10-CM

## 2014-02-22 DIAGNOSIS — H9190 Unspecified hearing loss, unspecified ear: Secondary | ICD-10-CM

## 2014-02-22 NOTE — Anesthesia Postprocedure Evaluation (Signed)
**Note De-identified Carlile Obfuscation**  **Note De-Identified Dokes Obfuscation** Anesthesia Post-op Note  Patient: Derek Donaldson  Procedure(s) Performed: Procedure(s): RADIOLOGY WITH ANESTHESIA EMBOLIZATION (N/A)  Patient Location: PACU  Anesthesia Type:General  Level of Consciousness: awake and alert   Airway and Oxygen Therapy: Patient Spontanous Breathing  Post-op Pain: none  Post-op Assessment: Post-op Vital signs reviewed  Post-op Vital Signs: stable  Last Vitals:  Filed Vitals:   02/21/14 1200  BP: 129/68  Pulse: 73  Temp: 36.3 C  Resp: 22    Complications: No apparent anesthesia complications

## 2014-03-06 ENCOUNTER — Ambulatory Visit (HOSPITAL_COMMUNITY)
Admission: RE | Admit: 2014-03-06 | Discharge: 2014-03-06 | Disposition: A | Discharge status: Home / Self Care | Payer: BC Managed Care – PPO | Source: Ambulatory Visit | Attending: Radiology | Admitting: Radiology

## 2014-03-06 DIAGNOSIS — I671 Cerebral aneurysm, nonruptured: Secondary | ICD-10-CM

## 2014-03-28 ENCOUNTER — Telehealth (HOSPITAL_COMMUNITY): Payer: Self-pay | Admitting: Interventional Radiology

## 2014-03-28 NOTE — Telephone Encounter (Signed)
**Note De-Identified [place] Obfuscation** Pt called to let me know that his phone has been messed up but is now fixed. We discussed scheduling his next procedure which is due to be done now. I let him know that I am still awaiting approval from his insurance company and will call him ASAP upon getting the approval to schedule. He is in agreement with this. JM

## 2014-04-04 ENCOUNTER — Other Ambulatory Visit (HOSPITAL_COMMUNITY): Payer: Self-pay | Admitting: Interventional Radiology

## 2014-04-04 DIAGNOSIS — I671 Cerebral aneurysm, nonruptured: Secondary | ICD-10-CM

## 2014-04-12 ENCOUNTER — Other Ambulatory Visit: Payer: Self-pay | Admitting: Radiology

## 2014-04-22 ENCOUNTER — Encounter (HOSPITAL_COMMUNITY): Payer: Self-pay | Admitting: Pharmacy Technician

## 2014-04-23 ENCOUNTER — Encounter (HOSPITAL_COMMUNITY): Payer: Self-pay

## 2014-04-23 ENCOUNTER — Encounter (HOSPITAL_COMMUNITY)
Admission: RE | Admit: 2014-04-23 | Discharge: 2014-04-23 | Disposition: A | Discharge status: Home / Self Care | Payer: BC Managed Care – PPO | Source: Ambulatory Visit | Attending: Interventional Radiology | Admitting: Interventional Radiology

## 2014-04-23 DIAGNOSIS — I10 Essential (primary) hypertension: Secondary | ICD-10-CM | POA: Diagnosis not present

## 2014-04-23 DIAGNOSIS — K219 Gastro-esophageal reflux disease without esophagitis: Secondary | ICD-10-CM | POA: Diagnosis not present

## 2014-04-23 DIAGNOSIS — J45909 Unspecified asthma, uncomplicated: Secondary | ICD-10-CM | POA: Diagnosis not present

## 2014-04-23 DIAGNOSIS — J984 Other disorders of lung: Secondary | ICD-10-CM | POA: Insufficient documentation

## 2014-04-23 DIAGNOSIS — Q0700 Arnold-Chiari syndrome without spina bifida or hydrocephalus: Secondary | ICD-10-CM | POA: Diagnosis not present

## 2014-04-23 DIAGNOSIS — C4490 Unspecified malignant neoplasm of skin, unspecified: Secondary | ICD-10-CM | POA: Insufficient documentation

## 2014-04-23 DIAGNOSIS — Z01818 Encounter for other preprocedural examination: Secondary | ICD-10-CM | POA: Insufficient documentation

## 2014-04-23 DIAGNOSIS — E785 Hyperlipidemia, unspecified: Secondary | ICD-10-CM | POA: Diagnosis not present

## 2014-04-23 DIAGNOSIS — G950 Syringomyelia and syringobulbia: Secondary | ICD-10-CM | POA: Diagnosis not present

## 2014-04-23 DIAGNOSIS — N200 Calculus of kidney: Secondary | ICD-10-CM | POA: Diagnosis not present

## 2014-04-23 DIAGNOSIS — Z888 Allergy status to other drugs, medicaments and biological substances status: Secondary | ICD-10-CM | POA: Diagnosis not present

## 2014-04-23 DIAGNOSIS — R0609 Other forms of dyspnea: Secondary | ICD-10-CM | POA: Insufficient documentation

## 2014-04-23 LAB — CBC WITH DIFFERENTIAL/PLATELET
BASOS PCT: 1 % (ref 0–1)
Basophils Absolute: 0.1 10*3/uL (ref 0.0–0.1)
EOS ABS: 0.4 10*3/uL (ref 0.0–0.7)
EOS PCT: 6 % — AB (ref 0–5)
HCT: 42.7 % (ref 39.0–52.0)
Hemoglobin: 14.5 g/dL (ref 13.0–17.0)
LYMPHS ABS: 1.9 10*3/uL (ref 0.7–4.0)
Lymphocytes Relative: 27 % (ref 12–46)
MCH: 29 pg (ref 26.0–34.0)
MCHC: 34 g/dL (ref 30.0–36.0)
MCV: 85.4 fL (ref 78.0–100.0)
Monocytes Absolute: 0.6 10*3/uL (ref 0.1–1.0)
Monocytes Relative: 9 % (ref 3–12)
Neutro Abs: 4.1 10*3/uL (ref 1.7–7.7)
Neutrophils Relative %: 57 % (ref 43–77)
PLATELETS: 270 10*3/uL (ref 150–400)
RBC: 5 MIL/uL (ref 4.22–5.81)
RDW: 12.9 % (ref 11.5–15.5)
WBC: 7.1 10*3/uL (ref 4.0–10.5)

## 2014-04-23 LAB — COMPREHENSIVE METABOLIC PANEL
ALBUMIN: 4.3 g/dL (ref 3.5–5.2)
ALK PHOS: 46 U/L (ref 39–117)
ALT: 28 U/L (ref 0–53)
AST: 17 U/L (ref 0–37)
Anion gap: 14 (ref 5–15)
BUN: 10 mg/dL (ref 6–23)
CALCIUM: 9.9 mg/dL (ref 8.4–10.5)
CO2: 25 mEq/L (ref 19–32)
Chloride: 104 mEq/L (ref 96–112)
Creatinine, Ser: 0.75 mg/dL (ref 0.50–1.35)
GFR calc non Af Amer: >90 mL/min (ref >90)
GLUCOSE: 95 mg/dL (ref 70–99)
POTASSIUM: 3.8 meq/L (ref 3.7–5.3)
SODIUM: 143 meq/L (ref 137–147)
TOTAL PROTEIN: 7.6 g/dL (ref 6.0–8.3)
Total Bilirubin: 0.3 mg/dL (ref 0.3–1.2)

## 2014-04-23 LAB — PROTIME-INR
INR: 0.99 (ref 0.00–1.49)
Prothrombin Time: 13.2 seconds (ref 11.6–15.2)

## 2014-04-23 LAB — APTT: aPTT: 28 seconds (ref 24–37)

## 2014-04-23 NOTE — Pre-Procedure Instructions (Signed)
**Note De-Identified [place] Obfuscation** Derek Donaldson  04/23/2014   Your procedure is scheduled on:  Monday April 29, 2014 at 8:30 AM.  Report to [facility] Admitting at 6:30 AM.  Call this number if you have problems the morning of surgery: [phone]   Remember:   Do not eat food or drink liquids after midnight.   Take these medicines the morning of surgery with A SIP OF WATER: Amlodipine (Norvasc), Symbicort inhaler, Nebivolol (Bystolic)   Please stop taking Ibuprofen, Advil, Motrin, Alleve etc.    Do not wear jewelry.  Do not wear lotions, powders, or cologne.   Men may shave face and neck.  Do not bring valuables to the hospital.  [facility] is not responsible for any belongings or valuables.               Contacts, dentures or bridgework may not be worn into surgery.  Leave suitcase in the car. After surgery it may be brought to your room.  For patients admitted to the hospital, discharge time is determined by your treatment team.               Patients discharged the day of surgery will not be allowed to drive home.  Name and phone number of your driver:   Special Instructions: Shower using CHG soap the night before and the morning of your surgery   Please read over the following fact sheets that you were given: Pain Booklet, Coughing and Deep Breathing and Surgical Site Infection Prevention

## 2014-04-23 NOTE — Progress Notes (Signed)
**Note De-Identified Bartko Obfuscation** Patient denied having any chest or acute pulmonary issues at this time. Nurse noted that patient has a IV contrast dye allergy and patient stated he was instructed to take a total of three doses of prednisone and one dose of benadryl before scheduled procedure on Monday. 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 Nurse questioned patient about if he had a prescription for Plavix and patient informed Nurse that Dr. Estanislado Pandy was going to order him to take it, but instead he stated that he did not need it and it would be given to him DOS.

## 2014-04-24 ENCOUNTER — Encounter (HOSPITAL_COMMUNITY): Payer: Self-pay | Admitting: Vascular Surgery

## 2014-04-24 ENCOUNTER — Encounter (HOSPITAL_COMMUNITY): Payer: Self-pay | Admitting: Certified Registered Nurse Anesthetist

## 2014-04-24 NOTE — Progress Notes (Signed)
**Note De-Identified [place] Obfuscation** Anesthesia Chart Review:  Patient is a 43 year old male scheduled for cerebral arteriogram with possible embolization AVF on 04/29/14 by Dr. Estanislado Pandy. Patient reports he was given pre-medication instructions for his IV contrast dye allergy.  History includes non-smoker, HLD, GERD, HTN, asthma, exertional dyspnea, PNA '05, nephrolithiasis, skin cancer, Chiari malformation and cervicothoracic syrinx (scheduled for suboccipital decompression, C1 laminectomy and duroplasty on 12/21/13. C1 laminectomy was completed, but there was significant bleeding (venous and arterial) when using the drill to perform the craniectomy and the procedure was aborted). He was referred to IR for cerebral angiogram which showed a prominent fast flow dural AVF at the skull base L > R supplied by the occipital arteries bilaterally,the left ascending cervical branch of the thyrocervical trunk, the left vertebral artery musculoskeletal branch. He underwent first stage embolization of the left occiptal artery and its abnormal fistulous connections on 02/20/14.    He saw cardiologist Dr. Domenic Polite in 2013 for atypical chest pain and had a normal stress echo on 09/28/11 [facility]; scanned under Media tab).  11/19/13 EKG (PCP): NSR, LVH with QRS widening.  12/12/13 CXR: Scarring left base. No edema or consolidation.  Preoperative labs noted.  If no acute changes then I anticipate that he can proceed as planned.    George Hugh [facility] Short Stay Center/Anesthesiology Phone [phone] 04/24/2014 11:25 AM

## 2014-04-26 ENCOUNTER — Other Ambulatory Visit: Payer: Self-pay | Admitting: Radiology

## 2014-04-29 ENCOUNTER — Ambulatory Visit (HOSPITAL_COMMUNITY)
Admission: RE | Admit: 2014-04-29 | Discharge: 2014-04-29 | Disposition: A | Discharge status: Home / Self Care | Payer: BC Managed Care – PPO | Source: Ambulatory Visit | Attending: Interventional Radiology | Admitting: Interventional Radiology

## 2014-04-29 ENCOUNTER — Encounter (HOSPITAL_COMMUNITY)
Admission: RE | Disposition: A | Discharge status: Home / Self Care | Payer: Self-pay | Source: Ambulatory Visit | Attending: Interventional Radiology

## 2014-04-29 ENCOUNTER — Encounter (HOSPITAL_COMMUNITY): Payer: Self-pay | Admitting: Certified Registered Nurse Anesthetist

## 2014-04-29 ENCOUNTER — Encounter (HOSPITAL_COMMUNITY): Payer: Self-pay

## 2014-04-29 DIAGNOSIS — Z538 Procedure and treatment not carried out for other reasons: Secondary | ICD-10-CM | POA: Diagnosis not present

## 2014-04-29 DIAGNOSIS — I770 Arteriovenous fistula, acquired: Secondary | ICD-10-CM | POA: Diagnosis present

## 2014-04-29 HISTORY — PX: RADIOLOGY WITH ANESTHESIA: SHX6223

## 2014-04-29 SURGERY — RADIOLOGY WITH ANESTHESIA
Anesthesia: General

## 2014-04-29 MED ORDER — DIPHENHYDRAMINE HCL 25 MG PO CAPS
ORAL_CAPSULE | ORAL | Status: AC
  Filled 2014-04-29: qty 1

## 2014-04-29 MED ORDER — SODIUM CHLORIDE 0.9 % IV SOLN: Freq: Once | INTRAVENOUS | Status: DC

## 2014-04-29 MED ORDER — NIMODIPINE 30 MG PO CAPS
ORAL_CAPSULE | ORAL | Status: AC
  Filled 2014-04-29: qty 1

## 2014-04-29 MED ORDER — LACTATED RINGERS IV SOLN
INTRAVENOUS | Status: DC
  Administered 2014-04-29: 07:00:00 via INTRAVENOUS

## 2014-04-29 MED ORDER — ASPIRIN EC 325 MG PO TBEC
325.0000 mg | DELAYED_RELEASE_TABLET | ORAL | Status: AC
  Administered 2014-04-29: 325 mg via ORAL
  Filled 2014-04-29 (×3): qty 1

## 2014-04-29 MED ORDER — DIPHENHYDRAMINE HCL 25 MG PO TABS
50.0000 mg | ORAL_TABLET | Freq: Once | ORAL | Status: AC
  Administered 2014-04-29: 50 mg via ORAL

## 2014-04-29 MED ORDER — NIMODIPINE 30 MG PO CAPS
60.0000 mg | ORAL_CAPSULE | ORAL | Status: AC
  Administered 2014-04-29: 60 mg via ORAL
  Filled 2014-04-29 (×2): qty 2

## 2014-04-29 MED ORDER — CLOPIDOGREL BISULFATE 75 MG PO TABS
75.0000 mg | ORAL_TABLET | ORAL | Status: AC
  Administered 2014-04-29: 75 mg via ORAL
  Filled 2014-04-29 (×2): qty 1

## 2014-04-29 MED ORDER — DEXTROSE 5 % IV SOLN
3.0000 g | Freq: Once | INTRAVENOUS | Status: DC
  Filled 2014-04-29: qty 3000

## 2014-04-29 MED ORDER — DIPHENHYDRAMINE HCL 50 MG/ML IJ SOLN
INTRAMUSCULAR | Status: AC
  Filled 2014-04-29: qty 1

## 2014-04-29 NOTE — Progress Notes (Signed)
**Note De-Identified [place] Obfuscation** Patient ID: Derek Donaldson, male   DOB: 1970/07/04, 43 y.o.   MRN: [id number]   Pt scheduled for 2nd stage of treatment for large dural AV fistula today  Unfortunately, Dr Estanislado Pandy has emergent pt from ED  Will need to reschedule asap Pt and family aware and agreeable

## 2014-04-29 NOTE — H&P (Signed)
**Note De-Identified [place] Obfuscation** Chief Complaint: Large fast dural arteriovenous fistula Staged treatment- 1st stage 02/22/2014  Referring Physician(s): Nickey Canedo  History of Present Illness: Derek Donaldson is a 43 y.o. male   Pt with hx Chiari malformation Original surgery with Dr Christella Noa for same was aborted 12/19/13 after bleeding abnormality Sent to Dr Estanislado Pandy for cerebral arteriogram which revealed large fast dural AVF 1st staged treatment was performed 02/22/2014 Embolization with liquid onyx and coils Now scheduled for 2nd staged treatment with Dr Estanislado Pandy in IR today Pt continues to have L sided tinnitus although intermittent now instead of constant No other neurologic sxs  Past Medical History  Diagnosis Date  . Mixed hyperlipidemia     takes Fenofibrate daily  . GERD (gastroesophageal reflux disease)     prior to Cholecystectomy  . Cancer 2010    skin ( unsure of the pathology)- removed  . Essential hypertension, benign     takes Bystolic,Diovan,and Amlodipine daily  . Asthma     uses Symbicort daily as needed  . Shortness of breath     with exertion  . Pneumonia 2005    hx of  . History of bronchitis   . Chiari malformation   . Numbness     left arm from chiari malformation  . History of kidney stones     Past Surgical History  Procedure Laterality Date  . Stapedectomy  04/23/2003    Revision left stapedectomy  . Vasectomy    . Ercp  03/21/2012    Procedure: ENDOSCOPIC RETROGRADE CHOLANGIOPANCREATOGRAPHY (ERCP);  Surgeon: Jeryl Columbia, MD;  Location: [facility];  Service: Endoscopy;  Laterality: N/A;  aqntibiotics and type and screen  . Suboccipital craniectomy cervical laminectomy N/A 12/21/2013    Procedure: Aborted Cranioplasty For Chiari Malformation (Could not complete procedure);  Surgeon: Ashok Pall, MD;  Location: [facility];  Service: Neurosurgery;  Laterality: N/A;  Aborted Cranioplasty For Chiari Malformation  . Cholecystectomy  11/13  . Mri      x 2  . Cerebral  angiogram  12/2013  . Radiology with anesthesia N/A 02/20/2014    Procedure: RADIOLOGY WITH ANESTHESIA EMBOLIZATION;  Surgeon: Rob Hickman, MD;  Location: [place];  Service: Radiology;  Laterality: N/A;    Allergies: Contrast media and Shellfish allergy  Medications: Prior to Admission medications   Medication Sig Start Date End Date Taking? Authorizing Provider  amLODipine (NORVASC) 10 MG tablet Take 10 mg by mouth daily before breakfast.     Historical Provider, MD  aspirin EC 81 MG tablet Take 81 mg by mouth daily.    Historical Provider, MD  budesonide-formoterol (SYMBICORT) 160-4.5 MCG/ACT inhaler Inhale 2 puffs into the lungs 2 (two) times daily as needed (for shortness of breath).     Historical Provider, MD  cyclobenzaprine (FLEXERIL) 10 MG tablet Take 1 tablet (10 mg total) by mouth 3 (three) times daily as needed for muscle spasms. Patient not taking: Reported on 04/22/2014 12/22/13   Ashok Pall, MD  EPINEPHrine 0.3 mg/0.3 mL IJ SOAJ injection Inject 0.3 mg into the muscle as needed (emergent allergic reaction).    Historical Provider, MD  fenofibrate micronized (ANTARA) 130 MG capsule Take 130 mg by mouth daily before breakfast.    Historical Provider, MD  ibuprofen (ADVIL,MOTRIN) 200 MG tablet Take 400 mg by mouth every 6 (six) hours as needed for mild pain or moderate pain.    Historical Provider, MD  nebivolol (BYSTOLIC) 5 MG tablet Take 5 mg by mouth daily before breakfast. **Note De-Identified Afzal Obfuscation** Historical Provider, MD  oxyCODONE-acetaminophen (PERCOCET/ROXICET) 5-325 MG per tablet Take 1-2 tablets by mouth every 4 (four) hours as needed for severe pain. Patient not taking: Reported on 04/22/2014 02/03/14   Leota Jacobsen, MD  tamsulosin (FLOMAX) 0.4 MG CAPS capsule Take 1 capsule (0.4 mg total) by mouth daily. Patient not taking: Reported on 04/22/2014 02/03/14   Leota Jacobsen, MD  valsartan-hydrochlorothiazide (DIOVAN-HCT) 320-25 MG per tablet Take 1 tablet by mouth daily before breakfast.      Historical Provider, MD    Family History  Problem Relation Age of Onset  . Stroke Paternal Grandfather   . Cancer Maternal Grandfather     unknown type  . Asthma Mother   . Asthma Brother   . Heart failure Maternal Grandmother   . Aneurysm Father     History   Social History  . Marital Status: Married    Spouse Name: N/A    Number of Children: N/A  . Years of Education: N/A   Social History Main Topics  . Smoking status: Never Smoker   . Smokeless tobacco: Never Used  . Alcohol Use: No  . Drug Use: No  . Sexual Activity: Yes   Other Topics Concern  . None   Social History Narrative     Review of Systems: A 12 point ROS discussed and pertinent positives are indicated in the HPI above.  All other systems are negative.  Review of Systems  Constitutional: Negative for fever, activity change, appetite change and fatigue.  HENT: Positive for tinnitus. Negative for ear pain and hearing loss.   Respiratory: Negative for cough and shortness of breath.   Cardiovascular: Negative for chest pain.  Gastrointestinal: Negative for abdominal pain and abdominal distention.  Genitourinary: Negative for difficulty urinating.  Neurological: Negative for dizziness, tremors, seizures, syncope, facial asymmetry, speech difficulty, weakness, light-headedness, numbness and headaches.  Psychiatric/Behavioral: Negative for behavioral problems and confusion.    Vital Signs: There were no vitals taken for this visit.  Physical Exam  Constitutional: He is oriented to person, place, and time. He appears well-nourished.  HENT:  Head: Atraumatic.  Eyes: EOM are normal.  Cardiovascular: Normal rate, regular rhythm and normal heart sounds.   No murmur heard. Pulmonary/Chest: Effort normal and breath sounds normal. He has no wheezes.  Abdominal: Soft. Bowel sounds are normal. There is no tenderness.  Musculoskeletal: Normal range of motion.  Neurological: He is alert and oriented to  person, place, and time.  Skin: Skin is warm and dry.  Psychiatric: He has a normal mood and affect. His behavior is normal. Judgment and thought content normal.  Nursing note and vitals reviewed.   Imaging: No results found.  Labs:  CBC:  Recent Labs  01/11/14 0656 02/14/14 1223 02/21/14 0555 04/23/14 0930  WBC 8.4 7.4 16.0* 7.1  HGB 13.2 14.6 11.9* 14.5  HCT 39.0 43.6 35.8* 42.7  PLT 328 268 262 270    COAGS:  Recent Labs  01/11/14 0656 02/14/14 1223 04/23/14 0930  INR 1.06 1.00 0.99  APTT 28 23* 28    BMP:  Recent Labs  01/11/14 0656 02/14/14 1223 02/21/14 0555 04/23/14 0930  NA 138 143 141 143  K 3.9 3.8 3.7 3.8  CL 103 105 107 104  CO2 21 23 22 25   GLUCOSE 147* 84 134* 95  BUN 14 11 10 10   CALCIUM 9.8 10.0 8.6 9.9  CREATININE 0.70 0.78 0.67 0.75  GFRNONAA >90 >90 >90 >90  GFRAA >90 >90 >90 > **Note De-Identified Dimauro Obfuscation** 90    LIVER FUNCTION TESTS:  Recent Labs  02/14/14 1223 04/23/14 0930  BILITOT 0.3 0.3  AST 18 17  ALT 23 28  ALKPHOS 44 46  PROT 8.1 7.6  ALBUMIN 4.4 4.3    TUMOR MARKERS: No results for input(s): AFPTM, CEA, CA199, CHROMGRNA in the last 8760 hours.  Assessment and Plan:  Chiari Malformation hx Surgery 12/2013 not performed secondary bleeding abnormality Cerebral arteriogram revealed large fast dural arteriovenous fistula 1st staged treatment 02/2014 - successful Tinnitus in L ear much better- not resolved 2nd staged treatment of embolization today in IR with Dr Estanislado Pandy Pt and family aware of procedure benefits and risks and agreeable to proceed Consent signed andin chart Pt understands he will be admitted to Neuro ICU after intervention Plan for discharge next day  Thank you for this interesting consult.  I greatly enjoyed meeting Zacary D Milosevic and look forward to participating in their care.     I spent a total of 20 minutes face to face in clinical consultation, greater than 50% of which was counseling/coordinating care for Dural  AVF embolization  Signed: TURPIN,PAMELA A 04/29/2014, 7:55 AM

## 2014-04-29 NOTE — Progress Notes (Signed)
**Note De-Identified [place] Obfuscation** Pt received instructions from Jannifer Franklin, and will be rescheduled at later date.  Iv removed and pt instructed to get dressed.

## 2014-04-29 NOTE — Anesthesia Preprocedure Evaluation (Deleted)
**Note De-Identified Wyly Obfuscation** Anesthesia Evaluation   Patient awake    Reviewed: Allergy & Precautions, H&P , NPO status , Patient's Chart, lab work & pertinent test results  Airway        Dental   Pulmonary asthma ,          Cardiovascular hypertension,     Neuro/Psych    GI/Hepatic GERD-  ,  Endo/Other    Renal/GU Renal disease     Musculoskeletal   Abdominal   Peds  Hematology   Anesthesia Other Findings   Reproductive/Obstetrics                             Anesthesia Physical Anesthesia Plan  ASA: III  Anesthesia Plan: General and MAC   Post-op Pain Management:    Induction: Intravenous  Airway Management Planned: Oral ETT and Mask  Additional Equipment: Arterial line  Intra-op Plan:   Post-operative Plan: Possible Post-op intubation/ventilation  Informed Consent: I have reviewed the patients History and Physical, chart, labs and discussed the procedure including the risks, benefits and alternatives for the proposed anesthesia with the patient or authorized representative who has indicated his/her understanding and acceptance.     Plan Discussed with: CRNA, Anesthesiologist and Surgeon  Anesthesia Plan Comments:         Anesthesia Quick Evaluation

## 2014-04-30 ENCOUNTER — Encounter (HOSPITAL_COMMUNITY): Payer: Self-pay | Admitting: Interventional Radiology

## 2014-05-29 ENCOUNTER — Other Ambulatory Visit: Payer: Self-pay | Admitting: Radiology

## 2014-06-03 ENCOUNTER — Encounter (HOSPITAL_COMMUNITY): Payer: Self-pay

## 2014-06-03 ENCOUNTER — Encounter (HOSPITAL_COMMUNITY)
Admission: RE | Admit: 2014-06-03 | Discharge: 2014-06-03 | Disposition: A | Discharge status: Home / Self Care | Payer: BLUE CROSS/BLUE SHIELD | Source: Ambulatory Visit | Attending: Interventional Radiology | Admitting: Interventional Radiology

## 2014-06-03 DIAGNOSIS — I671 Cerebral aneurysm, nonruptured: Secondary | ICD-10-CM | POA: Diagnosis present

## 2014-06-03 DIAGNOSIS — Z79899 Other long term (current) drug therapy: Secondary | ICD-10-CM | POA: Diagnosis not present

## 2014-06-03 DIAGNOSIS — K219 Gastro-esophageal reflux disease without esophagitis: Secondary | ICD-10-CM | POA: Diagnosis not present

## 2014-06-03 DIAGNOSIS — J45909 Unspecified asthma, uncomplicated: Secondary | ICD-10-CM | POA: Diagnosis not present

## 2014-06-03 DIAGNOSIS — Z7982 Long term (current) use of aspirin: Secondary | ICD-10-CM | POA: Diagnosis not present

## 2014-06-03 DIAGNOSIS — I10 Essential (primary) hypertension: Secondary | ICD-10-CM | POA: Diagnosis not present

## 2014-06-03 DIAGNOSIS — E782 Mixed hyperlipidemia: Secondary | ICD-10-CM | POA: Diagnosis not present

## 2014-06-03 DIAGNOSIS — G935 Compression of brain: Secondary | ICD-10-CM | POA: Diagnosis not present

## 2014-06-03 DIAGNOSIS — Z6841 Body Mass Index (BMI) 40.0 and over, adult: Secondary | ICD-10-CM | POA: Diagnosis not present

## 2014-06-03 DIAGNOSIS — Z79891 Long term (current) use of opiate analgesic: Secondary | ICD-10-CM | POA: Diagnosis not present

## 2014-06-03 HISTORY — DX: Other muscle spasm: M62.838

## 2014-06-03 LAB — COMPREHENSIVE METABOLIC PANEL
ALK PHOS: 48 U/L (ref 39–117)
ALT: 41 U/L (ref 0–53)
ANION GAP: 5 (ref 5–15)
AST: 31 U/L (ref 0–37)
Albumin: 4.3 g/dL (ref 3.5–5.2)
BUN: 8 mg/dL (ref 6–23)
CALCIUM: 9.7 mg/dL (ref 8.4–10.5)
CO2: 29 mmol/L (ref 19–32)
Chloride: 103 mEq/L (ref 96–112)
Creatinine, Ser: 0.86 mg/dL (ref 0.50–1.35)
GFR calc non Af Amer: >90 mL/min (ref >90)
Glucose, Bld: 139 mg/dL — ABNORMAL HIGH (ref 70–99)
Potassium: 3.1 mmol/L — ABNORMAL LOW (ref 3.5–5.1)
SODIUM: 137 mmol/L (ref 135–145)
Total Bilirubin: 0.7 mg/dL (ref 0.3–1.2)
Total Protein: 7.3 g/dL (ref 6.0–8.3)

## 2014-06-03 LAB — CBC WITH DIFFERENTIAL/PLATELET
BASOS ABS: 0.1 10*3/uL (ref 0.0–0.1)
BASOS PCT: 1 % (ref 0–1)
EOS ABS: 0.5 10*3/uL (ref 0.0–0.7)
Eosinophils Relative: 6 % — ABNORMAL HIGH (ref 0–5)
HCT: 44.1 % (ref 39.0–52.0)
Hemoglobin: 15 g/dL (ref 13.0–17.0)
Lymphocytes Relative: 24 % (ref 12–46)
Lymphs Abs: 1.7 10*3/uL (ref 0.7–4.0)
MCH: 29.2 pg (ref 26.0–34.0)
MCHC: 34 g/dL (ref 30.0–36.0)
MCV: 86 fL (ref 78.0–100.0)
MONO ABS: 0.3 10*3/uL (ref 0.1–1.0)
Monocytes Relative: 5 % (ref 3–12)
NEUTROS PCT: 64 % (ref 43–77)
Neutro Abs: 4.7 10*3/uL (ref 1.7–7.7)
PLATELETS: 269 10*3/uL (ref 150–400)
RBC: 5.13 MIL/uL (ref 4.22–5.81)
RDW: 13.4 % (ref 11.5–15.5)
WBC: 7.2 10*3/uL (ref 4.0–10.5)

## 2014-06-03 LAB — APTT: aPTT: 29 seconds (ref 24–37)

## 2014-06-03 LAB — PROTIME-INR
INR: 1.05 (ref 0.00–1.49)
PROTHROMBIN TIME: 13.8 s (ref 11.6–15.2)

## 2014-06-03 NOTE — Progress Notes (Addendum)
**Note De-Identified Spargo Obfuscation** EKG under media tab from 11-19-13  CXR in epic from 12-12-13  Echo/stress test in epic from 2013  Halesite is Dr.Leonard Nyland  Denies ever having a heart cath  Pt doesn't have a cardiologist

## 2014-06-03 NOTE — Progress Notes (Signed)
**Note De-identified Ly Obfuscation**   **Note De-Identified Mccready Obfuscation** 06/03/14 0912  OBSTRUCTIVE SLEEP APNEA  Have you ever been diagnosed with sleep apnea through a sleep study? No  Do you snore loudly (loud enough to be heard through closed doors)?  0  Do you often feel tired, fatigued, or sleepy during the daytime? 0  Has anyone observed you stop breathing during your sleep? 0  Do you have, or are you being treated for high blood pressure? 1  BMI more than 35 kg/m2? 1  Age over 26 years old? 0  Neck circumference greater than 40 cm/16 inches? 1  Gender: 1  Obstructive Sleep Apnea Score 4  Score 4 or greater  Results sent to PCP

## 2014-06-03 NOTE — Pre-Procedure Instructions (Signed)
**Note De-Identified Majer Obfuscation** [facility]  06/03/2014   Your procedure is scheduled on:  Wed, Jan 20 @ 8:30 AM   Report to [facility] Entrance A  at 6:30AM   Call this number if you have problems the morning of surgery: [phone]   Remember:   Do not eat food or drink liquids after midnight.   Take these medicines the morning of surgery with A SIP OF WATER: Albuterol<Bring Your Inhaler With You>,Amlodipine(Norvasc),Metoprolol(Toprol),Pain Pill(if needed),and Tamsulosin(Flomax)              Stop taking your Ibuprofen. No Goody's,BC's,Aleve,Fish Oil,or any Herbal Medications.   Do not wear jewelry  Do not wear lotions, powders, or colognes. You may wear deodorant.  Men may shave face and neck.  Do not bring valuables to the [facility].  [facility] is not responsible                  for any belongings or valuables.               Contacts, dentures or bridgework may not be worn into surgery.  Leave suitcase in the car. After surgery it may be brought to your room.  For patients admitted to the [facility], discharge time is determined by your                treatment team.               Special Instructions:  [facility] - Preparing for Surgery  Before surgery, you can play an important role.  Because skin is not sterile, your skin needs to be as free of germs as possible.  You can reduce the number of germs on you skin by washing with CHG (chlorahexidine gluconate) soap before surgery.  CHG is an antiseptic cleaner which kills germs and bonds with the skin to continue killing germs even after washing.  Please DO NOT use if you have an allergy to CHG or antibacterial soaps.  If your skin becomes reddened/irritated stop using the CHG and inform your nurse when you arrive at Short Stay.  Do not shave (including legs and underarms) for at least 48 hours prior to the first CHG shower.  You may shave your face.  Please follow these instructions carefully:   1.  Shower with CHG Soap the night before surgery and the                                 morning of Surgery.  2.  If you choose to wash your hair, wash your hair first as usual with your       normal shampoo.  3.  After you shampoo, rinse your hair and body thoroughly to remove the                      Shampoo.  4.  Use CHG as you would any other liquid soap.  You can apply chg directly       to the skin and wash gently with scrungie or a clean washcloth.  5.  Apply the CHG Soap to your body ONLY FROM THE NECK DOWN.        Do not use on open wounds or open sores.  Avoid contact with your eyes,       ears, mouth and genitals (private parts).  Wash genitals (private parts)       with your normal **Note De-Identified Garlock Obfuscation** soap.  6.  Wash thoroughly, paying special attention to the area where your surgery        will be performed.  7.  Thoroughly rinse your body with warm water from the neck down.  8.  DO NOT shower/wash with your normal soap after using and rinsing off       the CHG Soap.  9.  Pat yourself dry with a clean towel.            10.  Wear clean pajamas.            11.  Place clean sheets on your bed the night of your first shower and do not        sleep with pets.  Day of Surgery  Do not apply any lotions/deoderants the morning of surgery.  Please wear clean clothes to the [facility]/surgery center.     Please read over the following fact sheets that you were given: Pain Booklet, Coughing and Deep Breathing and Surgical Site Infection Prevention

## 2014-06-04 ENCOUNTER — Other Ambulatory Visit: Payer: Self-pay | Admitting: Radiology

## 2014-06-05 ENCOUNTER — Ambulatory Visit (HOSPITAL_COMMUNITY): Payer: BLUE CROSS/BLUE SHIELD | Admitting: Anesthesiology

## 2014-06-05 ENCOUNTER — Encounter (HOSPITAL_COMMUNITY): Payer: Self-pay | Admitting: *Deleted

## 2014-06-05 ENCOUNTER — Ambulatory Visit (HOSPITAL_COMMUNITY)
Admission: RE | Admit: 2014-06-05 | Discharge: 2014-06-06 | Disposition: A | Discharge status: Home / Self Care | Payer: BLUE CROSS/BLUE SHIELD | Source: Ambulatory Visit | Attending: Interventional Radiology | Admitting: Interventional Radiology

## 2014-06-05 ENCOUNTER — Encounter (HOSPITAL_COMMUNITY)
Admission: RE | Disposition: A | Discharge status: Home / Self Care | Payer: Self-pay | Source: Ambulatory Visit | Attending: Interventional Radiology

## 2014-06-05 ENCOUNTER — Encounter (HOSPITAL_COMMUNITY): Payer: Self-pay

## 2014-06-05 ENCOUNTER — Ambulatory Visit (HOSPITAL_COMMUNITY)
Admission: RE | Admit: 2014-06-05 | Discharge: 2014-06-05 | Disposition: A | Discharge status: Home / Self Care | Payer: BLUE CROSS/BLUE SHIELD | Source: Ambulatory Visit | Attending: Interventional Radiology | Admitting: Interventional Radiology

## 2014-06-05 DIAGNOSIS — K8010 Calculus of gallbladder with chronic cholecystitis without obstruction: Secondary | ICD-10-CM

## 2014-06-05 DIAGNOSIS — E782 Mixed hyperlipidemia: Secondary | ICD-10-CM

## 2014-06-05 DIAGNOSIS — I671 Cerebral aneurysm, nonruptured: Secondary | ICD-10-CM

## 2014-06-05 DIAGNOSIS — K8050 Calculus of bile duct without cholangitis or cholecystitis without obstruction: Secondary | ICD-10-CM

## 2014-06-05 DIAGNOSIS — Z7982 Long term (current) use of aspirin: Secondary | ICD-10-CM | POA: Insufficient documentation

## 2014-06-05 DIAGNOSIS — K219 Gastro-esophageal reflux disease without esophagitis: Secondary | ICD-10-CM | POA: Insufficient documentation

## 2014-06-05 DIAGNOSIS — I10 Essential (primary) hypertension: Secondary | ICD-10-CM

## 2014-06-05 DIAGNOSIS — Z79891 Long term (current) use of opiate analgesic: Secondary | ICD-10-CM | POA: Insufficient documentation

## 2014-06-05 DIAGNOSIS — J45909 Unspecified asthma, uncomplicated: Secondary | ICD-10-CM | POA: Insufficient documentation

## 2014-06-05 DIAGNOSIS — Z79899 Other long term (current) drug therapy: Secondary | ICD-10-CM | POA: Insufficient documentation

## 2014-06-05 DIAGNOSIS — I770 Arteriovenous fistula, acquired: Secondary | ICD-10-CM

## 2014-06-05 DIAGNOSIS — Z6841 Body Mass Index (BMI) 40.0 and over, adult: Secondary | ICD-10-CM | POA: Insufficient documentation

## 2014-06-05 DIAGNOSIS — G935 Compression of brain: Secondary | ICD-10-CM

## 2014-06-05 HISTORY — PX: RADIOLOGY WITH ANESTHESIA: SHX6223

## 2014-06-05 LAB — MRSA PCR SCREENING: MRSA by PCR: NEGATIVE

## 2014-06-05 LAB — POCT ACTIVATED CLOTTING TIME
ACTIVATED CLOTTING TIME: 128 s
Activated Clotting Time: 122 seconds

## 2014-06-05 SURGERY — RADIOLOGY WITH ANESTHESIA
Anesthesia: General

## 2014-06-05 MED ORDER — NIMODIPINE 30 MG PO CAPS
60.0000 mg | ORAL_CAPSULE | ORAL | Status: AC
  Administered 2014-06-05: 60 mg via ORAL
  Filled 2014-06-05: qty 2

## 2014-06-05 MED ORDER — NEOSTIGMINE METHYLSULFATE 10 MG/10ML IV SOLN
INTRAVENOUS | Status: DC | PRN
  Administered 2014-06-05: 5 mg via INTRAVENOUS

## 2014-06-05 MED ORDER — PROPOFOL 10 MG/ML IV BOLUS
INTRAVENOUS | Status: DC | PRN
  Administered 2014-06-05: 200 mg via INTRAVENOUS

## 2014-06-05 MED ORDER — ONDANSETRON HCL 4 MG/2ML IJ SOLN
INTRAMUSCULAR | Status: DC | PRN
  Administered 2014-06-05: 4 mg via INTRAVENOUS

## 2014-06-05 MED ORDER — HEPARIN SODIUM (PORCINE) 1000 UNIT/ML IJ SOLN
INTRAMUSCULAR | Status: DC | PRN
  Administered 2014-06-05: 1000 [IU] via INTRAVENOUS
  Administered 2014-06-05: 3000 [IU] via INTRAVENOUS
  Administered 2014-06-05: 1000 [IU] via INTRAVENOUS

## 2014-06-05 MED ORDER — SODIUM CHLORIDE 0.9 % IV SOLN
INTRAVENOUS | Status: DC | PRN
  Administered 2014-06-05 (×2): via INTRAVENOUS

## 2014-06-05 MED ORDER — NICARDIPINE HCL IN NACL 20-0.86 MG/200ML-% IV SOLN
3.0000 mg/h | INTRAVENOUS | Status: DC
  Administered 2014-06-05: 5 mg/h via INTRAVENOUS
  Filled 2014-06-05: qty 200

## 2014-06-05 MED ORDER — ACETAMINOPHEN 650 MG RE SUPP: 650.0000 mg | Freq: Four times a day (QID) | RECTAL | Status: DC | PRN

## 2014-06-05 MED ORDER — NITROGLYCERIN 0.2 MG/ML ON CALL CATH LAB
INTRAVENOUS | Status: DC | PRN
  Administered 2014-06-05 (×5): 20 ug via INTRAVENOUS
  Administered 2014-06-05: 40 ug via INTRAVENOUS
  Administered 2014-06-05 (×2): 20 ug via INTRAVENOUS

## 2014-06-05 MED ORDER — CETYLPYRIDINIUM CHLORIDE 0.05 % MT LIQD
7.0000 mL | Freq: Two times a day (BID) | OROMUCOSAL | Status: DC
  Administered 2014-06-05 (×2): 7 mL via OROMUCOSAL

## 2014-06-05 MED ORDER — GLYCOPYRROLATE 0.2 MG/ML IJ SOLN
INTRAMUSCULAR | Status: DC | PRN
  Administered 2014-06-05: 0.6 mg via INTRAVENOUS
  Administered 2014-06-05: 0.2 mg via INTRAVENOUS

## 2014-06-05 MED ORDER — SODIUM CHLORIDE 0.9 % IV SOLN: Freq: Once | INTRAVENOUS | Status: DC

## 2014-06-05 MED ORDER — FENTANYL CITRATE 0.05 MG/ML IJ SOLN
INTRAMUSCULAR | Status: DC | PRN
  Administered 2014-06-05 (×3): 50 ug via INTRAVENOUS

## 2014-06-05 MED ORDER — DEXTROSE 5 % IV SOLN
3.0000 g | Freq: Once | INTRAVENOUS | Status: AC
  Administered 2014-06-05: 3 g via INTRAVENOUS
  Filled 2014-06-05: qty 3000

## 2014-06-05 MED ORDER — SODIUM CHLORIDE 0.9 % IV SOLN
INTRAVENOUS | Status: DC
  Administered 2014-06-05: 20:00:00 via INTRAVENOUS

## 2014-06-05 MED ORDER — ACETAMINOPHEN 500 MG PO TABS: 1000.0000 mg | ORAL_TABLET | Freq: Four times a day (QID) | ORAL | Status: DC | PRN

## 2014-06-05 MED ORDER — ONDANSETRON HCL 4 MG/2ML IJ SOLN: 4.0000 mg | Freq: Four times a day (QID) | INTRAMUSCULAR | Status: DC | PRN

## 2014-06-05 MED ORDER — LIDOCAINE HCL (CARDIAC) 20 MG/ML IV SOLN
INTRAVENOUS | Status: DC | PRN
  Administered 2014-06-05: 80 mg via INTRAVENOUS

## 2014-06-05 MED ORDER — SODIUM CHLORIDE 0.9 % IV SOLN
INTRAVENOUS | Status: DC | PRN
  Administered 2014-06-05: 08:00:00 via INTRAVENOUS

## 2014-06-05 MED ORDER — ROCURONIUM BROMIDE 100 MG/10ML IV SOLN
INTRAVENOUS | Status: DC | PRN
  Administered 2014-06-05: 10 mg via INTRAVENOUS

## 2014-06-05 MED ORDER — PHENYLEPHRINE HCL 10 MG/ML IJ SOLN
INTRAMUSCULAR | Status: DC | PRN
  Administered 2014-06-05 (×10): 40 ug via INTRAVENOUS

## 2014-06-05 MED ORDER — SUCCINYLCHOLINE CHLORIDE 20 MG/ML IJ SOLN
INTRAMUSCULAR | Status: DC | PRN
  Administered 2014-06-05: 120 mg via INTRAVENOUS

## 2014-06-05 MED ORDER — VECURONIUM BROMIDE 10 MG IV SOLR
INTRAVENOUS | Status: DC | PRN
  Administered 2014-06-05: 3 mg via INTRAVENOUS
  Administered 2014-06-05: 4 mg via INTRAVENOUS
  Administered 2014-06-05: 3 mg via INTRAVENOUS
  Administered 2014-06-05: 2 mg via INTRAVENOUS

## 2014-06-05 MED ORDER — NITROGLYCERIN 1 MG/10 ML FOR IR/CATH LAB
INTRA_ARTERIAL | Status: AC
  Administered 2014-06-05: 25 ug
  Filled 2014-06-05: qty 10

## 2014-06-05 MED ORDER — IOHEXOL 300 MG/ML  SOLN
150.0000 mL | Freq: Once | INTRAMUSCULAR | Status: AC | PRN
  Administered 2014-06-05: 175 mL via INTRA_ARTERIAL

## 2014-06-05 MED ORDER — NICARDIPINE HCL IN NACL 20-0.86 MG/200ML-% IV SOLN
5.0000 mg/h | INTRAVENOUS | Status: DC
  Administered 2014-06-05: 5 mg/h via INTRAVENOUS
  Administered 2014-06-05: 6 mg/h via INTRAVENOUS
  Administered 2014-06-06: 4 mg/h via INTRAVENOUS
  Administered 2014-06-06: 5 mg/h via INTRAVENOUS
  Filled 2014-06-05 (×5): qty 200

## 2014-06-05 MED ORDER — PHENYLEPHRINE HCL 10 MG/ML IJ SOLN
10.0000 mg | INTRAVENOUS | Status: DC | PRN
  Administered 2014-06-05: 25 ug/min via INTRAVENOUS

## 2014-06-05 MED ORDER — NIMODIPINE 30 MG PO CAPS
ORAL_CAPSULE | ORAL | Status: AC
  Administered 2014-06-05: 60 mg via ORAL
  Filled 2014-06-05: qty 2

## 2014-06-05 MED ORDER — LIDOCAINE HCL 1 % IJ SOLN
INTRAMUSCULAR | Status: AC
  Filled 2014-06-05: qty 20

## 2014-06-05 NOTE — Transfer of Care (Signed)
**Note De-Identified Trimpe Obfuscation** Immediate Anesthesia Transfer of Care Note  Patient: Derek [facility]onal[facility]  Procedure(s) Performed: Procedure(s): Embolization (N/A)  Patient Location: PACU  Anesthesia Type:General  Level of Consciousness: awake, oriented and patient cooperative  Airway & Oxygen Therapy: Patient Spontanous Breathing and Patient connected to nasal cannula oxygen  Post-op Assessment: Report given to PACU RN and Post -op Vital signs reviewed and stable  Post vital signs: Reviewed  Complications: No apparent anesthesia complications

## 2014-06-05 NOTE — Addendum Note (Signed)
**Note De-Identified Plotts Obfuscation** Addendum  created 06/05/14 1633 by Jenne Campus, CRNA   Modules edited: Charges VN

## 2014-06-05 NOTE — Anesthesia Postprocedure Evaluation (Signed)
**Note De-identified Sine Obfuscation**  **Note De-Identified Khader Obfuscation** Anesthesia Post-op Note  Patient: Derek [facility]  Procedure(s) Performed: Procedure(s): Embolization (N/A)  Patient Location: PACU  Anesthesia Type:General  Level of Consciousness: awake and alert   Airway and Oxygen Therapy: Patient Spontanous Breathing  Post-op Pain: none  Post-op Assessment: Post-op Vital signs reviewed  Post-op Vital Signs: stable  Last Vitals:  Filed Vitals:   06/05/14 1252  BP:   Pulse: 101  Temp: 37.3 C  Resp: 23    Complications: No apparent anesthesia complications

## 2014-06-05 NOTE — Progress Notes (Signed)
**Note De-Identified Armstrong Obfuscation** [facility] Progress Note Patient Name: Derek Donaldson DOB: [birth year] MRN: [id number]   Date of Service  06/05/2014  HPI/Events of Note    eICU Interventions  SCDs ordered     Intervention Category Intermediate Interventions: Best-practice therapies (e.g. DVT, beta blocker, etc.)  Merton Border 06/05/2014, 9:57 PM

## 2014-06-05 NOTE — H&P (Signed)
**Note De-Identified Serpa Obfuscation** Chief Complaint: Large Dural Arteriovenous fistula  Referring Physician(s): Deveshwar,Sanjeev K  History of Present Illness: Derek [facility]onaldson is a 44 y.o. 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 Mixed hyperlipidemia     takes Fenofibrate daily  . Cancer 2010    skin ( unsure of the pathology)- removed  . Shortness of breath     with exertion  . Pneumonia 2005    hx of  . History of bronchitis   . Chiari malformation   . Numbness     left arm from chiari malformation  . History of kidney stones   . Essential hypertension, benign     takes Metoprolol,Diovan,and Amlodipine daily  . Muscle spasm     takes Flexeril daily as needed  . Asthma     Albuterol inhaler prn;just a small touch    Past Surgical History  Procedure Laterality Date  . Stapedectomy  04/23/2003    Revision left stapedectomy  . Vasectomy    . Ercp  03/21/2012    Procedure: ENDOSCOPIC RETROGRADE CHOLANGIOPANCREATOGRAPHY (ERCP);  Surgeon: Jeryl Columbia, MD;  Location: [facility];  Service: Endoscopy;  Laterality: N/A;  aqntibiotics and type and screen  . Suboccipital craniectomy cervical laminectomy N/A 12/21/2013    Procedure: Aborted Cranioplasty For Chiari Malformation (Could not complete procedure);  Surgeon: Ashok Pall, MD;  Location: [facility];  Service: Neurosurgery;  Laterality: N/A;  Aborted Cranioplasty For Chiari Malformation  .  Cholecystectomy  11/13  . Mri      x 2  . Cerebral angiogram  12/2013  . Radiology with anesthesia N/A 02/20/2014    Procedure: RADIOLOGY WITH ANESTHESIA EMBOLIZATION;  Surgeon: Rob Hickman, MD;  Location: [facility];  Service: Radiology;  Laterality: N/A;  . Radiology with anesthesia N/A 04/29/2014    Procedure: STAGE TWO EMBOLIZATION;  Surgeon: Luanne Bras, MD;  Location: [facility];  Service: Radiology;  Laterality: N/A;    Allergies: Contrast media and Shellfish allergy  Medications: Prior to Admission medications   Medication Sig Start Date End Date Taking? Authorizing Provider  albuterol (PROVENTIL HFA;VENTOLIN HFA) 108 (90 BASE) MCG/ACT inhaler Inhale 2 puffs into the lungs every 4 (four) hours as needed for wheezing or shortness of breath.    Historical Provider, MD  amLODipine (NORVASC) 10 MG tablet Take 10 mg by mouth daily before breakfast.     Historical Provider, MD  aspirin EC 81 MG tablet Take 81 mg by mouth daily.    Historical Provider, MD  cyclobenzaprine (FLEXERIL) 10 MG tablet Take 1 tablet (10 mg total) by mouth 3 (three) times daily as needed for muscle spasms. Patient not taking: Reported on 04/22/2014 12/22/13   Ashok Pall, MD  EPINEPHrine 0.3 mg/0.3 mL IJ SOAJ injection Inject 0.3 mg into the muscle as needed (emergent allergic reaction).    Historical Provider, MD  fenofibrate micronized (ANTARA) 130 MG capsule Take 130 mg by mouth daily before breakfast. **Note De-Identified Winfield Obfuscation** Historical Provider, MD  ibuprofen (ADVIL,MOTRIN) 200 MG tablet Take 400 mg by mouth every 6 (six) hours as needed for mild pain or moderate pain.    Historical Provider, MD  metoprolol succinate (TOPROL-XL) 25 MG 24 hr tablet Take 25 mg by mouth daily.    Historical Provider, MD  oxyCODONE-acetaminophen (PERCOCET/ROXICET) 5-325 MG per tablet Take 1-2 tablets by mouth every 4 (four) hours as needed for severe pain. Patient not taking: Reported on 04/22/2014 02/03/14   Leota Jacobsen, MD  tamsulosin (FLOMAX)  0.4 MG CAPS capsule Take 1 capsule (0.4 mg total) by mouth daily. Patient not taking: Reported on 04/22/2014 02/03/14   Leota Jacobsen, MD  valsartan-hydrochlorothiazide (DIOVAN-HCT) 320-25 MG per tablet Take 1 tablet by mouth daily before breakfast.     Historical Provider, MD    Family History  Problem Relation Age of Onset  . Stroke Paternal Grandfather   . Cancer Maternal Grandfather     unknown type  . Asthma Mother   . Asthma Brother   . Heart failure Maternal Grandmother   . Aneurysm Father     History   Social History  . Marital Status: Married    Spouse Name: N/A    Number of Children: N/A  . Years of Education: N/A   Social History Main Topics  . Smoking status: Never Smoker   . Smokeless tobacco: Never Used  . Alcohol Use: No  . Drug Use: No  . Sexual Activity: Yes   Other Topics Concern  . None   Social History Narrative     Review of Systems: A 12 point ROS discussed and pertinent positives are indicated in the HPI above.  All other systems are negative.  Review of Systems  Constitutional: Negative for fever, activity change, appetite change and unexpected weight change.  HENT: Positive for tinnitus. Negative for trouble swallowing and voice change.   Respiratory: Negative for cough and shortness of breath.   Cardiovascular: Negative for chest pain.  Gastrointestinal: Negative for nausea, vomiting and abdominal pain.  Genitourinary: Negative for difficulty urinating.  Musculoskeletal: Negative for back pain.  Neurological: Negative for dizziness, tremors, seizures, syncope, facial asymmetry, speech difficulty, weakness, light-headedness, numbness and headaches.  Psychiatric/Behavioral: Negative for behavioral problems and confusion.    Vital Signs: There were no vitals taken for this visit.  Physical Exam  Constitutional: He is oriented to person, place, and time. He appears well-nourished.  Eyes: EOM are normal.  Cardiovascular: Normal rate,  regular rhythm and normal heart sounds.   No murmur heard. Pulmonary/Chest: Effort normal and breath sounds normal. He has no wheezes.  Abdominal: Soft. Bowel sounds are normal. There is no tenderness.  Musculoskeletal: Normal range of motion.  Neurological: He is alert and oriented to person, place, and time.  Skin: Skin is warm and dry.  Psychiatric: He has a normal mood and affect. His behavior is normal. Judgment and thought content normal.  Nursing note and vitals reviewed.   Imaging: No results found.  Labs:  CBC:  Recent Labs  02/14/14 1223 02/21/14 0555 04/23/14 0930 06/03/14 0917  WBC 7.4 16.0* 7.1 7.2  HGB 14.6 11.9* 14.5 15.0  HCT 43.6 35.8* 42.7 44.1  PLT 268 262 270 269    COAGS:  Recent Labs  01/11/14 0656 02/14/14 1223 04/23/14 0930 06/03/14 0917  INR 1.06 1.00 0.99 1.05  APTT 28 23* 28 29    BMP:  Recent Labs  02/14/14 1223 02/21/14 0555 04/23/14 0930 06/03/14 0917  NA **Note De-Identified Ignasiak Obfuscation** 143 141 143 137  K 3.8 3.7 3.8 3.1*  CL 105 107 104 103  CO2 23 22 25 29   GLUCOSE 84 134* 95 139*  BUN 11 10 10 8   CALCIUM 10.0 8.6 9.9 9.7  CREATININE 0.78 0.67 0.75 0.86  GFRNONAA >90 >90 >90 >90  GFRAA >90 >90 >90 >90    LIVER FUNCTION TESTS:  Recent Labs  02/14/14 1223 04/23/14 0930 06/03/14 0917  BILITOT 0.3 0.3 0.7  AST 18 17 31   ALT 23 28 41  ALKPHOS 44 46 48  PROT 8.1 7.6 7.3  ALBUMIN 4.4 4.3 4.3    TUMOR MARKERS: No results for input(s): AFPTM, CEA, CA199, CHROMGRNA in the last 8760 hours.  Assessment and Plan:  Hx chiari malformation Known large arteriovenous fistula 1st staged treatment 02/2014 (embolization with Onyx/coils) Doing well Now 2nd stage today in IR Scheduled for cerebral arteriogram with embolization L dural AV fistula Pt aware of procedure benefits and risks and agreeable to proceed Consent signed andin chart Pt understands he will be admitted overnight to Neuro ICU if intervention is performed  Thank you for this  interesting consult.  I greatly enjoyed meeting Derek [facility] and look forward to participating in their care.    I spent a total of 20 minutes face to face in clinical consultation, greater than 50% of which was counseling/coordinating care for cerebral arteriogram with embolization AV fistula  Signed: Tameyah Koch A 06/05/2014, 7:51 AM

## 2014-06-05 NOTE — Progress Notes (Signed)
**Note De-Identified Lage Obfuscation** Referring Physician(s): Dr Estanislado Pandy  Subjective:  Dural arteriovenous fistula embolization with Onyx Stage 2 of treatment Doing well Resting No complaints   Allergies: Contrast media and Shellfish allergy  Medications: Prior to Admission medications   Medication Sig Start Date End Date Taking? Authorizing Provider  amLODipine (NORVASC) 10 MG tablet Take 10 mg by mouth daily before breakfast.    Yes Historical Provider, MD  aspirin EC 81 MG tablet Take 81 mg by mouth daily.   Yes Historical Provider, MD  EPINEPHrine 0.3 mg/0.3 mL IJ SOAJ injection Inject 0.3 mg into the muscle as needed (emergent allergic reaction).   Yes Historical Provider, MD  fenofibrate micronized (ANTARA) 130 MG capsule Take 130 mg by mouth daily before breakfast.   Yes Historical Provider, MD  ibuprofen (ADVIL,MOTRIN) 200 MG tablet Take 400 mg by mouth every 6 (six) hours as needed for mild pain or moderate pain.   Yes Historical Provider, MD  metoprolol succinate (TOPROL-XL) 25 MG 24 hr tablet Take 25 mg by mouth daily.   Yes Historical Provider, MD  valsartan-hydrochlorothiazide (DIOVAN-HCT) 320-25 MG per tablet Take 1 tablet by mouth daily before breakfast.    Yes Historical Provider, MD  albuterol (PROVENTIL HFA;VENTOLIN HFA) 108 (90 BASE) MCG/ACT inhaler Inhale 2 puffs into the lungs every 4 (four) hours as needed for wheezing or shortness of breath.    Historical Provider, MD  cyclobenzaprine (FLEXERIL) 10 MG tablet Take 1 tablet (10 mg total) by mouth 3 (three) times daily as needed for muscle spasms. Patient not taking: Reported on 04/22/2014 12/22/13   Ashok Pall, MD  oxyCODONE-acetaminophen (PERCOCET/ROXICET) 5-325 MG per tablet Take 1-2 tablets by mouth every 4 (four) hours as needed for severe pain. Patient not taking: Reported on 04/22/2014 02/03/14   Leota Jacobsen, MD  tamsulosin (FLOMAX) 0.4 MG CAPS capsule Take 1 capsule (0.4 mg total) by mouth daily. Patient not taking: Reported on 04/22/2014  02/03/14   Leota Jacobsen, MD    Review of Systems  Vital Signs: BP 119/66 mmHg  Pulse 93  Temp(Src) 99.2 F (37.3 C) (Oral)  Resp 23  Ht 5\' 10"  (1.778 m)  Wt 131.6 kg (290 lb 2 oz)  BMI 41.63 kg/m2  SpO2 94%  Physical Exam  Constitutional: He is oriented to person, place, and time.  Abdominal:  Rt groin NT; no bleeding No hematoma Clean and dry  Rt foot 2+ pulses  Musculoskeletal: Normal range of motion.  Good movement B Good sensation B Good strength B    Neurological: He is alert and oriented to person, place, and time.  Face symmetrical Smile = Puffs cheeks = Tongue midline  Skin: Skin is warm and dry.    Imaging: No results found.  Labs:  CBC:  Recent Labs  02/14/14 1223 02/21/14 0555 04/23/14 0930 06/03/14 0917  WBC 7.4 16.0* 7.1 7.2  HGB 14.6 11.9* 14.5 15.0  HCT 43.6 35.8* 42.7 44.1  PLT 268 262 270 269    COAGS:  Recent Labs  01/11/14 0656 02/14/14 1223 04/23/14 0930 06/03/14 0917  INR 1.06 1.00 0.99 1.05  APTT 28 23* 28 29    BMP:  Recent Labs  02/14/14 1223 02/21/14 0555 04/23/14 0930 06/03/14 0917  NA 143 141 143 137  K 3.8 3.7 3.8 3.1*  CL 105 107 104 103  CO2 23 22 25 29   GLUCOSE 84 134* 95 139*  BUN 11 10 10 8   CALCIUM 10.0 8.6 9.9 9.7  CREATININE 0.78 0.67 0.75 **Note De-Identified Clinard Obfuscation** 0.86  GFRNONAA >90 >90 >90 >90  GFRAA >90 >90 >90 >90    LIVER FUNCTION TESTS:  Recent Labs  02/14/14 1223 04/23/14 0930 06/03/14 0917  BILITOT 0.3 0.3 0.7  AST 18 17 31   ALT 23 28 41  ALKPHOS 44 46 48  PROT 8.1 7.6 7.3  ALBUMIN 4.4 4.3 4.3    Assessment and Plan:  Large dural AV fistula Embolization with Onyx today in IR with Dr Anderson Malta Stage 1 02/2014; stage 2 06/05/2014 Doing well Will report to Dr Estanislado Pandy Plan for overnight sta- DC in am   I spent a total of 15inutes face to face in clinical consultation/evaluation, greater than 50% of which was counseling/coordinating care for dural AV fistula  embolization  Signed: Clare Fennimore A 06/05/2014, 2:28 PM

## 2014-06-05 NOTE — Procedures (Signed)
**Note De-Identified Ramser Obfuscation** S/P Lt common carotid arteriogram,,Lt Vert arteriogram,and Lt ascending cervical  Branch,followed by complete embolization of large post auricular branch, and left middle meningeal branches of a large dural AVF,using liquid embolic ONYX 18 and ONYX 34

## 2014-06-05 NOTE — Anesthesia Procedure Notes (Signed)
**Note De-Identified Dolinsky Obfuscation** Procedure Name: Intubation Date/Time: 06/05/2014 8:59 AM Performed by: Jenne Campus Pre-anesthesia Checklist: Patient identified, Emergency Drugs available, Suction available, Patient being monitored and Timeout performed Patient Re-evaluated:Patient Re-evaluated prior to inductionOxygen Delivery Method: Circle system utilized Preoxygenation: Pre-oxygenation with 100% oxygen Intubation Type: IV induction Ventilation: Mask ventilation without difficulty, Oral airway inserted - appropriate to patient size and Two handed mask ventilation required Laryngoscope Size: Miller and 2 Grade View: Grade II Tube type: Oral Tube size: 7.5 mm Number of attempts: 1 Airway Equipment and Method: Stylet Placement Confirmation: ETT inserted through vocal cords under direct vision,  positive ETCO2,  CO2 detector and breath sounds checked- equal and bilateral Secured at: 23 cm Tube secured with: Tape Dental Injury: Teeth and Oropharynx as per pre-operative assessment  Comments: Shoulder roll utilized

## 2014-06-05 NOTE — Anesthesia Preprocedure Evaluation (Addendum)
**Note De-Identified Swann Obfuscation** Anesthesia Evaluation  Patient identified by MRN, date of birth, ID band Patient awake    Reviewed: Allergy & Precautions, NPO status , Patient's Chart, lab work & pertinent test results, reviewed documented beta blocker date and time   History of Anesthesia Complications Negative for: history of anesthetic complications  Airway Mallampati: IV  TM Distance: >3 FB Neck ROM: Full    Dental  (+) Teeth Intact, Dental Advisory Given   Pulmonary shortness of breath, asthma ,  breath sounds clear to auscultation        Cardiovascular hypertension, Pt. on medications and Pt. on home beta blockers Rhythm:Regular Rate:Normal     Neuro/Psych Chiari     GI/Hepatic Neg liver ROS, GERD-  Controlled,  Endo/Other  Morbid obesity  Renal/GU negative Renal ROS     Musculoskeletal   Abdominal   Peds  Hematology   Anesthesia Other Findings   Reproductive/Obstetrics negative OB ROS                           Anesthesia Physical Anesthesia Plan  ASA: III  Anesthesia Plan: General   Post-op Pain Management:    Induction: Intravenous  Airway Management Planned: Oral ETT and Video Laryngoscope Planned  Additional Equipment:   Intra-op Plan:   Post-operative Plan: Possible Post-op intubation/ventilation  Informed Consent: I have reviewed the patients History and Physical, chart, labs and discussed the procedure including the risks, benefits and alternatives for the proposed anesthesia with the patient or authorized representative who has indicated his/her understanding and acceptance.     Plan Discussed with: CRNA and Surgeon  Anesthesia Plan Comments:        Anesthesia Quick Evaluation

## 2014-06-06 ENCOUNTER — Encounter (HOSPITAL_COMMUNITY): Payer: Self-pay | Admitting: Interventional Radiology

## 2014-06-06 DIAGNOSIS — I671 Cerebral aneurysm, nonruptured: Secondary | ICD-10-CM | POA: Diagnosis not present

## 2014-06-06 LAB — BASIC METABOLIC PANEL
Anion gap: 7 (ref 5–15)
BUN: 8 mg/dL (ref 6–23)
CHLORIDE: 106 meq/L (ref 96–112)
CO2: 24 mmol/L (ref 19–32)
Calcium: 8.4 mg/dL (ref 8.4–10.5)
Creatinine, Ser: 0.7 mg/dL (ref 0.50–1.35)
GFR calc Af Amer: >90 mL/min (ref >90)
GFR calc non Af Amer: >90 mL/min (ref >90)
Glucose, Bld: 127 mg/dL — ABNORMAL HIGH (ref 70–99)
Potassium: 3.3 mmol/L — ABNORMAL LOW (ref 3.5–5.1)
SODIUM: 137 mmol/L (ref 135–145)

## 2014-06-06 LAB — CBC WITH DIFFERENTIAL/PLATELET
Basophils Absolute: 0 10*3/uL (ref 0.0–0.1)
Basophils Relative: 0 % (ref 0–1)
EOS ABS: 0 10*3/uL (ref 0.0–0.7)
Eosinophils Relative: 0 % (ref 0–5)
HEMATOCRIT: 38.1 % — AB (ref 39.0–52.0)
Hemoglobin: 12.6 g/dL — ABNORMAL LOW (ref 13.0–17.0)
Lymphocytes Relative: 12 % (ref 12–46)
Lymphs Abs: 1.5 10*3/uL (ref 0.7–4.0)
MCH: 29.2 pg (ref 26.0–34.0)
MCHC: 33.1 g/dL (ref 30.0–36.0)
MCV: 88.4 fL (ref 78.0–100.0)
Monocytes Absolute: 1.1 10*3/uL — ABNORMAL HIGH (ref 0.1–1.0)
Monocytes Relative: 8 % (ref 3–12)
Neutro Abs: 10.2 10*3/uL — ABNORMAL HIGH (ref 1.7–7.7)
Neutrophils Relative %: 80 % — ABNORMAL HIGH (ref 43–77)
Platelets: 228 10*3/uL (ref 150–400)
RBC: 4.31 MIL/uL (ref 4.22–5.81)
RDW: 13.8 % (ref 11.5–15.5)
WBC: 12.8 10*3/uL — AB (ref 4.0–10.5)

## 2014-06-06 MED ORDER — VALSARTAN-HYDROCHLOROTHIAZIDE 320-25 MG PO TABS: 1.0000 | ORAL_TABLET | Freq: Every day | ORAL | Status: DC

## 2014-06-06 MED ORDER — METOPROLOL SUCCINATE ER 25 MG PO TB24
25.0000 mg | ORAL_TABLET | Freq: Every day | ORAL | Status: DC
  Administered 2014-06-06: 25 mg via ORAL
  Filled 2014-06-06: qty 1

## 2014-06-06 MED ORDER — HYDROCHLOROTHIAZIDE 25 MG PO TABS
25.0000 mg | ORAL_TABLET | Freq: Every day | ORAL | Status: DC
  Administered 2014-06-06: 25 mg via ORAL
  Filled 2014-06-06: qty 1

## 2014-06-06 MED ORDER — IRBESARTAN 300 MG PO TABS
300.0000 mg | ORAL_TABLET | Freq: Every day | ORAL | Status: DC
  Administered 2014-06-06: 300 mg via ORAL
  Filled 2014-06-06: qty 1

## 2014-06-06 MED ORDER — AMLODIPINE BESYLATE 10 MG PO TABS
10.0000 mg | ORAL_TABLET | Freq: Every day | ORAL | Status: DC
  Administered 2014-06-06: 10 mg via ORAL
  Filled 2014-06-06 (×2): qty 1

## 2014-06-06 MED ORDER — FENOFIBRATE 160 MG PO TABS
160.0000 mg | ORAL_TABLET | Freq: Every day | ORAL | Status: DC
  Administered 2014-06-06: 160 mg via ORAL
  Filled 2014-06-06: qty 1

## 2014-06-06 NOTE — Progress Notes (Signed)
**Note De-identified Carmickle Obfuscation** UR completed.  Breland Trouten, RN BSN [facility] CCM Trauma/Neuro ICU Case Manager [phone]  

## 2014-06-06 NOTE — Discharge Summary (Signed)
**Note De-Identified Honse Obfuscation** Patient ID: Derek Donaldson MRN: [id number] DOB/AGE: 44-Jun-1972 44 y.o.  Admit date: 06/05/2014 Discharge date: 06/06/2014  Admission Diagnoses: Dural arteriovenous fistula  Discharge Diagnoses: 2nd staged treatment of Dural AVF  Active Problems: Dural Arteriovenous fistula; HTN; HLD; GERD; Chiari Malformation  Discharged Condition: stable; improved  Hospital Course: Pt dx with Chiari Malformation and was set for surgery 12/2013 when developed excessive bleeding and surgery was aborted.   Referred to Dr Estanislado Pandy for evaluation. Cerebral arteriogram revealed dural arteriovenous fistula. Discussion of intervention with pt and wife. 1st staged treatment 02/2014 with Onyx and coils was performed successfully. 2nd staged treatment performed 06/05/2014 with onyx successfully. No complication Pt was admitted to Neuro ICU overnight and stay was uneventful. Slept well; eating well; no N/V. Has ambulated without incident. Denies headache; pain; tingling; numbness. Dr Estanislado Pandy has seen and evaluated pt. Awaiting pt to urinate on own. +passing gas. Resume all home meds.   Consults: None  Significant Diagnostic Studies: Cerebral arteriogram  Treatments: Dural AVF embolization  Discharge Exam: Blood pressure 149/81, pulse 83, temperature 98.2 F (36.8 C), temperature source Oral, resp. rate 19, height 5\' 10"  (1.778 m), weight 131.6 kg (290 lb 2 oz), SpO2 94 %.  PE: EOM; appropriate A/O; VSS Smile = Face symmetrical Tongue midline Heart: RRR Lungs: CTA Abd: soft; +BS Extr: FROM Ambulating well Rt groin: NT; No bleeding; no hematoma Rt foot: 2+ pulses UOP good---need to urinate on own   Results for orders placed or performed during the hospital encounter of 06/05/14  MRSA PCR Screening  Result Value Ref Range   MRSA by PCR NEGATIVE NEGATIVE  Basic metabolic panel  Result Value Ref Range   Sodium 137 135 - 145 mmol/L   Potassium 3.3 (L) 3.5 - 5.1 mmol/L   Chloride 106 96 -  112 mEq/L   CO2 24 19 - 32 mmol/L   Glucose, Bld 127 (H) 70 - 99 mg/dL   BUN 8 6 - 23 mg/dL   Creatinine, Ser 0.70 0.50 - 1.35 mg/dL   Calcium 8.4 8.4 - 10.5 mg/dL   GFR calc non Af Amer >90 >90 mL/min   GFR calc Af Amer >90 >90 mL/min   Anion gap 7 5 - 15  CBC WITH DIFFERENTIAL  Result Value Ref Range   WBC 12.8 (H) 4.0 - 10.5 K/uL   RBC 4.31 4.22 - 5.81 MIL/uL   Hemoglobin 12.6 (L) 13.0 - 17.0 g/dL   HCT 38.1 (L) 39.0 - 52.0 %   MCV 88.4 78.0 - 100.0 fL   MCH 29.2 26.0 - 34.0 pg   MCHC 33.1 30.0 - 36.0 g/dL   RDW 13.8 11.5 - 15.5 %   Platelets 228 150 - 400 K/uL   Neutrophils Relative % 80 (H) 43 - 77 %   Neutro Abs 10.2 (H) 1.7 - 7.7 K/uL   Lymphocytes Relative 12 12 - 46 %   Lymphs Abs 1.5 0.7 - 4.0 K/uL   Monocytes Relative 8 3 - 12 %   Monocytes Absolute 1.1 (H) 0.1 - 1.0 K/uL   Eosinophils Relative 0 0 - 5 %   Eosinophils Absolute 0.0 0.0 - 0.7 K/uL   Basophils Relative 0 0 - 1 %   Basophils Absolute 0.0 0.0 - 0.1 K/uL    Disposition: Dural ArterioVenous Fistula embolization 2nd of 2 treatments 06/05/2014 Pt has done well overnight Dr Estanislado Pandy has seen and examined pt Plan for discharge now Resume all home meds See Dr Dorena Bodo in 2 **Note De-Identified Blaze Obfuscation** weeks Pt has good understanding of dc instructions   Discharge Instructions    Call MD for:  difficulty breathing, headache or visual disturbances    Complete by:  As directed      Call MD for:  extreme fatigue    Complete by:  As directed      Call MD for:  hives    Complete by:  As directed      Call MD for:  persistant dizziness or light-headedness    Complete by:  As directed      Call MD for:  persistant nausea and vomiting    Complete by:  As directed      Call MD for:  redness, tenderness, or signs of infection (pain, swelling, redness, odor or green/yellow discharge around incision site)    Complete by:  As directed      Call MD for:  severe uncontrolled pain    Complete by:  As directed      Call MD for:   temperature >100.4    Complete by:  As directed      Diet - low sodium heart healthy    Complete by:  As directed      Discharge instructions    Complete by:  As directed   2 week follow up with Dr Gennaro Africa will call pt with time and date; call [phone] if concerns; resume all home meds     Discharge wound care:    Complete by:  As directed   May shower today; keep clean band aid on Rt groin site daily x 7 days     Driving Restrictions    Complete by:  As directed   No driving 2 weeks     Increase activity slowly    Complete by:  As directed      Lifting restrictions    Complete by:  As directed   No lifting over 10 lbs 2 weeks            Medication List    TAKE these medications        albuterol 108 (90 BASE) MCG/ACT inhaler  Commonly known as:  PROVENTIL HFA;VENTOLIN HFA  Inhale 2 puffs into the lungs every 4 (four) hours as needed for wheezing or shortness of breath.     amLODipine 10 MG tablet  Commonly known as:  NORVASC  Take 10 mg by mouth daily before breakfast.     ANTARA 130 MG capsule  Generic drug:  fenofibrate micronized  Take 130 mg by mouth daily before breakfast.     aspirin EC 81 MG tablet  Take 81 mg by mouth daily.     cyclobenzaprine 10 MG tablet  Commonly known as:  FLEXERIL  Take 1 tablet (10 mg total) by mouth 3 (three) times daily as needed for muscle spasms.     EPINEPHrine 0.3 mg/0.3 mL Soaj injection  Commonly known as:  EPI-PEN  Inject 0.3 mg into the muscle as needed (emergent allergic reaction).     ibuprofen 200 MG tablet  Commonly known as:  ADVIL,MOTRIN  Take 400 mg by mouth every 6 (six) hours as needed for mild pain or moderate pain.     metoprolol succinate 25 MG 24 hr tablet  Commonly known as:  TOPROL-XL  Take 25 mg by mouth daily.     oxyCODONE-acetaminophen 5-325 MG per tablet  Commonly known as:  PERCOCET/ROXICET  Take 1-2 tablets by mouth every 4 (four) hours as needed for severe **Note De-Identified Medeiros Obfuscation** pain.     tamsulosin  0.4 MG Caps capsule  Commonly known as:  FLOMAX  Take 1 capsule (0.4 mg total) by mouth daily.     valsartan-hydrochlorothiazide 320-25 MG per tablet  Commonly known as:  DIOVAN-HCT  Take 1 tablet by mouth daily before breakfast.         I have spent greater than 30 minutes) coordinating discharge for Skyeler D Margolis.    Signed:  Lavonia Drafts [facility] 06/06/2014, 11:14 AM

## 2014-06-06 NOTE — Progress Notes (Signed)
**Note De-Identified Yip Obfuscation** Discharge and medication instructions given and explained to patient and family.  No questions or concerns voiced by patient or family.  Patient discharged home.

## 2014-06-19 ENCOUNTER — Ambulatory Visit (HOSPITAL_COMMUNITY)
Admission: RE | Admit: 2014-06-19 | Discharge: 2014-06-19 | Disposition: A | Discharge status: Home / Self Care | Payer: BLUE CROSS/BLUE SHIELD | Source: Ambulatory Visit | Attending: Radiology | Admitting: Radiology

## 2014-06-19 DIAGNOSIS — E782 Mixed hyperlipidemia: Secondary | ICD-10-CM

## 2014-06-19 DIAGNOSIS — I10 Essential (primary) hypertension: Secondary | ICD-10-CM

## 2014-06-19 DIAGNOSIS — K8050 Calculus of bile duct without cholangitis or cholecystitis without obstruction: Secondary | ICD-10-CM

## 2014-06-19 DIAGNOSIS — I671 Cerebral aneurysm, nonruptured: Secondary | ICD-10-CM

## 2014-06-19 DIAGNOSIS — I770 Arteriovenous fistula, acquired: Secondary | ICD-10-CM

## 2014-06-19 DIAGNOSIS — G935 Compression of brain: Secondary | ICD-10-CM

## 2014-06-19 DIAGNOSIS — K8010 Calculus of gallbladder with chronic cholecystitis without obstruction: Secondary | ICD-10-CM

## 2014-06-27 ENCOUNTER — Other Ambulatory Visit (HOSPITAL_COMMUNITY): Payer: Self-pay | Admitting: Interventional Radiology

## 2014-06-27 ENCOUNTER — Encounter (HOSPITAL_COMMUNITY): Payer: Self-pay | Admitting: Pharmacy Technician

## 2014-06-27 DIAGNOSIS — Q2730 Arteriovenous malformation, site unspecified: Secondary | ICD-10-CM

## 2014-06-27 DIAGNOSIS — I770 Arteriovenous fistula, acquired: Secondary | ICD-10-CM

## 2014-06-28 ENCOUNTER — Other Ambulatory Visit: Payer: Self-pay | Admitting: Radiology

## 2014-06-29 NOTE — Pre-Procedure Instructions (Signed)
**Note De-Identified Nanda Obfuscation** Derek Donaldson  06/29/2014   Your procedure is scheduled on:  February 17  Report to [facility] Admitting at 06:30 AM.  Call this number if you have problems the morning of surgery: [phone]   Remember:   Do not eat food or drink liquids after midnight.   Take these medicines the morning of surgery with A SIP OF WATER: Albuterol (if needed), Amlodipine, Hydrocodone (if needed), Metoprolol,   Stop Ibuprofen today   STOP/ Do not take Aleve, Naproxen, Advil, Ibuprofen, Motrin, Vitamins, Herbs, or Supplements starting today   Do not wear jewelry, make-up or nail polish.  Do not wear lotions, powders, or perfumes. You may wear deodorant.  Do not shave 48 hours prior to surgery. Men may shave face and neck.  Do not bring valuables to the hospital.  [facility] is not responsible for any belongings or valuables.               Contacts, dentures or bridgework may not be worn into surgery.  Leave suitcase in the car. After surgery it may be brought to your room.  For patients admitted to the hospital, discharge time is determined by your treatment team.               Special Instructions: [facility] - Preparing for Surgery  Before surgery, you can play an important role.  Because skin is not sterile, your skin needs to be as free of germs as possible.  You can reduce the number of germs on you skin by washing with CHG (chlorahexidine gluconate) soap before surgery.  CHG is an antiseptic cleaner which kills germs and bonds with the skin to continue killing germs even after washing.  Please DO NOT use if you have an allergy to CHG or antibacterial soaps.  If your skin becomes reddened/irritated stop using the CHG and inform your nurse when you arrive at Short Stay.  Do not shave (including legs and underarms) for at least 48 hours prior to the first CHG shower.  You may shave your face.  Please follow these instructions carefully:   1.  Shower with CHG Soap the night before  surgery and the morning of Surgery.  2.  If you choose to wash your hair, wash your hair first as usual with your normal shampoo.  3.  After you shampoo, rinse your hair and body thoroughly to remove the shampoo.  4.  Use CHG as you would any other liquid soap.  You can apply CHG directly to the skin and wash gently with scrungie or a clean washcloth.  5.  Apply the CHG Soap to your body ONLY FROM THE NECK DOWN.  Do not use on open wounds or open sores.  Avoid contact with your eyes, ears, mouth and genitals (private parts).  Wash genitals (private parts) with your normal soap.  6.  Wash thoroughly, paying special attention to the area where your surgery will be performed.  7.  Thoroughly rinse your body with warm water from the neck down.  8.  DO NOT shower/wash with your normal soap after using and rinsing off the CHG Soap.  9.  Pat yourself dry with a clean towel.            10.  Wear clean pajamas.            11.  Place clean sheets on your bed the night of your first shower and do not sleep with pets.  Day **Note De-Identified Nicklin Obfuscation** of Surgery  Do not apply any lotions the morning of surgery.  Please wear clean clothes to the hospital/surgery center.     Please read over the following fact sheets that you were given: Pain Booklet, Coughing and Deep Breathing and Surgical Site Infection Prevention

## 2014-07-01 ENCOUNTER — Encounter (HOSPITAL_COMMUNITY): Payer: Self-pay

## 2014-07-01 ENCOUNTER — Encounter (HOSPITAL_COMMUNITY)
Admission: RE | Admit: 2014-07-01 | Discharge: 2014-07-01 | Disposition: A | Discharge status: Home / Self Care | Payer: BLUE CROSS/BLUE SHIELD | Source: Ambulatory Visit | Attending: Interventional Radiology | Admitting: Interventional Radiology

## 2014-07-01 ENCOUNTER — Other Ambulatory Visit: Payer: Self-pay | Admitting: Radiology

## 2014-07-01 DIAGNOSIS — Z7982 Long term (current) use of aspirin: Secondary | ICD-10-CM | POA: Insufficient documentation

## 2014-07-01 DIAGNOSIS — E782 Mixed hyperlipidemia: Secondary | ICD-10-CM | POA: Insufficient documentation

## 2014-07-01 DIAGNOSIS — Z01818 Encounter for other preprocedural examination: Secondary | ICD-10-CM | POA: Insufficient documentation

## 2014-07-01 DIAGNOSIS — Z01812 Encounter for preprocedural laboratory examination: Secondary | ICD-10-CM | POA: Diagnosis not present

## 2014-07-01 DIAGNOSIS — Z79899 Other long term (current) drug therapy: Secondary | ICD-10-CM | POA: Diagnosis not present

## 2014-07-01 DIAGNOSIS — I10 Essential (primary) hypertension: Secondary | ICD-10-CM | POA: Insufficient documentation

## 2014-07-01 DIAGNOSIS — J45909 Unspecified asthma, uncomplicated: Secondary | ICD-10-CM | POA: Insufficient documentation

## 2014-07-01 DIAGNOSIS — Z91041 Radiographic dye allergy status: Secondary | ICD-10-CM | POA: Insufficient documentation

## 2014-07-01 DIAGNOSIS — I671 Cerebral aneurysm, nonruptured: Secondary | ICD-10-CM | POA: Diagnosis not present

## 2014-07-01 LAB — COMPREHENSIVE METABOLIC PANEL
ALBUMIN: 4 g/dL (ref 3.5–5.2)
ALT: 26 U/L (ref 0–53)
AST: 24 U/L (ref 0–37)
Alkaline Phosphatase: 45 U/L (ref 39–117)
Anion gap: 5 (ref 5–15)
BILIRUBIN TOTAL: 0.5 mg/dL (ref 0.3–1.2)
BUN: 12 mg/dL (ref 6–23)
CO2: 30 mmol/L (ref 19–32)
CREATININE: 0.85 mg/dL (ref 0.50–1.35)
Calcium: 9.7 mg/dL (ref 8.4–10.5)
Chloride: 107 mmol/L (ref 96–112)
GFR calc Af Amer: >90 mL/min (ref >90)
GFR calc non Af Amer: >90 mL/min (ref >90)
GLUCOSE: 121 mg/dL — AB (ref 70–99)
Potassium: 3.5 mmol/L (ref 3.5–5.1)
SODIUM: 142 mmol/L (ref 135–145)
Total Protein: 7.1 g/dL (ref 6.0–8.3)

## 2014-07-01 LAB — CBC WITH DIFFERENTIAL/PLATELET
BASOS PCT: 1 % (ref 0–1)
Basophils Absolute: 0.1 10*3/uL (ref 0.0–0.1)
EOS ABS: 0.4 10*3/uL (ref 0.0–0.7)
Eosinophils Relative: 7 % — ABNORMAL HIGH (ref 0–5)
HCT: 42.6 % (ref 39.0–52.0)
Hemoglobin: 14.4 g/dL (ref 13.0–17.0)
LYMPHS ABS: 1.9 10*3/uL (ref 0.7–4.0)
LYMPHS PCT: 28 % (ref 12–46)
MCH: 29.1 pg (ref 26.0–34.0)
MCHC: 33.8 g/dL (ref 30.0–36.0)
MCV: 86.1 fL (ref 78.0–100.0)
MONOS PCT: 7 % (ref 3–12)
Monocytes Absolute: 0.5 10*3/uL (ref 0.1–1.0)
NEUTROS PCT: 57 % (ref 43–77)
Neutro Abs: 3.9 10*3/uL (ref 1.7–7.7)
Platelets: 248 10*3/uL (ref 150–400)
RBC: 4.95 MIL/uL (ref 4.22–5.81)
RDW: 13.5 % (ref 11.5–15.5)
WBC: 6.7 10*3/uL (ref 4.0–10.5)

## 2014-07-01 NOTE — Progress Notes (Signed)
**Note De-Identified Flansburg Obfuscation** PCP is [facility]. Patient denied having any acute cardiac issues or pulmonary issues. Patient informed Nurse that he last used his Albuterol inhaler in November 2015. Nurse saw patient in December for last PAT appointment and EKG from patients PCP was sent over from 11/19/13 (see Allison's note from 04/24/14).   Patient has allergy to IV contrast dye and informed Nurse that he was already given instructions to take Prednisone and benadryl for surgery.

## 2014-07-01 NOTE — Progress Notes (Signed)
**Note De-Identified Krogstad Obfuscation** Anesthesia Chart Review:  See my note from 04/24/14. He was for second stage treatment of large dural AVF on 04/29/14, but was postponed due to ED emergency. On 06/05/14, he did undergo embolization of large posterior auricular branch, and left middle meningeal branches of a large dural AVF. He was admitted to Neuro ICU overnight for observation following that procedure.  IR follow-up note from 06/19/14 indicates that "At the immediate post treatment arteriograms demonstrated continued albeit decreased flow into the dural fistula from the posterior auricular branch of the left external carotid artery, and possibly of the posterior branch of the superficial temporal artery."  It was felt that given the size of the AVF and also numerous branches that a stage III embolization would be needed.    Results from his last EKG, CXR, and 2013 cardiac testing outlined in my previous note.    Labs from 07/01/14 noted.    If no acute changes then I would anticipate that he could proceed as planned from an anesthesia standpoint.  George Hugh [facility] Short Stay Center/Anesthesiology Phone [phone] 07/01/2014 3:52 PM

## 2014-07-01 NOTE — Progress Notes (Signed)
**Note De-identified Norby Obfuscation**   **Note De-Identified Poucher Obfuscation** 07/01/14 1009  OBSTRUCTIVE SLEEP APNEA  Have you ever been diagnosed with sleep apnea through a sleep study? No  Do you snore loudly (loud enough to be heard through closed doors)?  0  Do you often feel tired, fatigued, or sleepy during the daytime? 0  Has anyone observed you stop breathing during your sleep? 0  Do you have, or are you being treated for high blood pressure? 1  BMI more than 35 kg/m2? 1  Age over 23 years old? 0  Neck circumference greater than 40 cm/16 inches? 1  Gender: 1  Obstructive Sleep Apnea Score 4  Score 4 or greater  Results sent to PCP   This patient has screened at risk for sleep apnea using the STOP Bang tool used during a pre-surgical visit. A score of 4 or greater is at risk for sleep apnea.

## 2014-07-01 NOTE — Pre-Procedure Instructions (Signed)
**Note De-Identified Cratty Obfuscation** Derek Donaldson  07/01/2014   Your procedure is scheduled on:  Wednesday July 03, 2014 at 8:30 AM.  Report to [facility] Admitting at 6:30 AM.  Call this number if you have problems the morning of surgery: [phone]   Remember:   Do not eat food or drink liquids after midnight.   Take these medicines the morning of surgery with A SIP OF WATER: Albuterol (if needed), Amlodipine (Norvasc) , Hydrocodone (if needed), Metoprolol (Toprol XL)   Please follow instructions regarding prednisone and benadryl before surgery   STOP/ Do not take Aleve, Naproxen, Advil, Ibuprofen, Motrin, Vitamins, Herbs, or Supplements starting today   Do not wear jewelry.  Do not wear lotions, powders, or cologne.   Men may shave face and neck.  Do not bring valuables to the hospital.  [facility] is not responsible for any belongings or valuables.               Contacts, dentures or bridgework may not be worn into surgery.  Leave suitcase in the car. After surgery it may be brought to your room.  For patients admitted to the hospital, discharge time is determined by your treatment team.               Special Instructions: Shower using CHG soap the night before and morning of your surgery   Please read over the following fact sheets that you were given: Pain Booklet, Coughing and Deep Breathing and Surgical Site Infection Prevention

## 2014-07-02 ENCOUNTER — Other Ambulatory Visit: Payer: Self-pay | Admitting: Radiology

## 2014-07-03 ENCOUNTER — Ambulatory Visit (HOSPITAL_COMMUNITY): Admission: RE | Admit: 2014-07-03 | Payer: BLUE CROSS/BLUE SHIELD | Source: Ambulatory Visit

## 2014-07-12 ENCOUNTER — Other Ambulatory Visit: Payer: Self-pay | Admitting: Radiology

## 2014-07-12 NOTE — Progress Notes (Signed)
**Note De-Identified Matera Obfuscation** Pt had previous PAT on 07/01/14 and procedure was canceled for 07/03/14. Pt made aware to arrive at 6:30 AM on Monday, July 15, 2014. Pt verbalized that he will follow pre-op pt instructions and take Prednisone and Benedryl that was recently prescribed for procedure.

## 2014-07-15 ENCOUNTER — Ambulatory Visit (HOSPITAL_COMMUNITY): Payer: BLUE CROSS/BLUE SHIELD | Admitting: Vascular Surgery

## 2014-07-15 ENCOUNTER — Encounter (HOSPITAL_COMMUNITY): Payer: Self-pay

## 2014-07-15 ENCOUNTER — Inpatient Hospital Stay (HOSPITAL_COMMUNITY): Payer: BLUE CROSS/BLUE SHIELD

## 2014-07-15 ENCOUNTER — Ambulatory Visit (HOSPITAL_COMMUNITY)
Admission: RE | Admit: 2014-07-15 | Discharge: 2014-07-15 | Disposition: A | Discharge status: Home / Self Care | Payer: BLUE CROSS/BLUE SHIELD | Source: Ambulatory Visit | Attending: Interventional Radiology | Admitting: Interventional Radiology

## 2014-07-15 ENCOUNTER — Encounter (HOSPITAL_COMMUNITY)
Admission: RE | Disposition: A | Discharge status: Home / Self Care | Payer: Self-pay | Source: Ambulatory Visit | Attending: Neurology

## 2014-07-15 ENCOUNTER — Inpatient Hospital Stay (HOSPITAL_COMMUNITY)
Admission: RE | Admit: 2014-07-15 | Discharge: 2014-07-16 | DRG: 026 | Disposition: A | Discharge status: Home / Self Care | Payer: BLUE CROSS/BLUE SHIELD | Source: Ambulatory Visit | Attending: Neurology | Admitting: Neurology

## 2014-07-15 ENCOUNTER — Encounter (HOSPITAL_COMMUNITY): Payer: Self-pay | Admitting: *Deleted

## 2014-07-15 ENCOUNTER — Encounter (HOSPITAL_COMMUNITY): Payer: Self-pay | Admitting: Anesthesiology

## 2014-07-15 DIAGNOSIS — D72829 Elevated white blood cell count, unspecified: Secondary | ICD-10-CM | POA: Insufficient documentation

## 2014-07-15 DIAGNOSIS — Z7982 Long term (current) use of aspirin: Secondary | ICD-10-CM

## 2014-07-15 DIAGNOSIS — G510 Bell's palsy: Secondary | ICD-10-CM | POA: Diagnosis present

## 2014-07-15 DIAGNOSIS — I610 Nontraumatic intracerebral hemorrhage in hemisphere, subcortical: Secondary | ICD-10-CM

## 2014-07-15 DIAGNOSIS — I10 Essential (primary) hypertension: Secondary | ICD-10-CM | POA: Diagnosis present

## 2014-07-15 DIAGNOSIS — I671 Cerebral aneurysm, nonruptured: Secondary | ICD-10-CM | POA: Insufficient documentation

## 2014-07-15 DIAGNOSIS — R29810 Facial weakness: Secondary | ICD-10-CM | POA: Diagnosis present

## 2014-07-15 DIAGNOSIS — E876 Hypokalemia: Secondary | ICD-10-CM | POA: Diagnosis not present

## 2014-07-15 DIAGNOSIS — J45909 Unspecified asthma, uncomplicated: Secondary | ICD-10-CM | POA: Diagnosis present

## 2014-07-15 DIAGNOSIS — I770 Arteriovenous fistula, acquired: Secondary | ICD-10-CM

## 2014-07-15 DIAGNOSIS — Z6841 Body Mass Index (BMI) 40.0 and over, adult: Secondary | ICD-10-CM | POA: Diagnosis not present

## 2014-07-15 DIAGNOSIS — Q251 Coarctation of aorta: Secondary | ICD-10-CM

## 2014-07-15 DIAGNOSIS — Q2730 Arteriovenous malformation, site unspecified: Secondary | ICD-10-CM

## 2014-07-15 DIAGNOSIS — E782 Mixed hyperlipidemia: Secondary | ICD-10-CM | POA: Diagnosis present

## 2014-07-15 DIAGNOSIS — K219 Gastro-esophageal reflux disease without esophagitis: Secondary | ICD-10-CM | POA: Diagnosis present

## 2014-07-15 DIAGNOSIS — I619 Nontraumatic intracerebral hemorrhage, unspecified: Secondary | ICD-10-CM | POA: Diagnosis present

## 2014-07-15 HISTORY — PX: RADIOLOGY WITH ANESTHESIA: SHX6223

## 2014-07-15 LAB — CBC
HEMATOCRIT: 37.6 % — AB (ref 39.0–52.0)
HEMOGLOBIN: 12.7 g/dL — AB (ref 13.0–17.0)
MCH: 29.1 pg (ref 26.0–34.0)
MCHC: 33.8 g/dL (ref 30.0–36.0)
MCV: 86.2 fL (ref 78.0–100.0)
Platelets: 255 10*3/uL (ref 150–400)
RBC: 4.36 MIL/uL (ref 4.22–5.81)
RDW: 13.6 % (ref 11.5–15.5)
WBC: 16.2 10*3/uL — ABNORMAL HIGH (ref 4.0–10.5)

## 2014-07-15 LAB — PROTIME-INR
INR: 1.02 (ref 0.00–1.49)
INR: 1.08 (ref 0.00–1.49)
Prothrombin Time: 13.5 seconds (ref 11.6–15.2)
Prothrombin Time: 14.1 seconds (ref 11.6–15.2)

## 2014-07-15 LAB — GLUCOSE, CAPILLARY
GLUCOSE-CAPILLARY: 123 mg/dL — AB (ref 70–99)
Glucose-Capillary: 104 mg/dL — ABNORMAL HIGH (ref 70–99)

## 2014-07-15 LAB — COMPREHENSIVE METABOLIC PANEL
ALT: 26 U/L (ref 0–53)
ANION GAP: 5 (ref 5–15)
AST: 20 U/L (ref 0–37)
Albumin: 3.6 g/dL (ref 3.5–5.2)
Alkaline Phosphatase: 41 U/L (ref 39–117)
BUN: 8 mg/dL (ref 6–23)
CHLORIDE: 109 mmol/L (ref 96–112)
CO2: 23 mmol/L (ref 19–32)
CREATININE: 0.79 mg/dL (ref 0.50–1.35)
Calcium: 8.5 mg/dL (ref 8.4–10.5)
GFR calc Af Amer: >90 mL/min (ref >90)
GLUCOSE: 127 mg/dL — AB (ref 70–99)
Potassium: 3.5 mmol/L (ref 3.5–5.1)
Sodium: 137 mmol/L (ref 135–145)
TOTAL PROTEIN: 6.4 g/dL (ref 6.0–8.3)
Total Bilirubin: 0.6 mg/dL (ref 0.3–1.2)

## 2014-07-15 LAB — POCT ACTIVATED CLOTTING TIME
ACTIVATED CLOTTING TIME: 104 s
Activated Clotting Time: 122 seconds
Activated Clotting Time: 116 seconds

## 2014-07-15 SURGERY — RADIOLOGY WITH ANESTHESIA
Anesthesia: Monitor Anesthesia Care

## 2014-07-15 MED ORDER — LABETALOL HCL 5 MG/ML IV SOLN
INTRAVENOUS | Status: DC | PRN
  Administered 2014-07-15 (×4): 10 mg via INTRAVENOUS

## 2014-07-15 MED ORDER — HYDROCHLOROTHIAZIDE 12.5 MG PO CAPS
12.5000 mg | ORAL_CAPSULE | Freq: Every day | ORAL | Status: DC
  Administered 2014-07-16: 12.5 mg via ORAL
  Filled 2014-07-15: qty 1

## 2014-07-15 MED ORDER — NIMODIPINE 30 MG PO CAPS
0.0000 mg | ORAL_CAPSULE | ORAL | Status: AC
  Administered 2014-07-15: 60 mg via ORAL
  Filled 2014-07-15 (×2): qty 2

## 2014-07-15 MED ORDER — STROKE: EARLY STAGES OF RECOVERY BOOK
Freq: Once | Status: AC
  Administered 2014-07-15: 14:00:00
  Filled 2014-07-15: qty 1

## 2014-07-15 MED ORDER — ARTIFICIAL TEARS OP OINT
TOPICAL_OINTMENT | OPHTHALMIC | Status: DC | PRN
  Administered 2014-07-15: 1 via OPHTHALMIC

## 2014-07-15 MED ORDER — VALSARTAN-HYDROCHLOROTHIAZIDE 320-25 MG PO TABS
1.0000 | ORAL_TABLET | Freq: Every day | ORAL | Status: DC
Start: 2014-07-16 — End: 2014-07-15

## 2014-07-15 MED ORDER — ASPIRIN EC 325 MG PO TBEC
325.0000 mg | DELAYED_RELEASE_TABLET | ORAL | Status: AC
  Administered 2014-07-15: 325 mg via ORAL
  Filled 2014-07-15 (×2): qty 1

## 2014-07-15 MED ORDER — PROPOFOL 10 MG/ML IV BOLUS
INTRAVENOUS | Status: DC | PRN
  Administered 2014-07-15: 90 mg via INTRAVENOUS
  Administered 2014-07-15: 360 mg via INTRAVENOUS
  Administered 2014-07-15: 100 mg via INTRAVENOUS
  Administered 2014-07-15: 50 mg via INTRAVENOUS

## 2014-07-15 MED ORDER — GLYCOPYRROLATE 0.2 MG/ML IJ SOLN
INTRAMUSCULAR | Status: DC | PRN
  Administered 2014-07-15: 0.6 mg via INTRAVENOUS

## 2014-07-15 MED ORDER — HEPARIN SODIUM (PORCINE) 1000 UNIT/ML IJ SOLN
INTRAMUSCULAR | Status: DC | PRN
  Administered 2014-07-15: 3000 [IU] via INTRAVENOUS
  Administered 2014-07-15 (×2): 500 [IU] via INTRAVENOUS
  Administered 2014-07-15 (×2): 1000 [IU] via INTRAVENOUS

## 2014-07-15 MED ORDER — FENTANYL CITRATE 0.05 MG/ML IJ SOLN
INTRAMUSCULAR | Status: DC | PRN
  Administered 2014-07-15: 50 ug via INTRAVENOUS
  Administered 2014-07-15: 100 ug via INTRAVENOUS
  Administered 2014-07-15 (×2): 50 ug via INTRAVENOUS
  Administered 2014-07-15: 100 ug via INTRAVENOUS
  Administered 2014-07-15: 50 ug via INTRAVENOUS

## 2014-07-15 MED ORDER — NEOSTIGMINE METHYLSULFATE 10 MG/10ML IV SOLN
INTRAVENOUS | Status: DC | PRN
  Administered 2014-07-15: 5 mg via INTRAVENOUS

## 2014-07-15 MED ORDER — MIDAZOLAM HCL 5 MG/5ML IJ SOLN
INTRAMUSCULAR | Status: DC | PRN
  Administered 2014-07-15 (×2): 2 mg via INTRAVENOUS

## 2014-07-15 MED ORDER — LIDOCAINE HCL 1 % IJ SOLN
INTRAMUSCULAR | Status: AC
  Filled 2014-07-15: qty 20

## 2014-07-15 MED ORDER — ACETAMINOPHEN 500 MG PO TABS: 1000.0000 mg | ORAL_TABLET | Freq: Four times a day (QID) | ORAL | Status: DC | PRN

## 2014-07-15 MED ORDER — SODIUM CHLORIDE 0.9 % IV SOLN
INTRAVENOUS | Status: DC
  Administered 2014-07-15: 15:00:00 via INTRAVENOUS

## 2014-07-15 MED ORDER — FENOFIBRATE 160 MG PO TABS
160.0000 mg | ORAL_TABLET | Freq: Every day | ORAL | Status: DC
  Administered 2014-07-16: 160 mg via ORAL
  Filled 2014-07-15 (×2): qty 1

## 2014-07-15 MED ORDER — PANTOPRAZOLE SODIUM 40 MG IV SOLR
40.0000 mg | Freq: Every day | INTRAVENOUS | Status: DC
  Administered 2014-07-15: 40 mg via INTRAVENOUS
  Filled 2014-07-15 (×2): qty 40

## 2014-07-15 MED ORDER — LIDOCAINE HCL (CARDIAC) 20 MG/ML IV SOLN
INTRAVENOUS | Status: DC | PRN
  Administered 2014-07-15: 80 mg via INTRAVENOUS

## 2014-07-15 MED ORDER — ACETAMINOPHEN 650 MG RE SUPP
650.0000 mg | RECTAL | Status: DC | PRN
  Filled 2014-07-15: qty 1

## 2014-07-15 MED ORDER — SODIUM CHLORIDE 0.9 % IV SOLN
INTRAVENOUS | Status: DC
  Administered 2014-07-16: 05:00:00 via INTRAVENOUS

## 2014-07-15 MED ORDER — SENNOSIDES-DOCUSATE SODIUM 8.6-50 MG PO TABS
1.0000 | ORAL_TABLET | Freq: Two times a day (BID) | ORAL | Status: DC
  Administered 2014-07-15 – 2014-07-16 (×2): 1 via ORAL
  Filled 2014-07-15 (×3): qty 1

## 2014-07-15 MED ORDER — IRBESARTAN 300 MG PO TABS
300.0000 mg | ORAL_TABLET | Freq: Every day | ORAL | Status: DC
  Administered 2014-07-16: 300 mg via ORAL
  Filled 2014-07-15: qty 1

## 2014-07-15 MED ORDER — SUCCINYLCHOLINE CHLORIDE 20 MG/ML IJ SOLN
INTRAMUSCULAR | Status: DC | PRN
  Administered 2014-07-15: 120 mg via INTRAVENOUS

## 2014-07-15 MED ORDER — NITROGLYCERIN 1 MG/10 ML FOR IR/CATH LAB
INTRA_ARTERIAL | Status: AC
  Administered 2014-07-15: 25 ug via INTRA_ARTERIAL
  Filled 2014-07-15: qty 10

## 2014-07-15 MED ORDER — ACETAMINOPHEN 650 MG RE SUPP: 650.0000 mg | Freq: Four times a day (QID) | RECTAL | Status: DC | PRN

## 2014-07-15 MED ORDER — SODIUM CHLORIDE 0.9 % IV SOLN
Freq: Once | INTRAVENOUS | Status: AC
  Administered 2014-07-15 (×2): via INTRAVENOUS

## 2014-07-15 MED ORDER — AMLODIPINE BESYLATE 10 MG PO TABS
10.0000 mg | ORAL_TABLET | Freq: Every day | ORAL | Status: DC
  Administered 2014-07-16: 10 mg via ORAL
  Filled 2014-07-15 (×2): qty 1

## 2014-07-15 MED ORDER — ONDANSETRON HCL 4 MG/2ML IJ SOLN
INTRAMUSCULAR | Status: DC | PRN
  Administered 2014-07-15 (×2): 4 mg via INTRAVENOUS

## 2014-07-15 MED ORDER — ACETAMINOPHEN 325 MG PO TABS
650.0000 mg | ORAL_TABLET | ORAL | Status: DC | PRN
  Filled 2014-07-15: qty 2

## 2014-07-15 MED ORDER — ESMOLOL HCL 10 MG/ML IV SOLN
INTRAVENOUS | Status: DC | PRN
  Administered 2014-07-15 (×2): 30 mg via INTRAVENOUS

## 2014-07-15 MED ORDER — LABETALOL HCL 5 MG/ML IV SOLN
10.0000 mg | INTRAVENOUS | Status: DC | PRN
  Administered 2014-07-15: 20 mg via INTRAVENOUS
  Administered 2014-07-15: 10 mg via INTRAVENOUS
  Administered 2014-07-16 (×2): 20 mg via INTRAVENOUS
  Filled 2014-07-15: qty 8
  Filled 2014-07-15 (×2): qty 4
  Filled 2014-07-15: qty 8

## 2014-07-15 MED ORDER — VECURONIUM BROMIDE 10 MG IV SOLR
INTRAVENOUS | Status: DC | PRN
  Administered 2014-07-15: 10 mg via INTRAVENOUS
  Administered 2014-07-15: 2 mg via INTRAVENOUS
  Administered 2014-07-15 (×2): 4 mg via INTRAVENOUS

## 2014-07-15 MED ORDER — ONDANSETRON HCL 4 MG/2ML IJ SOLN
4.0000 mg | Freq: Four times a day (QID) | INTRAMUSCULAR | Status: DC | PRN
  Filled 2014-07-15: qty 2

## 2014-07-15 MED ORDER — DEXAMETHASONE SODIUM PHOSPHATE 10 MG/ML IJ SOLN
INTRAMUSCULAR | Status: DC | PRN
  Administered 2014-07-15: 10 mg via INTRAVENOUS

## 2014-07-15 MED ORDER — CEFAZOLIN SODIUM 10 G IJ SOLR
3.0000 g | Freq: Once | INTRAMUSCULAR | Status: AC
  Administered 2014-07-15: 3 g via INTRAVENOUS
  Filled 2014-07-15 (×2): qty 3000

## 2014-07-15 MED ORDER — IOHEXOL 300 MG/ML  SOLN
150.0000 mL | Freq: Once | INTRAMUSCULAR | Status: AC | PRN
  Administered 2014-07-15: 200 mL via INTRA_ARTERIAL

## 2014-07-15 MED ORDER — METOPROLOL SUCCINATE ER 25 MG PO TB24
25.0000 mg | ORAL_TABLET | Freq: Every day | ORAL | Status: DC
  Administered 2014-07-16: 25 mg via ORAL
  Filled 2014-07-15: qty 1

## 2014-07-15 MED ORDER — NICARDIPINE HCL IN NACL 20-0.86 MG/200ML-% IV SOLN
5.0000 mg/h | INTRAVENOUS | Status: DC
  Filled 2014-07-15: qty 200

## 2014-07-15 NOTE — Progress Notes (Signed)
**Note De-Identified Delellis Obfuscation** R femoral exoseal deployed per [employer] RT hemostatsis achieved, dressing applied, + pulses to RLE

## 2014-07-15 NOTE — H&P (Signed)
**Note De-Identified Kwong Obfuscation** H&P    Chief Complaint: Code stroke  HPI:                                                                                                                                         Derek Donaldson is an 44 y.o. male presenting to [facility] for elective of 4 lt ECA arterial feeders supplying the large fast flow fistula using liquid ONYX.  Post procedure after patient was awoken he was noted to have a left facial droop from forehead to lower face.  Code stroke was initiated and patient was brought to CT. Small bleed was seen and patient was brought to ICU.  Currently he is alert and able to follow all commands. He is in no distress and has no complaints.   Date last known well: Date: 07/15/2014 Time last known well: Time: 09:17 tPA Given: No: ICH  Past Medical History  Diagnosis Date  . Mixed hyperlipidemia     takes Fenofibrate daily  . Cancer 2010    skin ( unsure of the pathology)- removed  . Shortness of breath     with exertion  . Pneumonia 2005    hx of  . History of bronchitis   . Chiari malformation   . Numbness     left arm from chiari malformation  . History of kidney stones   . Essential hypertension, benign     takes Metoprolol,Diovan,and Amlodipine daily  . Muscle spasm     takes Flexeril daily as needed  . Asthma     Albuterol inhaler prn;just a small touch    Past Surgical History  Procedure Laterality Date  . Stapedectomy  04/23/2003    Revision left stapedectomy  . Vasectomy    . Ercp  03/21/2012    Procedure: ENDOSCOPIC RETROGRADE CHOLANGIOPANCREATOGRAPHY (ERCP);  Surgeon: Jeryl Columbia, MD;  Location: [facility];  Service: Endoscopy;  Laterality: N/A;  aqntibiotics and type and screen  . Suboccipital craniectomy cervical laminectomy N/A 12/21/2013    Procedure: Aborted Cranioplasty For Chiari Malformation (Could not complete procedure);  Surgeon: Ashok Pall, MD;  Location: [place] [facility];  Service: Neurosurgery;  Laterality: N/A;  Aborted  Cranioplasty For Chiari Malformation  . Cholecystectomy  11/13  . Mri      x 2  . Cerebral angiogram  12/2013  . Radiology with anesthesia N/A 02/20/2014    Procedure: RADIOLOGY WITH ANESTHESIA EMBOLIZATION;  Surgeon: Rob Hickman, MD;  Location: [place];  Service: Radiology;  Laterality: N/A;  . Radiology with anesthesia N/A 04/29/2014    Procedure: STAGE TWO EMBOLIZATION;  Surgeon: Luanne Bras, MD;  Location: [place];  Service: Radiology;  Laterality: N/A;  . Radiology with anesthesia N/A 06/05/2014    Procedure: Embolization;  Surgeon: Rob Hickman, MD;  Location: [place];  Service: Radiology;  Laterality: N/A;    Family History  Problem Relation Age **Note De-Identified Rothenberger Obfuscation** of Onset  . Stroke Paternal Grandfather   . Cancer Maternal Grandfather     unknown type  . Asthma Mother   . Asthma Brother   . Heart failure Maternal Grandmother   . Aneurysm Father    Social History:  reports that he has never smoked. He has never used smokeless tobacco. He reports that he does not drink alcohol or use illicit drugs.  Allergies:  Allergies  Allergen Reactions  . Contrast Media [Iodinated Diagnostic Agents] Anaphylaxis  . Shellfish Allergy Other (See Comments)    Child hood allergy    Medications:                                                                                                                           Prior to Admission:  Prescriptions prior to admission  Medication Sig Dispense Refill Last Dose  . amLODipine (NORVASC) 10 MG tablet Take 10 mg by mouth daily before breakfast.    07/15/2014 at 0440  . aspirin EC 81 MG tablet Take 81 mg by mouth daily.   Past Week at Unknown time  . EPINEPHrine 0.3 mg/0.3 mL IJ SOAJ injection Inject 0.3 mg into the muscle as needed (emergent allergic reaction).   More than a month at Unknown time  . fenofibrate micronized (ANTARA) 130 MG capsule Take 130 mg by mouth daily before breakfast.   Past Week at Unknown time  . HYDROcodone-acetaminophen  (NORCO/VICODIN) 5-325 MG per tablet Take 1 tablet by mouth every 6 (six) hours as needed for moderate pain.   Past Week at Unknown time  . ibuprofen (ADVIL,MOTRIN) 200 MG tablet Take 400 mg by mouth every 6 (six) hours as needed for mild pain or moderate pain.   Past Month at Unknown time  . metoprolol succinate (TOPROL-XL) 25 MG 24 hr tablet Take 25 mg by mouth daily.   07/15/2014 at 0440  . valsartan-hydrochlorothiazide (DIOVAN-HCT) 320-25 MG per tablet Take 1 tablet by mouth daily before breakfast.    Past Week at Unknown time  . albuterol (PROVENTIL HFA;VENTOLIN HFA) 108 (90 BASE) MCG/ACT inhaler Inhale 2 puffs into the lungs every 4 (four) hours as needed for wheezing or shortness of breath.   More than a month at Unknown time  . cyclobenzaprine (FLEXERIL) 10 MG tablet Take 1 tablet (10 mg total) by mouth 3 (three) times daily as needed for muscle spasms. (Patient not taking: Reported on 04/22/2014) 60 tablet 0 Not Taking at Unknown time  . oxyCODONE-acetaminophen (PERCOCET/ROXICET) 5-325 MG per tablet Take 1-2 tablets by mouth every 4 (four) hours as needed for severe pain. (Patient not taking: Reported on 04/22/2014) 15 tablet 0 Completed Course at Unknown time  . tamsulosin (FLOMAX) 0.4 MG CAPS capsule Take 1 capsule (0.4 mg total) by mouth daily. (Patient not taking: Reported on 04/22/2014) 30 capsule 0 Completed Course at Unknown time   Scheduled: .  stroke: mapping our early stages of recovery book   Does not apply Once  . **Note De-Identified [place] Obfuscation** pantoprazole (PROTONIX) IV  40 mg Intravenous QHS  . senna-docusate  1 tablet Oral BID    ROS:                                                                                                                                       History obtained from the patient  General ROS: negative for - chills, fatigue, fever, night sweats, weight gain or weight loss Psychological ROS: negative for - behavioral disorder, hallucinations, memory difficulties, mood swings or suicidal  ideation Ophthalmic ROS: negative for - blurry vision, double vision, eye pain or loss of vision ENT ROS: negative for - epistaxis, nasal discharge, oral lesions, sore throat, tinnitus or vertigo Allergy and Immunology ROS: negative for - hives or itchy/watery eyes Hematological and Lymphatic ROS: negative for - bleeding problems, bruising or swollen lymph nodes Endocrine ROS: negative for - galactorrhea, hair pattern changes, polydipsia/polyuria or temperature intolerance Respiratory ROS: negative for - cough, hemoptysis, shortness of breath or wheezing Cardiovascular ROS: negative for - chest pain, dyspnea on exertion, edema or irregular heartbeat Gastrointestinal ROS: negative for - abdominal pain, diarrhea, hematemesis, nausea/vomiting or stool incontinence Genito-Urinary ROS: negative for - dysuria, hematuria, incontinence or urinary frequency/urgency Musculoskeletal ROS: negative for - joint swelling or muscular weakness Neurological ROS: as noted in HPI Dermatological ROS: negative for rash and skin lesion changes  Neurologic Examination:                                                                                                      Blood pressure 168/93, pulse 97, temperature 97.9 F (36.6 C), temperature source Oral, resp. rate 20, height 5' 10.5" (1.791 m), weight 133.811 kg (295 lb), SpO2 97 %.  HEENT-  Normocephalic, no lesions, without obvious abnormality.  Normal external eye and conjunctiva.  Normal TM's bilaterally.  Normal auditory canals and external ears. Normal external nose, mucus membranes and septum.  Normal pharynx. Cardiovascular- S1, S2 normal, pulses palpable throughout   Lungs- chest clear, no wheezing, rales, normal symmetric air entry Abdomen- normal findings: bowel sounds normal Extremities- no edema Lymph-no adenopathy palpable Musculoskeletal-no joint tenderness, deformity or swelling Skin-warm and dry, no hyperpigmentation, vitiligo, or suspicious  lesions  Neurological Examination Mental Status: Alert, oriented, thought content appropriate.  Speech fluent without evidence of aphasia.  Able to follow 3 step commands without difficulty. Cranial Nerves: II: Discs flat bilaterally; Visual fields grossly normal, pupils equal, round, reactive to light and accommodation III,IV, VI: ptosis not present, extra-ocular **Note De-Identified Nordling Obfuscation** motions intact bilaterally V,VII: smile asymmetric on the left including left frontalis, orbicular oculi and orbicular oris.  facial light touch sensation normal bilaterally VIII: hearing normal bilaterally IX,X: gag reflex present XI: bilateral shoulder shrug XII: midline tongue extension Motor: Right : Upper extremity   5/5    Left:     Upper extremity   5/5  Lower extremity   5/5     Lower extremity   5/5 Tone and bulk:normal tone throughout; no atrophy noted Sensory: Pinprick and light touch intact throughout, bilaterally Deep Tendon Reflexes: 2+ and symmetric throughout Plantars: Right: downgoing   Left: downgoing Cerebellar: normal finger-to-nose, and normal heel-to-shin test Gait: not able to assess       Lab Results: Basic Metabolic Panel: No results for input(s): NA, K, CL, CO2, GLUCOSE, BUN, CREATININE, CALCIUM, MG, PHOS in the last 168 hours.  Liver Function Tests: No results for input(s): AST, ALT, ALKPHOS, BILITOT, PROT, ALBUMIN in the last 168 hours. No results for input(s): LIPASE, AMYLASE in the last 168 hours. No results for input(s): AMMONIA in the last 168 hours.  CBC: No results for input(s): WBC, NEUTROABS, HGB, HCT, MCV, PLT in the last 168 hours.  Cardiac Enzymes: No results for input(s): CKTOTAL, CKMB, CKMBINDEX, TROPONINI in the last 168 hours.  Lipid Panel: No results for input(s): CHOL, TRIG, HDL, CHOLHDL, VLDL, LDLCALC in the last 168 hours.  CBG: No results for input(s): GLUCAP in the last 168 hours.  Microbiology: Results for orders placed or performed during the hospital  encounter of 06/05/14  MRSA PCR Screening     Status: None   Collection Time: 06/05/14  1:16 PM  Result Value Ref Range Status   MRSA by PCR NEGATIVE NEGATIVE Final    Comment:        The GeneXpert MRSA Assay (FDA approved for NASAL specimens only), is one component of a comprehensive MRSA colonization surveillance program. It is not intended to diagnose MRSA infection nor to guide or monitor treatment for MRSA infections.     Coagulation Studies:  Recent Labs  07/15/14 0832  LABPROT 13.5  INR 1.02    Imaging: No results found.  Assessment and plan discussed with with attending physician and they are in agreement.    Etta Quill PA-C [facility] [phone]  07/15/2014, 2:09 PM     Stroke Risk Factors - none   Assessment: 44 y.o. male with new facial droop after onyx embolization who was found to have a linear subcortical hemorrhage. He is being admitted for monitoring of the acute ICH. His physical exam had the appearance more of a peripheral facial palsy, however the CT does appear to show a subcortical hemorrhage.   1) Admit to ICU 2) no antiplatelets or anticoagulants 3) blood pressure control per IR 4) Frequent neuro checks 5) If symptoms worsen or there is decreased mental status, repeat stat head CT   This patient is critically ill and at significant risk of neurological worsening, death and care requires constant monitoring of vital signs, hemodynamics,respiratory and cardiac monitoring, neurological assessment, discussion with family, other specialists and medical decision making of high complexity. I spent 45 minutes of neurocritical care time  in the care of  this patient.  Roland Rack, MD [facility] [phone]  If 7pm- 7am, please page neurology on call as listed in [place]. 07/15/2014  5:31 PM

## 2014-07-15 NOTE — H&P (Signed)
**Note De-Identified Dena Obfuscation** Chief Complaint: Chiari malformation Large dural arteriovenous fistula   Referring Physician(s): Deveshwar,Sanjeev K Dr Christella Noa  History of Present Illness: Derek Donaldson is a 44 y.o. 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 Mixed hyperlipidemia     takes Fenofibrate daily  . Cancer 2010    skin ( unsure of the pathology)- removed  . Shortness of breath     with exertion  . Pneumonia 2005    hx of  . History of bronchitis   . Chiari malformation   . Numbness     left arm from chiari malformation  . History of kidney stones   . Essential hypertension, benign     takes Metoprolol,Diovan,and Amlodipine daily  . Muscle spasm     takes Flexeril daily as needed  . Asthma     Albuterol inhaler prn;just a small touch    Past Surgical History  Procedure Laterality Date  . Stapedectomy  04/23/2003    Revision left stapedectomy  . Vasectomy    . Ercp  03/21/2012    Procedure: ENDOSCOPIC RETROGRADE CHOLANGIOPANCREATOGRAPHY (ERCP);  Surgeon: Jeryl Columbia, MD;  Location: [facility];  Service: Endoscopy;  Laterality: N/A;  aqntibiotics and type and screen  . Suboccipital craniectomy cervical laminectomy N/A 12/21/2013    Procedure: Aborted Cranioplasty For Chiari Malformation (Could not complete procedure);  Surgeon: Ashok Pall, MD;  Location: [place] [facility];  Service: Neurosurgery;  Laterality: N/A;  Aborted Cranioplasty For Chiari Malformation  . Cholecystectomy  11/13  . Mri      x 2  . Cerebral angiogram  12/2013  . Radiology with anesthesia N/A 02/20/2014    Procedure: RADIOLOGY WITH ANESTHESIA EMBOLIZATION;  Surgeon:  Rob Hickman, MD;  Location: [place];  Service: Radiology;  Laterality: N/A;  . Radiology with anesthesia N/A 04/29/2014    Procedure: STAGE TWO EMBOLIZATION;  Surgeon: Luanne Bras, MD;  Location: [place];  Service: Radiology;  Laterality: N/A;  . Radiology with anesthesia N/A 06/05/2014    Procedure: Embolization;  Surgeon: Rob Hickman, MD;  Location: [place];  Service: Radiology;  Laterality: N/A;    Allergies: Contrast media and Shellfish allergy  Medications: Prior to Admission medications   Medication Sig Start Date End Date Taking? Authorizing Provider  albuterol (PROVENTIL HFA;VENTOLIN HFA) 108 (90 BASE) MCG/ACT inhaler Inhale 2 puffs into the lungs every 4 (four) hours as needed for wheezing or shortness of breath.   Yes Historical Provider, MD  amLODipine (NORVASC) 10 MG tablet Take 10 mg by mouth daily before breakfast.    Yes Historical Provider, MD  aspirin EC 81 MG tablet Take 81 mg by mouth daily.   Yes Historical Provider, MD  EPINEPHrine 0.3 mg/0.3 mL IJ SOAJ injection Inject 0.3 mg into the muscle as needed (emergent allergic reaction).   Yes Historical Provider, MD  fenofibrate micronized (ANTARA) 130 MG capsule Take 130 mg by mouth daily before breakfast.   Yes Historical Provider, MD  ibuprofen (ADVIL,MOTRIN) 200 MG tablet Take 400 mg by mouth every 6 (six) hours as needed for mild pain or moderate pain.   Yes Historical Provider, MD  metoprolol succinate (TOPROL-XL) 25 **Note De-Identified Stell Obfuscation** MG 24 hr tablet Take 25 mg by mouth daily.   Yes Historical Provider, MD  valsartan-hydrochlorothiazide (DIOVAN-HCT) 320-25 MG per tablet Take 1 tablet by mouth daily before breakfast.    Yes Historical Provider, MD  cyclobenzaprine (FLEXERIL) 10 MG tablet Take 1 tablet (10 mg total) by mouth 3 (three) times daily as needed for muscle spasms. Patient not taking: Reported on 04/22/2014 12/22/13   Ashok Pall, MD  HYDROcodone-acetaminophen (NORCO/VICODIN) 5-325 MG per tablet Take 1 tablet by mouth  every 6 (six) hours as needed for moderate pain.    Historical Provider, MD  oxyCODONE-acetaminophen (PERCOCET/ROXICET) 5-325 MG per tablet Take 1-2 tablets by mouth every 4 (four) hours as needed for severe pain. Patient not taking: Reported on 04/22/2014 02/03/14   Leota Jacobsen, MD  tamsulosin (FLOMAX) 0.4 MG CAPS capsule Take 1 capsule (0.4 mg total) by mouth daily. Patient not taking: Reported on 04/22/2014 02/03/14   Leota Jacobsen, MD     Family History  Problem Relation Age of Onset  . Stroke Paternal Grandfather   . Cancer Maternal Grandfather     unknown type  . Asthma Mother   . Asthma Brother   . Heart failure Maternal Grandmother   . Aneurysm Father       Review of Systems: A 12 point ROS discussed and pertinent positives are indicated in the HPI above.  All other systems are negative.  Review of Systems  Constitutional: Negative for fever, activity change, appetite change and fatigue.  HENT: Positive for ear pain and tinnitus. Negative for hearing loss and trouble swallowing.   Respiratory: Negative for cough and shortness of breath.   Musculoskeletal: Negative for gait problem.  Neurological: Positive for headaches. Negative for dizziness, tremors, seizures, syncope, facial asymmetry, speech difficulty, weakness, light-headedness and numbness.  Psychiatric/Behavioral: Negative for behavioral problems and confusion.    Vital Signs: There were no vitals taken for this visit.  Physical Exam  Constitutional: He is oriented to person, place, and time. He appears well-developed and well-nourished.  HENT:  Head: Atraumatic.  Eyes: EOM are normal.  Neck: Neck supple.  Cardiovascular: Normal rate, regular rhythm and normal heart sounds.   No murmur heard. Pulmonary/Chest: Effort normal and breath sounds normal. He has no wheezes.  Abdominal: Soft. There is no tenderness.  Musculoskeletal: Normal range of motion. He exhibits no edema or tenderness.  Neurological: He  is alert and oriented to person, place, and time.  Skin: Skin is warm and dry.  Psychiatric: He has a normal mood and affect. His behavior is normal. Judgment and thought content normal.  Nursing note and vitals reviewed.   Mallampati Score:  MD Evaluation Airway: WNL Heart: WNL Abdomen: WNL Chest/ Lungs: WNL ASA  Classification: 3 Mallampati/Airway Score: One  Imaging: Ir Radiologist Eval & Mgmt  06/20/2014   EXAM: ESTABLISHED PATIENT OFFICE VISIT  CHIEF COMPLAINT: Bilateral pulsatile tinnitus. History of large dural AV malformation with partial embolization.  Current Pain Level: 1-10  HISTORY OF PRESENT ILLNESS: The patient is a 44 year old right-handed gentleman who is status post 2 stages of a large fast flow dural AV malformation with multiple arterial feeders from the external carotid arteries bilaterally left greater than right, the left vertebral artery, the left ascending cervical branch of the thyrocervical trunk and possibly the hypophyseal trunk of the internal carotid artery.  The patient underwent the second stage of his embolization approximately 2 weeks go where 3 large arterial feeders were embolized with Onyx 18 and **Note De-Identified [place] Obfuscation** 4. At the immediate post treatment arteriograms demonstrated continued albeit decreased flow into the dural fistula from the posterior auricular branch of the left external carotid artery, and possibly of the posterior branch of the superficial temporal artery.  The patient returns today with his wife for follow-up.  The patient is doing significantly better from the point upon review of left-sided pulsatile tinnitus which he claims is a 4/10 compared to prior to the most recent embolization. The right-sided steady 4/10 pulsatile tinnitus remains unchanged.  He denies any symptoms of visual aberrations, conjunctival injection, change in his taste of foods, or difficulty swallowing.  He denies any visual acuity difficulties in terms of blindness, or double vision.  No  symptoms of complete hearing loss either.  He denies any recent chills, fevers or rigors. His remaining review of systems remains within normal limits.  Of note is a transient episode of severe pain involving his left frontal temporal region, and the retro mastoid region and the jaw region approximately 5 days post embolization. This responded to increasing his hydrocodone. At the present time he takes no anti-inflammatories or pain medications.  Past medical history and surgical history already on chart unchanged.  Present medications: Valsartan. Amlodipine. Metoprolol. Aspirin 81 mg. Fenofibrate. ProAir inhaler as needed.  Allergies:  Contrast dye and shellfish.  Surgical history and family history also unchanged.  PHYSICAL EXAMINATION: On examination in no acute distress.  Affect normal.  On inspection, the patient continues to have mild facial swelling overlying the left buccal, mastoid and the left ear pinna.  However, there is no conjunctiva injection or proptosis noted.  Neurologically, otherwise, fully intact.  ASSESSMENT AND PLAN: The patient's immediate post treatment second stage embolization radiographs were reviewed with him and his wife.  The other feeders from the left vertebral artery, and the ascending branch of the left thyrocervical trunk were brought to their attention.  It was felt given the size of the fistula and also the numerous branches that a stage III embolization would be needed. This was discussed with him and his spouse. Reasons were discussed.  The patient and his wife are in agreement to proceed with third stage embolization probably the last week in February or the first week in March.  Questions were and answered to their satisfaction. This will be scheduled as planned.   Electronically Signed   By: Luanne Bras M.D.   On: 06/19/2014 15:04    Labs:  CBC:  Recent Labs  04/23/14 0930 06/03/14 0917 06/06/14 0500 07/01/14 1015  WBC 7.1 7.2 12.8* 6.7  HGB 14.5 15.0  12.6* 14.4  HCT 42.7 44.1 38.1* 42.6  PLT 270 269 228 248    COAGS:  Recent Labs  01/11/14 0656 02/14/14 1223 04/23/14 0930 06/03/14 0917  INR 1.06 1.00 0.99 1.05  APTT 28 23* 28 29    BMP:  Recent Labs  04/23/14 0930 06/03/14 0917 06/06/14 0500 07/01/14 1015  NA 143 137 137 142  K 3.8 3.1* 3.3* 3.5  CL 104 103 106 107  CO2 25 29 24 30   GLUCOSE 95 139* 127* 121*  BUN 10 8 8 12   CALCIUM 9.9 9.7 8.4 9.7  CREATININE 0.75 0.86 0.70 0.85  GFRNONAA >90 >90 >90 >90  GFRAA >90 >90 >90 >90    LIVER FUNCTION TESTS:  Recent Labs  02/14/14 1223 04/23/14 0930 06/03/14 0917 07/01/14 1015  BILITOT 0.3 0.3 0.7 0.5  AST 18 17 31 24   ALT 23 28 41 26  ALKPHOS **Note De-Identified Joslyn Obfuscation** 44 46 48 45  PROT 8.1 7.6 7.3 7.1  ALBUMIN 4.4 4.3 4.3 4.0    TUMOR MARKERS: No results for input(s): AFPTM, CEA, CA199, CHROMGRNA in the last 8760 hours.  Assessment and Plan:  Chiari malformation Large dural AVF Now scheduled for cerebral arteriogram with stage 3 embolization of dural arteriovenous fistula. Pt aware of procedure benefits and risks including but not limited to Infection; bleeding; CVA; vessel damage Agreeable to proceed Consent signed and in chart Pt aware he will be admitted to Neuro ICU if intervention is performed Plan for discharge in am  Thank you for this interesting consult.  I greatly enjoyed meeting Derek Donaldson and look forward to participating in their care.  Signed: Shanta Dorvil A 07/15/2014, 7:39 AM   I spent a total of  20 Minutes   in face to face in clinical consultation, greater than 50% of which was counseling/coordinating care for cerebral arteriogram with stage 3 embolization of dural AVF

## 2014-07-15 NOTE — Anesthesia Preprocedure Evaluation (Signed)
**Note De-Identified Kotecki Obfuscation** Anesthesia Evaluation  Patient identified by MRN, date of birth, ID band Patient awake    Reviewed: Allergy & Precautions, NPO status , Patient's Chart, lab work & pertinent test results, reviewed documented beta blocker date and time   History of Anesthesia Complications Negative for: history of anesthetic complications  Airway Mallampati: IV  TM Distance: >3 FB Neck ROM: Full    Dental  (+) Teeth Intact, Dental Advisory Given   Pulmonary shortness of breath, asthma ,  breath sounds clear to auscultation        Cardiovascular hypertension, Pt. on medications and Pt. on home beta blockers Rhythm:Regular Rate:Normal     Neuro/Psych Chiari     GI/Hepatic Neg liver ROS, GERD-  Controlled,  Endo/Other  Morbid obesity  Renal/GU      Musculoskeletal   Abdominal   Peds  Hematology   Anesthesia Other Findings   Reproductive/Obstetrics negative OB ROS                             Anesthesia Physical Anesthesia Plan  ASA: III  Anesthesia Plan: MAC and General ETT   Post-op Pain Management:    Induction:   Airway Management Planned:   Additional Equipment:   Intra-op Plan:   Post-operative Plan:   Informed Consent:   Plan Discussed with:   Anesthesia Plan Comments:         Anesthesia Quick Evaluation

## 2014-07-15 NOTE — Anesthesia Procedure Notes (Signed)
**Note De-Identified Loden Obfuscation** Procedure Name: Intubation Date/Time: 07/15/2014 9:32 AM Performed by: Jacquiline Doe A Pre-anesthesia Checklist: Patient identified, Emergency Drugs available, Suction available, Patient being monitored and Timeout performed Patient Re-evaluated:Patient Re-evaluated prior to inductionOxygen Delivery Method: Circle system utilized Preoxygenation: Pre-oxygenation with 100% oxygen Intubation Type: IV induction and Cricoid Pressure applied Ventilation: Mask ventilation without difficulty and Oral airway inserted - appropriate to patient size Laryngoscope Size: Mac and 4 Grade View: Grade II Tube type: Subglottic suction tube Tube size: 7.5 mm Number of attempts: 1 Airway Equipment and Method: Stylet Placement Confirmation: ETT inserted through vocal cords under direct vision,  positive ETCO2 and breath sounds checked- equal and bilateral Secured at: 24 cm Tube secured with: Tape Dental Injury: Teeth and Oropharynx as per pre-operative assessment

## 2014-07-15 NOTE — Progress Notes (Signed)
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 performed today in IR with Dr Sallyanne Havers       S/P lt common carotid and Lt vertebral artery and Lt ascending cervical branch of thyrocervical trunk followed by embolization of 4 lt ECA arterial feeders supplying the large fast flow fistula using liquid ONYX 18 liquid embolic material     After procedure pt developed left facial droop Numbness CT scan revealed tiny L posterior frontal subcortical hemorrhage Anticoagulation stopped  Recheck at 400 pm: much better Facial weakness and droop much less Feels better Wife and mother in room  Allergies: Contrast media and Shellfish allergy  Medications: Prior to Admission medications   Medication Sig Start Date End Date Taking? Authorizing Provider  amLODipine (NORVASC) 10 MG tablet Take 10 mg by mouth daily before breakfast.    Yes Historical Provider, MD  aspirin EC 81 MG tablet Take 81 mg by mouth daily.   Yes Historical Provider, MD  EPINEPHrine 0.3 mg/0.3 mL IJ SOAJ injection Inject 0.3 mg into the muscle as needed (emergent allergic reaction).   Yes Historical Provider, MD  fenofibrate micronized (ANTARA) 130 MG capsule Take 130 mg by mouth daily before breakfast.   Yes Historical Provider, MD  HYDROcodone-acetaminophen (NORCO/VICODIN) 5-325 MG per tablet Take 1 tablet by mouth every 6 (six) hours as needed for moderate pain.   Yes Historical Provider, MD  ibuprofen (ADVIL,MOTRIN) 200 MG tablet Take 400 mg by mouth every 6 (six) hours as needed for mild pain or moderate pain.   Yes Historical Provider, MD  metoprolol succinate (TOPROL-XL) 25 MG 24 hr tablet Take 25 mg by mouth daily.   Yes Historical Provider, MD  valsartan-hydrochlorothiazide (DIOVAN-HCT) 320-25 MG per tablet Take 1 tablet by mouth daily before breakfast.    Yes Historical Provider, MD  albuterol (PROVENTIL HFA;VENTOLIN HFA) 108 (90 BASE) MCG/ACT inhaler  Inhale 2 puffs into the lungs every 4 (four) hours as needed for wheezing or shortness of breath.    Historical Provider, MD  cyclobenzaprine (FLEXERIL) 10 MG tablet Take 1 tablet (10 mg total) by mouth 3 (three) times daily as needed for muscle spasms. Patient not taking: Reported on 04/22/2014 12/22/13   Ashok Pall, MD  oxyCODONE-acetaminophen (PERCOCET/ROXICET) 5-325 MG per tablet Take 1-2 tablets by mouth every 4 (four) hours as needed for severe pain. Patient not taking: Reported on 04/22/2014 02/03/14   Leota Jacobsen, MD  tamsulosin (FLOMAX) 0.4 MG CAPS capsule Take 1 capsule (0.4 mg total) by mouth daily. Patient not taking: Reported on 04/22/2014 02/03/14   Leota Jacobsen, MD     Vital Signs: BP 117/79 mmHg  Pulse 84  Temp(Src) 97.9 F (36.6 C) (Oral)  Resp 21  Ht 5\' 10"  (1.778 m)  Wt 135 kg (297 lb 9.9 oz)  BMI 42.70 kg/m2  SpO2 96%  Physical Exam  Constitutional: He is oriented to person, place, and time.  HENT:  L facial droop much less yet still appreciable Can tell left eye can squeeze with less tightness Smile still with evident slight droop at left Left ear redness and seems swollen Left face with better sensation than after procedure  Eyes: EOM are normal.  Neck: Neck supple.  Musculoskeletal: Normal range of motion.  Good strength B of arms/hands; feet Good sensation B extr  Neurological: He is alert and oriented to person, place, and time.    Imaging: Ct Head Wo Contrast  07/15/2014   CLINICAL **Note De-Identified Derek Donaldson** DATA:  Left facial droop status post embolization of arteriovenous malformation.  EXAM: CT HEAD WITHOUT CONTRAST  TECHNIQUE: Contiguous axial images were obtained from the base of the skull through the vertex without intravenous contrast.  COMPARISON:  Brain MRI 11/14/2013  FINDINGS: There is prominent streak artifact related to the embolization material in multiple vessels of the left skull base. This partially obscures portions of the middle and posterior cranial fossa.  Small curvilinear hyperdensity in the posterior right frontal and corresponds to the vascular anomaly described on prior MRI. No gross, acute large territory infarct is identified. No acute intracranial hemorrhage, mass, midline shift, or extra-axial fluid collection is seen. Ventricles and sulci are within normal limits.  Orbits are unremarkable. Mastoid air cells are clear. There is mild mucosal thickening in the ethmoid air cells and maxillary sinuses bilaterally.  IMPRESSION: 1. No definite acute intracranial abnormality identified. Prominent streak artifact related to liquid embolization material. 2. Small curvilinear hyperdensity in the posterior right frontal lobe. 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 Acute hemorrhage is felt unlikely. Venous thrombosis is not excluded. These results were called by telephone at the time of interpretation on 07/15/2014 at 2:03 pm to Dr. Leonel Ramsay, who verbally acknowledged these results.   Electronically Signed   By: Logan Bores   On: 07/15/2014 14:10    Labs:  CBC:  Recent Labs  06/03/14 0917 06/06/14 0500 07/01/14 1015 07/15/14 1515  WBC 7.2 12.8* 6.7 16.2*  HGB 15.0 12.6* 14.4 12.7*  HCT 44.1 38.1* 42.6 37.6*  PLT 269 228 248 255    COAGS:  Recent Labs  01/11/14 0656 02/14/14 1223 04/23/14 0930 06/03/14 0917 07/15/14 0832 07/15/14 1515  INR 1.06 1.00 0.99 1.05 1.02 1.08  APTT 28 23* 28 29  --   --     BMP:  Recent Labs  04/23/14 0930 06/03/14 0917 06/06/14 0500 07/01/14 1015  NA 143 137 137 142  K 3.8 3.1* 3.3* 3.5  CL 104 103 106 107  CO2 25 29 24 30   GLUCOSE 95 139* 127* 121*  BUN 10 8 8 12   CALCIUM 9.9 9.7 8.4 9.7  CREATININE 0.75 0.86 0.70 0.85  GFRNONAA >90 >90 >90 >90  GFRAA >90 >90 >90 >90    LIVER FUNCTION TESTS:  Recent Labs  02/14/14 1223 04/23/14 0930  06/03/14 0917 07/01/14 1015  BILITOT 0.3 0.3 0.7 0.5  AST 18 17 31 24   ALT 23 28 41 26  ALKPHOS 44 46 48 45  PROT 8.1 7.6 7.3 7.1  ALBUMIN 4.4 4.3 4.3 4.0    Assessment and Plan:  Dural AVF embolization with Onyx 4 L ECA arterial feeders embolized Post procedure L facial droop Much better now Dr Estanislado Pandy has examined pt Will probably stay 2 nights in ICU Plan with Stroke team  Signed: Jaiden Wahab A 07/15/2014, 3:56 PM   I spent a total of 15 Minutes in face to face in clinical consultation/evaluation, greater than 50% of which was counseling/coordinating care for Dural AVF embolization

## 2014-07-15 NOTE — Progress Notes (Signed)
**Note De-Identified Hairston Obfuscation** Patient ID: Derek Donaldson, male   DOB: 1970-11-29, 44 y.o.   MRN: [id number]  Post embolization patient extubated with out much difficulty. On exam found to have lt facial droop, and ?minimal lt UE  hand grip weaknesss. Denies any N/V  Or H/A.  BP 160s 80s.hoarseness 90s to 100 SR PAO2 > 95% . Patient able to answer simple questions clearly. Comprehension WNL. PERLA 2.48mm  Rt = Lt .EOMS full No nystagmus or subjective diplopua. LT facial droop,with wrinkles over forehead. Tongue midline. Motor No pronation drift. ?lhand grip 5-/5.Marland Kitchen Code stroke called . Patient taken to CT . CT reveals a small <1cm,RT post frontal subcortical linear hemorrhage. Plan Will control BP strictly  Between,183mmhg and 130 mmhg. Other orders as per stroke service. D/Wpatient and family.

## 2014-07-15 NOTE — Procedures (Signed)
**Note De-Identified Koska Obfuscation** S/P lt common carotid and Lt vertebral artery and Lt ascending cervical branch of thyrocervical trunk followed by embolization of 4 lt  ECA arterial feeders supplying the large fast flow fistula using liquid ONYX 18 liquid embolic  material

## 2014-07-15 NOTE — Progress Notes (Signed)
**Note De-Identified Stormer Obfuscation** Called Dr. Massage regarding BP cuff vs Aline. Orders received to go by cuff pressures. Will cont to monitor.

## 2014-07-15 NOTE — Progress Notes (Signed)
**Note De-Identified Slezak Obfuscation** Spoke with Dr.Deveshwar to update on pt status. Orders received to keep systolic [phone]. Will cont to monitor.

## 2014-07-15 NOTE — Transfer of Care (Signed)
**Note De-Identified Veras Obfuscation** Immediate Anesthesia Transfer of Care Note  Patient: Derek Donaldson  Procedure(s) Performed: Procedure(s): RADIOLOGY WITH ANESTHESIA/EMBOLIZATION (N/A)  Patient Location: NICU  Anesthesia Type:General  Level of Consciousness: oriented, sedated, patient cooperative and responds to stimulation  Airway & Oxygen Therapy: Patient Spontanous Breathing and Patient connected to face mask oxygen  Post-op Assessment: Report given to RN, Post -op Vital signs reviewed and stable, Patient moving all extremities, Patient moving all extremities X 4 and Patient able to stick tongue midline  Post vital signs: Reviewed and stable  Last Vitals:  Filed Vitals:   07/15/14 1400  BP:   Pulse:   Temp: 36.6 C  Resp:     Complications: No apparent anesthesia complications

## 2014-07-15 NOTE — Anesthesia Postprocedure Evaluation (Signed)
**Note De-identified Felter Obfuscation**  **Note De-Identified Polgar Obfuscation** Anesthesia Post-op Note  Patient: Derek Donaldson  Procedure(s) Performed: Procedure(s): RADIOLOGY WITH ANESTHESIA/EMBOLIZATION (N/A)  Patient Location: NICU  Anesthesia Type:General  Level of Consciousness: awake, sedated and responds to stimulation  Airway and Oxygen Therapy: Patient Spontanous Breathing  Post-op Pain: mild  Post-op Assessment: Post-op Vital signs reviewed, Patient's Cardiovascular Status Stable and RESPIRATORY FUNCTION UNSTABLE  Post-op Vital Signs: stable  Last Vitals:  Filed Vitals:   07/15/14 1428  BP:   Pulse: 87  Temp:   Resp: 21    Complications: No apparent anesthesia complications

## 2014-07-16 ENCOUNTER — Inpatient Hospital Stay (HOSPITAL_COMMUNITY): Payer: BLUE CROSS/BLUE SHIELD

## 2014-07-16 ENCOUNTER — Encounter (HOSPITAL_COMMUNITY): Payer: Self-pay | Admitting: Interventional Radiology

## 2014-07-16 ENCOUNTER — Other Ambulatory Visit: Payer: Self-pay | Admitting: Radiology

## 2014-07-16 DIAGNOSIS — I671 Cerebral aneurysm, nonruptured: Secondary | ICD-10-CM

## 2014-07-16 DIAGNOSIS — D72829 Elevated white blood cell count, unspecified: Secondary | ICD-10-CM | POA: Insufficient documentation

## 2014-07-16 DIAGNOSIS — G510 Bell's palsy: Secondary | ICD-10-CM | POA: Insufficient documentation

## 2014-07-16 LAB — CBC WITH DIFFERENTIAL/PLATELET
Basophils Absolute: 0 10*3/uL (ref 0.0–0.1)
Basophils Relative: 0 % (ref 0–1)
EOS PCT: 0 % (ref 0–5)
Eosinophils Absolute: 0 10*3/uL (ref 0.0–0.7)
HEMATOCRIT: 36 % — AB (ref 39.0–52.0)
Hemoglobin: 12 g/dL — ABNORMAL LOW (ref 13.0–17.0)
LYMPHS ABS: 1.2 10*3/uL (ref 0.7–4.0)
Lymphocytes Relative: 8 % — ABNORMAL LOW (ref 12–46)
MCH: 29.6 pg (ref 26.0–34.0)
MCHC: 33.3 g/dL (ref 30.0–36.0)
MCV: 88.7 fL (ref 78.0–100.0)
MONOS PCT: 7 % (ref 3–12)
Monocytes Absolute: 1.1 10*3/uL — ABNORMAL HIGH (ref 0.1–1.0)
NEUTROS PCT: 85 % — AB (ref 43–77)
Neutro Abs: 12.7 10*3/uL — ABNORMAL HIGH (ref 1.7–7.7)
Platelets: 248 10*3/uL (ref 150–400)
RBC: 4.06 MIL/uL — ABNORMAL LOW (ref 4.22–5.81)
RDW: 13.9 % (ref 11.5–15.5)
WBC: 15 10*3/uL — ABNORMAL HIGH (ref 4.0–10.5)

## 2014-07-16 LAB — BASIC METABOLIC PANEL
ANION GAP: 7 (ref 5–15)
BUN: 9 mg/dL (ref 6–23)
CALCIUM: 8 mg/dL — AB (ref 8.4–10.5)
CO2: 25 mmol/L (ref 19–32)
Chloride: 105 mmol/L (ref 96–112)
Creatinine, Ser: 0.77 mg/dL (ref 0.50–1.35)
GFR calc Af Amer: >90 mL/min (ref >90)
Glucose, Bld: 124 mg/dL — ABNORMAL HIGH (ref 70–99)
Potassium: 3.1 mmol/L — ABNORMAL LOW (ref 3.5–5.1)
Sodium: 137 mmol/L (ref 135–145)

## 2014-07-16 LAB — GLUCOSE, CAPILLARY
Glucose-Capillary: 110 mg/dL — ABNORMAL HIGH (ref 70–99)
Glucose-Capillary: 114 mg/dL — ABNORMAL HIGH (ref 70–99)

## 2014-07-16 LAB — URINALYSIS W MICROSCOPIC (NOT AT ARMC)
Bilirubin Urine: NEGATIVE
Glucose, UA: NEGATIVE mg/dL
HGB URINE DIPSTICK: NEGATIVE
KETONES UR: NEGATIVE mg/dL
Leukocytes, UA: NEGATIVE
Nitrite: NEGATIVE
PROTEIN: NEGATIVE mg/dL
Specific Gravity, Urine: 1.033 — ABNORMAL HIGH (ref 1.005–1.030)
UROBILINOGEN UA: 0.2 mg/dL (ref 0.0–1.0)
pH: 5.5 (ref 5.0–8.0)

## 2014-07-16 LAB — LIPID PANEL
Cholesterol: 141 mg/dL (ref 0–200)
HDL: 32 mg/dL — ABNORMAL LOW (ref >39)
LDL Cholesterol: 90 mg/dL (ref 0–99)
TRIGLYCERIDES: 94 mg/dL (ref <150)
Total CHOL/HDL Ratio: 4.4 RATIO
VLDL: 19 mg/dL (ref 0–40)

## 2014-07-16 MED ORDER — PANTOPRAZOLE SODIUM 40 MG PO TBEC: 40.0000 mg | DELAYED_RELEASE_TABLET | Freq: Every day | ORAL | Status: DC

## 2014-07-16 MED ORDER — POLYVINYL ALCOHOL 1.4 % OP SOLN
1.0000 [drp] | OPHTHALMIC | Status: DC
  Filled 2014-07-16: qty 15

## 2014-07-16 MED ORDER — POTASSIUM CHLORIDE CRYS ER 20 MEQ PO TBCR
40.0000 meq | EXTENDED_RELEASE_TABLET | ORAL | Status: AC
  Administered 2014-07-16 (×2): 40 meq via ORAL
  Filled 2014-07-16 (×2): qty 2

## 2014-07-16 NOTE — Discharge Summary (Signed)
**Note De-Identified Vandervort Obfuscation** Physician Discharge Summary  Patient ID: Derek Donaldson MRN: [id number] DOB/AGE: [birth year] 44 y.o.  Admit date: 07/15/2014 Discharge date: 07/16/2014  Admission Diagnoses: Active Problems:   Dural arteriovenous fistula   Discharge Diagnoses:  Active Problems:   Dural arteriovenous fistula     Procedures: Procedure(s): S/P lt common carotid and Lt vertebral artery and Lt ascending cervical branch of thyrocervical trunk followed by embolization of 4 lt ECA arterial feeders supplying the large fast flow fistula using liquid ONYX 18 liquid embolic material.   Discharged Condition: good  Hospital Course: Pt brought to IR suite and after GET anesthesia initiated, pt underwent successful completion of staged (L)dural AV fistula with ONYX material.  At the end of the procedure, the pt was noted to have significant left facial droop and difficulty closing right eyelid. Neurology team consulted. An immediate CT scan suggested possible hemorrhage related to the procedure. He was admitted to the Neuro ICU. His symptoms improved throughout the day. MR was ordered, which showed no acute hemorrhage, but a stable chronic finding. Post procedure WBC was elevated, no infectious issues found. WBC trended down on POD #1. The patients symptoms and exam on POD #1 are markedly improved and nearly resolved back to baseline. He has been evaluated by Neurology and with the MRI findings, it is felt that the acute neurologic deficit is likely related to nerve irritation secondary to the procedure and is self-limiting. He is determined to be stable for discharge at this time. All instructions, medications, and follow up plans reviewed with the pt and his family.  Consults: neurology   Discharge Exam: Blood pressure 124/73, pulse 79, temperature 97.9 F (36.6 C), temperature source Oral, resp. rate 15, height 5\' 10"  (1.778 m), weight 297 lb 9.9 oz (135 kg), SpO2 93 %.  Constitutional: He is oriented to  person, place, and time.  HENT:  Left eyelid closure strength minimally weaker than right Smile still with evident slight droop at left Minimal if any left ear edema or erythema Left face with better sensation than after procedure  Eyes: EOMI are normal. PERRLA, peripheral fields intact Neck: Neck supple.  Musculoskeletal: Normal range of motion.  Good strength B of arms/hands;normal fine motor with no delay  Good sensation B lower extr  (R)groin soft, no hematoma, dressing clean  Disposition: 01-Home or Self Care  Discharge Instructions    Call MD for:  difficulty breathing, headache or visual disturbances    Complete by:  As directed      Call MD for:  persistant nausea and vomiting    Complete by:  As directed      Call MD for:  redness, tenderness, or signs of infection (pain, swelling, redness, odor or green/yellow discharge around incision site)    Complete by:  As directed      Call MD for:  severe uncontrolled pain    Complete by:  As directed      Call MD for:  temperature >100.4    Complete by:  As directed      Diet - low sodium heart healthy    Complete by:  As directed      Driving Restrictions    Complete by:  As directed   Avoid driving for a few days     Increase activity slowly    Complete by:  As directed      May shower / Bathe    Complete by:  As directed      May walk up **Note De-Identified Frary Obfuscation** steps    Complete by:  As directed      No dressing needed    Complete by:  As directed   May remove bandage at home and shower.            Medication List    STOP taking these medications        HYDROcodone-acetaminophen 5-325 MG per tablet  Commonly known as:  NORCO/VICODIN      TAKE these medications        albuterol 108 (90 BASE) MCG/ACT inhaler  Commonly known as:  PROVENTIL HFA;VENTOLIN HFA  Inhale 2 puffs into the lungs every 4 (four) hours as needed for wheezing or shortness of breath.     amLODipine 10 MG tablet  Commonly known as:  NORVASC  Take 10 mg by  mouth daily before breakfast.     ANTARA 130 MG capsule  Generic drug:  fenofibrate micronized  Take 130 mg by mouth daily before breakfast.     aspirin EC 81 MG tablet  Take 81 mg by mouth daily.     cyclobenzaprine 10 MG tablet  Commonly known as:  FLEXERIL  Take 1 tablet (10 mg total) by mouth 3 (three) times daily as needed for muscle spasms.     EPINEPHrine 0.3 mg/0.3 mL Soaj injection  Commonly known as:  EPI-PEN  Inject 0.3 mg into the muscle as needed (emergent allergic reaction).     ibuprofen 200 MG tablet  Commonly known as:  ADVIL,MOTRIN  Take 400 mg by mouth every 6 (six) hours as needed for mild pain or moderate pain.     metoprolol succinate 25 MG 24 hr tablet  Commonly known as:  TOPROL-XL  Take 25 mg by mouth daily.     oxyCODONE-acetaminophen 5-325 MG per tablet  Commonly known as:  PERCOCET/ROXICET  Take 1-2 tablets by mouth every 4 (four) hours as needed for severe pain.     tamsulosin 0.4 MG Caps capsule  Commonly known as:  FLOMAX  Take 1 capsule (0.4 mg total) by mouth daily.     valsartan-hydrochlorothiazide 320-25 MG per tablet  Commonly known as:  DIOVAN-HCT  Take 1 tablet by mouth daily before breakfast.           Follow-up Information    Follow up with DEVESHWAR, Fritz Pickerel, MD. Schedule an appointment as soon as possible for a visit in 2 weeks.   Specialty:  Interventional Radiology   Why:  Office will call with appt time. Call [phone] with concerns   Contact information:   [address] [place] [address] [phone]       Signed: Ascencion Dike PA-C 07/16/2014, 12:05 PM

## 2014-07-16 NOTE — Progress Notes (Signed)
**Note De-identified Moder Obfuscation** UR completed.  Amadea Keagy, RN BSN [facility] CCM Trauma/Neuro ICU Case Manager [phone]  

## 2014-07-16 NOTE — Discharge Instructions (Signed)
**Note De-identified Croson Obfuscation** Cerebral Angiogram, Care After °Refer to this sheet in the next few weeks. These instructions provide you with information on caring for yourself after your procedure. Your health care provider may also give you more specific instructions. Your treatment has been planned according to current medical practices, but problems sometimes occur. Call your health care provider if you have any problems or questions after your procedure. °WHAT TO EXPECT AFTER THE PROCEDURE °After your procedure, it is typical to have the following:  °· Small lump at the insertion site. °· Tenderness or bruising at the insertion site. °· Mild headache. °HOME CARE INSTRUCTIONS °· Rest at home for the first day after your procedure. °· Do not drive or operate heavy machinery as directed by your health care provider. °· Do not exercise as directed by your health care provider. °· Do not lift objects more than 10 pounds as directed by your health care provider. °· Take medicines only as directed by your health care provider. °· Follow your health care provider's instructions on: °¨ Incision care. °¨ Bandage (dressing) changes and removal. °· Check daily for signs of infection at the insertion site, such as redness, swelling, or discharge. °· Drink enough water to keep your urine clear or pale yellow. This will help flush out the contrast dye. °· Keep all follow-up visits as directed by your health care provider. This is important. °SEEK MEDICAL CARE IF: °· You have a fever. °· The skin at the insertion site begins to separate. °· You have drainage, redness, swelling, or pain at the insertion site. °· You are dizzy. °· You have a rash. °· You are itchy. °· You have difficulty urinating. °SEEK IMMEDIATE MEDICAL CARE IF: °· You have weakness in the face, arms, or legs.   °· You have difficulty talking or swallowing.   °· You have vision problems.   °· You have sudden swelling or pain at the insertion site. °· You have a rash.   °· You have  difficulty using the arm or leg in which the catheter was inserted.   °· You have chest pain. °· You have difficulty breathing. °Document Released: 09/17/2013 Document Reviewed: 05/16/2013 °[employer]® Patient Information ©2015 [employer]. This information is not intended to replace advice given to you by your health care provider. Make sure you discuss any questions you have with your health care provider. ° °

## 2014-07-16 NOTE — Evaluation (Signed)
**Note De-Identified Felker Obfuscation** Speech Language Pathology Evaluation Patient Details Name: Emeterio Balke Telford MRN: [id number] DOB: December 23, 1970 Today's Date: 07/16/2014 Time: 7846-9629 SLP Time Calculation (min) (ACUTE ONLY): 10 min  Problem List:  Patient Active Problem List   Diagnosis Date Noted  . ICH (intracerebral hemorrhage) 07/15/2014  . Dural arteriovenous fistula   . AVF (arteriovenous fistula) 06/05/2014  . 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 Chiari malformation type I 12/21/2013  . Chronic cholecystitis with calculus s/p lap chole [id number] 04/03/2012  . Choledocholithiasis s/p ERCP [id number] 03/21/2012  . GERD (gastroesophageal reflux disease)   . Essential hypertension, benign 09/22/2011  . Mixed hyperlipidemia 09/22/2011   Past Medical History:  Past Medical History  Diagnosis Date  . Mixed hyperlipidemia     takes Fenofibrate daily  . Cancer 2010    skin ( unsure of the pathology)- removed  . Shortness of breath     with exertion  . Pneumonia 2005    hx of  . History of bronchitis   . Chiari malformation   . Numbness     left arm from chiari malformation  . History of kidney stones   . Essential hypertension, benign     takes Metoprolol,Diovan,and Amlodipine daily  . Muscle spasm     takes Flexeril daily as needed  . Asthma     Albuterol inhaler prn;just a small touch   Past Surgical History:  Past Surgical History  Procedure Laterality Date  . Stapedectomy  04/23/2003    Revision left stapedectomy  . Vasectomy    . Ercp  03/21/2012    Procedure: ENDOSCOPIC RETROGRADE CHOLANGIOPANCREATOGRAPHY (ERCP);  Surgeon: Jeryl Columbia, MD;  Location: [facility];  Service: Endoscopy;  Laterality: N/A;  aqntibiotics and type and screen  . Suboccipital craniectomy cervical laminectomy N/A 12/21/2013    Procedure: Aborted Cranioplasty For Chiari Malformation (Could not complete procedure);  Surgeon: Ashok Pall, MD;  Location: [facility];  Service: Neurosurgery;  Laterality: N/A;  Aborted  Cranioplasty For Chiari Malformation  . Cholecystectomy  11/13  . Mri      x 2  . Cerebral angiogram  12/2013  . Radiology with anesthesia N/A 02/20/2014    Procedure: RADIOLOGY WITH ANESTHESIA EMBOLIZATION;  Surgeon: Rob Hickman, MD;  Location: [facility];  Service: Radiology;  Laterality: N/A;  . Radiology with anesthesia N/A 04/29/2014    Procedure: STAGE TWO EMBOLIZATION;  Surgeon: Luanne Bras, MD;  Location: [place];  Service: Radiology;  Laterality: N/A;  . Radiology with anesthesia N/A 06/05/2014    Procedure: Embolization;  Surgeon: Rob Hickman, MD;  Location: [place];  Service: Radiology;  Laterality: N/A;  . Radiology with anesthesia N/A 07/15/2014    Procedure: RADIOLOGY WITH ANESTHESIA/EMBOLIZATION;  Surgeon: Rob Hickman, MD;  Location: [place];  Service: Radiology;  Laterality: N/A;   HPI:  44 y.o. male presenting to [facility] for elective  ECA arterial feeders supplying the large fast flow fistula. Post procedure he was noted to have a left facial droop from forehead to lower face. MRI 3/1 negative for acute ischemic infarction. Negative for intracranial hemorrhage.PMH:  asthma, chiari malformation, cancer, pna.   Assessment / Plan / Recommendation Clinical Impression  Pt. scored a 30/30 on the [facility] Cognitive Assessment). No speech-language-cognitive deficits noted per pt since event. No ST needed at this time.    SLP Assessment  Patient does not need any further Speech Lanaguage Pathology Services    Follow Up Recommendations  None    Frequency **Note De-Identified Anderegg Obfuscation** and Duration        Pertinent Vitals/Pain Pain Assessment: No/denies pain   SLP Goals     SLP Evaluation Prior Functioning  Cognitive/Linguistic Baseline: Within functional limits Type of Home: House Available Help at Discharge: Family Vocation:  (drives truck locally)   Cognition  Overall Cognitive Status: Within Functional Limits for tasks assessed Orientation Level: Oriented X4     Comprehension  Auditory Comprehension Overall Auditory Comprehension: Appears within functional limits for tasks assessed Visual Recognition/Discrimination Discrimination: Not tested Reading Comprehension Reading Status: Within funtional limits    Expression Expression Primary Mode of Expression: Verbal Written Expression Dominant Hand: Right Written Expression: Within Functional Limits   Oral / Motor Oral Motor/Sensory Function Overall Oral Motor/Sensory Function: Appears within functional limits for tasks assessed Motor Speech Overall Motor Speech: Appears within functional limits for tasks assessed   GO     Houston Siren 07/16/2014, 1:33 PM   Orbie Pyo Krisha Beegle M.Ed [employer] [phone]

## 2014-07-16 NOTE — Progress Notes (Signed)
**Note De-Identified Dade Obfuscation** OT Cancellation Note  Patient Details Name: Derek Donaldson MRN: [id number] DOB: [birth year]   Cancelled Treatment:    Reason Eval/Treat Not Completed: Medical issues which prohibited therapy Active bedrest orders. Please update activity orders when appropriate for therapy. Thanks [place], OTR/L  [phone] 07/16/2014 07/16/2014, 7:50 AM

## 2014-07-16 NOTE — Plan of Care (Signed)
**Note De-Identified Fodera Obfuscation** Problem: Progression Outcomes Goal: Tolerating diet/TF at goal rate Outcome: Completed/Met Date Met:  07/16/14 Regular diet.

## 2014-07-16 NOTE — Plan of Care (Signed)
**Note De-Identified Wandersee Obfuscation** Problem: Progression Outcomes Goal: Progressive activity as tolerated Outcome: Completed/Met Date Met:  07/16/14 Up to chair and walked the unit.

## 2014-07-16 NOTE — Progress Notes (Addendum)
**Note De-Identified Kriz Obfuscation** STROKE TEAM PROGRESS NOTE   HISTORY OF PRESENT ILLNESS Derek Donaldson is an 44 y.o. male presenting to [facility] for elective of 4 lt ECA arterial feeders supplying the large fast flow fistula using liquid ONYX. Post procedure after patient was awoken he was noted to have a left facial droop from forehead to lower face. Code stroke was initiated Derek patient was brought to CT. Small bleed was seen Derek patient was brought to ICU. Currently he is alert Derek able to follow all commands. He is in no distress Derek has no complaints.   Date last known well: Date: 07/15/2014 Time last known well: Time: 09:17 tPA Given: No: ICH  SUBJECTIVE (INTERVAL HISTORY) Derek Donaldson Derek Donaldson are at the bedside.  Overall he feels Derek condition is rapidly improving. Derek Donaldson stated that after procedure in the recovery room he had left lower facial weakness, left eye not able to firmly squeeze shut, but also left eye not able to fully open. Patient stated that he felt some numbness of Derek tongue. This morning right first he was not able to taste well, however that was Derek experience with previous embolization too.  OBJECTIVE Temp:  [97.9 F (36.6 C)-98.2 F (36.8 C)] 98.2 F (36.8 C) (03/01 1200) Pulse Rate:  [72-97] 80 (03/01 1200) Cardiac Rhythm:  [-] Normal sinus rhythm (03/01 0800) Resp:  [13-26] 18 (03/01 1200) BP: (107-140)/(67-93) 133/76 mmHg (03/01 1100) SpO2:  [91 %-97 %] 96 % (03/01 1200) Arterial Line BP: (94-193)/(69-95) 193/81 mmHg (03/01 0900)   Recent Labs Lab 07/15/14 1706 07/15/14 2204 07/16/14 0730 07/16/14 1207  GLUCAP 104* 123* 110* 114*    Recent Labs Lab 07/15/14 1515 07/16/14 0525  NA 137 137  K 3.5 3.1*  CL 109 105  CO2 23 25  GLUCOSE 127* 124*  BUN 8 9  CREATININE 0.79 0.77  CALCIUM 8.5 8.0*    Recent Labs Lab 07/15/14 1515  AST 20  ALT 26  ALKPHOS 41  BILITOT 0.6  PROT 6.4  ALBUMIN 3.6    Recent Labs Lab 07/15/14 1515 07/16/14 0525  WBC 16.2*  15.0*  NEUTROABS  --  12.7*  HGB 12.7* 12.0*  HCT 37.6* 36.0*  MCV 86.2 88.7  PLT 255 248   No results for input(s): CKTOTAL, CKMB, CKMBINDEX, TROPONINI in the last 168 hours.  Recent Labs  07/15/14 0832 07/15/14 1515  LABPROT 13.5 14.1  INR 1.02 1.08    Recent Labs  07/16/14 0935  COLORURINE YELLOW  LABSPEC 1.033*  PHURINE 5.5  GLUCOSEU NEGATIVE  HGBUR NEGATIVE  BILIRUBINUR NEGATIVE  KETONESUR NEGATIVE  PROTEINUR NEGATIVE  UROBILINOGEN 0.2  NITRITE NEGATIVE  LEUKOCYTESUR NEGATIVE       Component Value Date/Time   CHOL 141 07/16/2014 0525   TRIG 94 07/16/2014 0525   HDL 32* 07/16/2014 0525   CHOLHDL 4.4 07/16/2014 0525   VLDL 19 07/16/2014 0525   LDLCALC 90 07/16/2014 0525   No results found for: HGBA1C No results found for: LABOPIA, COCAINSCRNUR, LABBENZ, AMPHETMU, THCU, LABBARB  No results for input(s): ETH in the last 168 hours.  I have personally reviewed the radiological images below Derek agree with the radiology interpretations.  Ct Head Wo Contrast  07/15/2014   IMPRESSION: 1. No definite acute intracranial abnormality identified. Prominent streak artifact related to liquid embolization material. 2. Small curvilinear hyperdensity in the posterior right frontal lobe. No prior CT is available for comparison, however this is felt to reflect the underlying vascular abnormality (likely **Note De-Identified Poulson Obfuscation** prominent developmental venous anomaly) seen on the prior MRI. Acute hemorrhage is felt unlikely. Venous thrombosis is not excluded.    Mr Brain Derek Mrv Wo Contrast  07/16/2014   IMPRESSION: Negative for acute infarct.  Occlusion of the left transverse sinus Derek a portion of the sigmoid sinus likely related to glue embolization. Chronicity of this occlusion is not determined.  Negative for acute infarct.  No acute hemorrhage. 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  Chiari malformation with cervical  cord syrinx.    Dg Chest Port 1 View  07/16/2014    IMPRESSION: No acute cardiopulmonary abnormality seen.     PHYSICAL EXAM  Temp:  [97.9 F (36.6 C)-98.2 F (36.8 C)] 98.2 F (36.8 C) (03/01 1200) Pulse Rate:  [72-97] 80 (03/01 1200) Resp:  [13-26] 18 (03/01 1200) BP: (107-140)/(67-93) 133/76 mmHg (03/01 1100) SpO2:  [91 %-97 %] 96 % (03/01 1200) Arterial Line BP: (94-193)/(69-95) 193/81 mmHg (03/01 0900)  General - Well nourished, well developed, in no apparent distress.  Ophthalmologic - Sharp disc margins OU. Patient felt dry eyes on the left.  Cardiovascular - Regular rate Derek rhythm with no murmur.  Neck - supple, no carotid bruits  Mental Status -  Level of arousal Derek orientation to time, place, Derek person were intact. Language including expression, naming, repetition, comprehension was assessed Derek found intact. Attention span Derek concentration were normal. Recent Derek remote memory were intact. Fund of Knowledge was assessed Derek was intact.  Cranial Nerves II - XII - II - Visual field intact OU.  III, IV, VI - Extraocular movements intact. V - Facial sensation intact bilaterally. VII - Lower facial movement symmetrical laterally, however decreased left eye closure Derek wrinkles on the left forehead. Taste was not tested. VIII - Hearing increased on the left than the right & vestibular intact bilaterally. X - Palate elevates symmetrically. XI - Chin turning & shoulder shrug intact bilaterally. XII - Tongue protrusion intact.  Motor Strength - The patient's strength was normal in all extremities Derek pronator drift was absent.  Bulk was normal Derek fasciculations were absent.   Motor Tone - Muscle tone was assessed at the neck Derek appendages Derek was normal.  Reflexes - The patient's reflexes were symmetrical in all extremities Derek he had no pathological reflexes.  Sensory - Light touch, temperature/pinprick were assessed Derek were symmetrical.    Coordination -  The patient had normal movements in the hands Derek feet with no ataxia or dysmetria.  Tremor was absent.  Gait Derek Station - deferred due to safety.   ASSESSMENT/PLAN Mr. Derek Donaldson is a 44 y.o. male with history of dural AV fistula state post stage III embolization admitted for left facial droop right operative procedure. Symptoms much improved.    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 Patient had features of peripheral CN VII palsy with left facial droop, decreased eye closing, left dry eyes, left a year increased hearing, decreased taste. Symptoms improved rapidly. Likely related to procedure with local tissue swelling compressing CN VII.   MRI  negative for infarct or hemorrhage  MRV  Occlusion of the left transverse sinus Derek a portion of the sigmoid sinus likely related to glue embolization.  CT right frontal likely small DVA   Diet Heart  Diet - low sodium heart healthy   aspirin 81 mg orally every day prior to admission, now on aspirin 81 **Note De-Identified Anschutz Obfuscation** mg orally every day  Patient counseled to be compliant with Derek antithrombotic medications  Ongoing aggressive stroke risk factor management  Therapy recommendations:  Home  Disposition:  Home  Patient will be discharged today  Hypertension  Home meds:   Amlodipine, metoprolol, losartan, HCTZ  Stable  Resume home medication on discharge  Patient counseled to be compliant with Derek blood pressure medications  Other Active Problems  Hypokalemia - supplement  Other Pertinent History  Dural AV fistula s/p previous glue embolization  Hospital day # 1  I have personally obtained the history, examined the patient, evaluated laboratory data, individually viewed imaging studies Derek agree with radiology interpretations. I also obtained additional history from pt's Donaldson at bedside. MRI did not show hemorrhage, patient neuro stable, CN VII palsy much improved. Patient will be discharged today. Follow up with Dr.  Estanislado Pandy in 2 weeks.   Rosalin Hawking, MD PhD Stroke Neurology 07/16/2014 4:07 PM    To contact Stroke Continuity provider, please refer to [email or web address] After hours, contact General Neurology

## 2014-07-16 NOTE — Evaluation (Signed)
**Note De-Identified Araki Obfuscation** Physical Therapy Evaluation Patient Details Name: Derek Donaldson MRN: [id number] DOB: 1970/08/02 Today's Date: 07/16/2014   History of Present Illness  44 y.o. male presenting to [facility] for elective of 4 lt ECA arterial feeders supplying the large fast flow fistula using liquid ONYX. Post procedure after patient was awoken he was noted to have a left facial droop from forehead to lower face. Code stroke was initiated and patient was brought to CT. Small bleed was seen and patient was brought to ICU.  Clinical Impression  Patient seen for mobility evaluation, ambulating well, strength and mobility WFL.  Patient independent with no significant deficits noted. Educated on mobility expectations and safety upon discharge. No further acute PT needs, will sign off.     Follow Up Recommendations No PT follow up    Equipment Recommendations  None recommended by PT    Recommendations for Other Services       Precautions / Restrictions        Mobility  Bed Mobility Overal bed mobility: Modified Independent             General bed mobility comments: increased time to perform  Transfers Overall transfer level: Independent Equipment used: None             General transfer comment: no physical assist required  Ambulation/Gait Ambulation/Gait assistance: Independent Ambulation Distance (Feet): 180 Feet Assistive device: None Gait Pattern/deviations: WFL(Within Functional Limits) Gait velocity: modestly decreased Gait velocity interpretation: Below normal speed for age/gender General Gait Details: steady with ambulation despite decreased cadence  Stairs            Wheelchair Mobility    Modified Rankin (Stroke Patients Only) Modified Rankin (Stroke Patients Only) Pre-Morbid Rankin Score: No symptoms Modified Rankin: No significant disability     Balance Overall balance assessment: Independent;No apparent balance deficits (not formally assessed)                                            Pertinent Vitals/Pain Pain Assessment: No/denies pain    Home Living Family/patient expects to be discharged to:: Private residence Living Arrangements: Spouse/significant other Available Help at Discharge: Family Type of Home: House Home Access: Stairs to enter Entrance Stairs-Rails: None Technical brewer of Steps: 3 Home Layout: One level Home Equipment: None      Prior Function Level of Independence: Independent               Hand Dominance   Dominant Hand: Right    Extremity/Trunk Assessment               Lower Extremity Assessment: Overall WFL for tasks assessed         Communication   Communication: HOH (low tone hear loss)  Cognition Arousal/Alertness: Awake/alert Behavior During Therapy: WFL for tasks assessed/performed;Flat affect Overall Cognitive Status: Within Functional Limits for tasks assessed                      General Comments General comments (skin integrity, edema, etc.): some blood on bandage at left wrist site    Exercises        Assessment/Plan    PT Assessment Patent does not need any further PT services  PT Diagnosis Difficulty walking   PT Problem List    PT Treatment Interventions     PT Goals (Current goals can be **Note De-Identified Robertshaw Obfuscation** found in the Care Plan section) Acute Rehab PT Goals Patient Stated Goal: to go home PT Goal Formulation: All assessment and education complete, DC therapy    Frequency     Barriers to discharge        Co-evaluation               End of Session Equipment Utilized During Treatment: Gait belt Activity Tolerance: Patient tolerated treatment well Patient left: in chair;with call bell/phone within reach;with family/visitor present Nurse Communication: Mobility status         Time: 3735-7897 PT Time Calculation (min) (ACUTE ONLY): 20 min   Charges:   PT Evaluation $Initial PT Evaluation Tier I: 1 Procedure     PT  G CodesDuncan Dull 07-24-2014, 10:58 AM Alben Deeds, PT DPT  [phone]

## 2014-07-16 NOTE — Progress Notes (Signed)
**Note De-Identified Cesaro Obfuscation** Subjective: Pt feeling much better this am. Denies new sxs.  No headache, visual changes, N/V Denies groin or extremity pain  Objective: Physical Exam: BP 111/70 mmHg  Pulse 79  Temp(Src) 97.9 F (36.6 C) (Oral)  Resp 15  Ht 5\' 10"  (1.778 m)  Wt 297 lb 9.9 oz (135 kg)  BMI 42.70 kg/m2  SpO2 93% Constitutional: He is oriented to person, place, and time.  HENT:  Left eyelid closure strength equal to right Smile still with evident slight droop at left Minimal if any left ear edema or erythema Left face with better sensation than after procedure  Eyes: EOMI are normal. PERRLA, peripheral fields intact Neck: Neck supple.  Musculoskeletal: Normal range of motion.  Good strength B of arms/hands;normal fine motor with no delay  Good sensation B lower extr  (R)groin sift, no hematoma, dressing clean     Labs: CBC  Recent Labs  07/15/14 1515 07/16/14 0525  WBC 16.2* 15.0*  HGB 12.7* 12.0*  HCT 37.6* 36.0*  PLT 255 248   BMET  Recent Labs  07/15/14 1515 07/16/14 0525  NA 137 137  K 3.5 3.1*  CL 109 105  CO2 23 25  GLUCOSE 127* 124*  BUN 8 9  CREATININE 0.79 0.77  CALCIUM 8.5 8.0*   LFT  Recent Labs  07/15/14 1515  PROT 6.4  ALBUMIN 3.6  AST 20  ALT 26  ALKPHOS 41  BILITOT 0.6   PT/INR  Recent Labs  07/15/14 0832 07/15/14 1515  LABPROT 13.5 14.1  INR 1.02 1.08     Studies/Results: Ct Head Wo Contrast  07/15/2014   CLINICAL DATA:  Left facial droop status post embolization of arteriovenous malformation.  EXAM: CT HEAD WITHOUT CONTRAST  TECHNIQUE: Contiguous axial images were obtained from the base of the skull through the vertex without intravenous contrast.  COMPARISON:  Brain MRI 11/14/2013  FINDINGS: There is prominent streak artifact related to the embolization material in multiple vessels of the left skull base. This partially obscures portions of the middle and posterior cranial fossa. Small curvilinear hyperdensity in the posterior right  frontal and corresponds to the vascular anomaly described on prior MRI. No gross, acute large territory infarct is identified. No acute intracranial hemorrhage, mass, midline shift, or extra-axial fluid collection is seen. Ventricles and sulci are within normal limits.  Orbits are unremarkable. Mastoid air cells are clear. There is mild mucosal thickening in the ethmoid air cells and maxillary sinuses bilaterally.  IMPRESSION: 1. No definite acute intracranial abnormality identified. Prominent streak artifact related to liquid embolization material. 2. Small curvilinear hyperdensity in the posterior right frontal lobe. 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 Acute hemorrhage is felt unlikely. Venous thrombosis is not excluded. These results were called by telephone at the time of interpretation on 07/15/2014 at 2:03 pm to Dr. Leonel Ramsay, who verbally acknowledged these results.   Electronically Signed   By: Logan Bores   On: 07/15/2014 14:10    Assessment/Plan: Dural AVF embolization with Onyx 4 L ECA arterial feeders embolized Post procedure L facial droop, poss small subcortical hemorrhage MRI complete, Dr. Estanislado Pandy to review Symptoms markedly improved from immediate post procedure Dr. Estanislado Pandy to see pt    LOS: 1 day    Ascencion Dike PA-C 07/16/2014 8:04 AM

## 2014-08-20 ENCOUNTER — Ambulatory Visit (HOSPITAL_COMMUNITY)
Admission: RE | Admit: 2014-08-20 | Discharge: 2014-08-20 | Disposition: A | Discharge status: Home / Self Care | Payer: BLUE CROSS/BLUE SHIELD | Source: Ambulatory Visit | Attending: Radiology | Admitting: Radiology

## 2014-08-20 DIAGNOSIS — I671 Cerebral aneurysm, nonruptured: Secondary | ICD-10-CM

## 2014-09-06 ENCOUNTER — Telehealth: Payer: Self-pay | Admitting: Nurse Practitioner

## 2014-09-06 NOTE — Telephone Encounter (Signed)
**Note De-Identified Levengood Obfuscation** Cathy spoke with [place] at [facility]

## 2015-03-19 ENCOUNTER — Telehealth (HOSPITAL_COMMUNITY): Payer: Self-pay | Admitting: *Deleted

## 2015-03-19 ENCOUNTER — Telehealth (HOSPITAL_COMMUNITY): Payer: Self-pay | Admitting: Interventional Radiology

## 2015-03-19 ENCOUNTER — Other Ambulatory Visit (HOSPITAL_COMMUNITY): Payer: Self-pay | Admitting: Interventional Radiology

## 2015-03-19 DIAGNOSIS — I671 Cerebral aneurysm, nonruptured: Secondary | ICD-10-CM

## 2015-03-19 DIAGNOSIS — G935 Compression of brain: Secondary | ICD-10-CM

## 2015-03-19 NOTE — Telephone Encounter (Signed)
**Note De-Identified Scibilia Obfuscation** Called pt and spoke to his wife concerning his next follow-up procedure. She gave me his work phone and I told her I would call him directly. JM

## 2015-03-19 NOTE — Telephone Encounter (Signed)
**Note De-Identified Grosvenor Obfuscation** Called pt and spoke to him about his f/u MRI/MRA. The MRI was approve but the MRA was denied by his insurance. Dr. Estanislado Pandy would like to do a cerebral angiogram instead and the pt agrees to do this. We scheduled his procedure. JM

## 2015-03-27 ENCOUNTER — Other Ambulatory Visit: Payer: Self-pay | Admitting: Radiology

## 2015-03-27 NOTE — Telephone Encounter (Signed)
**Note De-Identified Ogarro Obfuscation** Prescriptions called in to [facility] and Benadryl.

## 2015-03-31 ENCOUNTER — Other Ambulatory Visit: Payer: Self-pay | Admitting: Radiology

## 2015-04-01 ENCOUNTER — Encounter (HOSPITAL_COMMUNITY): Payer: Self-pay

## 2015-04-01 ENCOUNTER — Ambulatory Visit (HOSPITAL_COMMUNITY)
Admission: RE | Admit: 2015-04-01 | Discharge: 2015-04-01 | Disposition: A | Discharge status: Home / Self Care | Payer: BLUE CROSS/BLUE SHIELD | Source: Ambulatory Visit | Attending: Interventional Radiology | Admitting: Interventional Radiology

## 2015-04-01 ENCOUNTER — Other Ambulatory Visit (HOSPITAL_COMMUNITY): Payer: Self-pay | Admitting: Interventional Radiology

## 2015-04-01 DIAGNOSIS — J45909 Unspecified asthma, uncomplicated: Secondary | ICD-10-CM | POA: Diagnosis not present

## 2015-04-01 DIAGNOSIS — Z87728 Personal history of other specified (corrected) congenital malformations of nervous system and sense organs: Secondary | ICD-10-CM | POA: Insufficient documentation

## 2015-04-01 DIAGNOSIS — I10 Essential (primary) hypertension: Secondary | ICD-10-CM | POA: Insufficient documentation

## 2015-04-01 DIAGNOSIS — Z48812 Encounter for surgical aftercare following surgery on the circulatory system: Secondary | ICD-10-CM | POA: Insufficient documentation

## 2015-04-01 DIAGNOSIS — E782 Mixed hyperlipidemia: Secondary | ICD-10-CM | POA: Insufficient documentation

## 2015-04-01 DIAGNOSIS — H9319 Tinnitus, unspecified ear: Secondary | ICD-10-CM | POA: Insufficient documentation

## 2015-04-01 DIAGNOSIS — N200 Calculus of kidney: Secondary | ICD-10-CM | POA: Insufficient documentation

## 2015-04-01 DIAGNOSIS — I671 Cerebral aneurysm, nonruptured: Secondary | ICD-10-CM

## 2015-04-01 DIAGNOSIS — Z823 Family history of stroke: Secondary | ICD-10-CM | POA: Diagnosis not present

## 2015-04-01 DIAGNOSIS — G935 Compression of brain: Secondary | ICD-10-CM

## 2015-04-01 DIAGNOSIS — Z91041 Radiographic dye allergy status: Secondary | ICD-10-CM | POA: Insufficient documentation

## 2015-04-01 LAB — BASIC METABOLIC PANEL
ANION GAP: 8 (ref 5–15)
BUN: 11 mg/dL (ref 6–20)
CHLORIDE: 105 mmol/L (ref 101–111)
CO2: 24 mmol/L (ref 22–32)
Calcium: 9.7 mg/dL (ref 8.9–10.3)
Creatinine, Ser: 0.83 mg/dL (ref 0.61–1.24)
GFR calc Af Amer: >60 mL/min (ref >60)
GFR calc non Af Amer: >60 mL/min (ref >60)
GLUCOSE: 135 mg/dL — AB (ref 65–99)
POTASSIUM: 3.7 mmol/L (ref 3.5–5.1)
Sodium: 137 mmol/L (ref 135–145)

## 2015-04-01 LAB — CBC
HEMATOCRIT: 42.7 % (ref 39.0–52.0)
HEMOGLOBIN: 14.5 g/dL (ref 13.0–17.0)
MCH: 30 pg (ref 26.0–34.0)
MCHC: 34 g/dL (ref 30.0–36.0)
MCV: 88.2 fL (ref 78.0–100.0)
Platelets: 342 10*3/uL (ref 150–400)
RBC: 4.84 MIL/uL (ref 4.22–5.81)
RDW: 12.5 % (ref 11.5–15.5)
WBC: 13 10*3/uL — ABNORMAL HIGH (ref 4.0–10.5)

## 2015-04-01 LAB — PROTIME-INR
INR: 1.12 (ref 0.00–1.49)
Prothrombin Time: 14.5 seconds (ref 11.6–15.2)

## 2015-04-01 LAB — APTT: aPTT: 30 seconds (ref 24–37)

## 2015-04-01 LAB — POCT ACTIVATED CLOTTING TIME: ACTIVATED CLOTTING TIME: 134 s

## 2015-04-01 MED ORDER — FENTANYL CITRATE (PF) 100 MCG/2ML IJ SOLN
INTRAMUSCULAR | Status: AC
  Filled 2015-04-01: qty 2

## 2015-04-01 MED ORDER — HEPARIN SODIUM (PORCINE) 1000 UNIT/ML IJ SOLN
INTRAMUSCULAR | Status: AC
  Filled 2015-04-01: qty 1

## 2015-04-01 MED ORDER — SODIUM CHLORIDE 0.9 % IV SOLN
Freq: Once | INTRAVENOUS | Status: AC
  Administered 2015-04-01: 07:00:00 via INTRAVENOUS

## 2015-04-01 MED ORDER — LIDOCAINE HCL 1 % IJ SOLN
INTRAMUSCULAR | Status: AC
  Filled 2015-04-01: qty 20

## 2015-04-01 MED ORDER — MIDAZOLAM HCL 2 MG/2ML IJ SOLN
INTRAMUSCULAR | Status: AC | PRN
  Administered 2015-04-01: 1 mg via INTRAVENOUS

## 2015-04-01 MED ORDER — HEPARIN SODIUM (PORCINE) 1000 UNIT/ML IJ SOLN
INTRAMUSCULAR | Status: AC | PRN
  Administered 2015-04-01: 1000 [IU] via INTRAVENOUS
  Administered 2015-04-01 (×2): 500 [IU] via INTRAVENOUS

## 2015-04-01 MED ORDER — IOHEXOL 300 MG/ML  SOLN
300.0000 mL | Freq: Once | INTRAMUSCULAR | Status: DC | PRN
  Administered 2015-04-01: 100 mL via INTRAVENOUS
  Filled 2015-04-01: qty 300

## 2015-04-01 MED ORDER — FENTANYL CITRATE (PF) 100 MCG/2ML IJ SOLN
INTRAMUSCULAR | Status: AC | PRN
  Administered 2015-04-01: 25 ug via INTRAVENOUS

## 2015-04-01 MED ORDER — MIDAZOLAM HCL 2 MG/2ML IJ SOLN
INTRAMUSCULAR | Status: AC
  Filled 2015-04-01: qty 2

## 2015-04-01 MED ORDER — SODIUM CHLORIDE 0.9 % IV SOLN
INTRAVENOUS | Status: AC
Start: 2015-04-01 — End: 2015-04-01

## 2015-04-01 NOTE — Sedation Documentation (Signed)
**Note De-identified Maille Obfuscation** Patient is resting comfortably. 

## 2015-04-01 NOTE — Sedation Documentation (Signed)
**Note De-identified Friedli Obfuscation** Patient is resting comfortably. 

## 2015-04-01 NOTE — Sedation Documentation (Signed)
**Note De-identified Huhn Obfuscation** Patient is resting comfortably. 

## 2015-04-01 NOTE — Sedation Documentation (Signed)
**Note De-identified Hallett Obfuscation** Patient is resting comfortably. 

## 2015-04-01 NOTE — Sedation Documentation (Signed)
**Note De-identified Lover Obfuscation** Patient is resting comfortably. 

## 2015-04-01 NOTE — Sedation Documentation (Signed)
**Note De-identified Newsome Obfuscation** Patient is resting comfortably. 

## 2015-04-01 NOTE — Sedation Documentation (Signed)
**Note De-Identified Radle Obfuscation** Pt from short stay with wife. Awake and alert. In no distress. MAE well. Speech clear and appropriate.

## 2015-04-01 NOTE — Sedation Documentation (Signed)
**Note De-Identified Nishikawa Obfuscation** 62fr. Exoseal applied to right groin

## 2015-04-01 NOTE — Sedation Documentation (Signed)
**Note De-identified Ostroff Obfuscation** Patient is resting comfortably. 

## 2015-04-01 NOTE — Sedation Documentation (Signed)
**Note De-identified Debroux Obfuscation** Patient is resting comfortably. 

## 2015-04-01 NOTE — H&P (Signed)
**Note De-Identified [place] Obfuscation** Chief Complaint: "I'm here for an angiogram" HPI: Derek Donaldson is an 44 y.o. 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 He has been doing very well, no neurologic sxs. He is here for follow up angiogram today PMHx, meds, labs, allergies reviewed. Contrast allergy known, has taken his pre-meds as directed. NPO this am except meds  Past Medical History:  Past Medical History  Diagnosis Date  . Mixed hyperlipidemia     takes Fenofibrate daily  . Cancer ([place]) 2010    skin ( unsure of the pathology)- removed  . Shortness of breath     with exertion  . Pneumonia 2005    hx of  . History of bronchitis   . Chiari malformation   . Numbness     left arm from chiari malformation  . History of kidney stones   . Essential hypertension, benign     takes Metoprolol,Diovan,and Amlodipine daily  . Muscle spasm     takes Flexeril daily as needed  . Asthma     Albuterol inhaler prn;just a small touch    Past Surgical History:  Past Surgical History  Procedure Laterality Date  . Stapedectomy  04/23/2003    Revision left stapedectomy  . Vasectomy    . Ercp  03/21/2012    Procedure: ENDOSCOPIC RETROGRADE CHOLANGIOPANCREATOGRAPHY (ERCP);  Surgeon: Jeryl Columbia, MD;  Location: [facility];  Service: Endoscopy;  Laterality: N/A;  aqntibiotics and type and screen  . Suboccipital craniectomy cervical laminectomy N/A 12/21/2013    Procedure: Aborted Cranioplasty For Chiari Malformation (Could not complete procedure);  Surgeon: Ashok Pall, MD;  Location: [place] [facility];  Service: Neurosurgery;  Laterality: N/A;  Aborted Cranioplasty For Chiari Malformation  . Cholecystectomy  11/13  . Mri      x 2  . Cerebral angiogram  12/2013  . Radiology with anesthesia N/A 02/20/2014    Procedure: RADIOLOGY WITH ANESTHESIA EMBOLIZATION;  Surgeon: Rob Hickman, MD;  Location: [place];  Service: Radiology;  Laterality: N/A;  . Radiology with anesthesia N/A 04/29/2014   Procedure: STAGE TWO EMBOLIZATION;  Surgeon: Luanne Bras, MD;  Location: [place];  Service: Radiology;  Laterality: N/A;  . Radiology with anesthesia N/A 06/05/2014    Procedure: Embolization;  Surgeon: Rob Hickman, MD;  Location: [place];  Service: Radiology;  Laterality: N/A;  . Radiology with anesthesia N/A 07/15/2014    Procedure: RADIOLOGY WITH ANESTHESIA/EMBOLIZATION;  Surgeon: Rob Hickman, MD;  Location: [place];  Service: Radiology;  Laterality: N/A;    Family History:  Family History  Problem Relation Age of Onset  . Stroke Paternal Grandfather   . Cancer Maternal Grandfather     unknown type  . Asthma Mother   . Asthma Brother   . Heart failure Maternal Grandmother   . Aneurysm Father     Social History:  reports that he has never smoked. He has never used smokeless tobacco. He reports that he does not drink alcohol or use illicit drugs.  Allergies:  Allergies  Allergen Reactions  . Contrast Media [Iodinated Diagnostic Agents] Anaphylaxis  . Shellfish Allergy Other (See Comments)    Child hood allergy    Medications:   Medication List    ASK your doctor about these medications        albuterol 108 (90 BASE) MCG/ACT inhaler  Commonly known as:  PROVENTIL HFA;VENTOLIN HFA  Inhale 2 puffs into the lungs every 4 (four) hours as needed for wheezing or **Note De-Identified [place] Obfuscation** shortness of breath.     amLODipine 10 MG tablet  Commonly known as:  NORVASC  Take 10 mg by mouth daily before breakfast.     ANTARA 130 MG capsule  Generic drug:  fenofibrate micronized  Take 130 mg by mouth daily before breakfast.     aspirin EC 81 MG tablet  Take 81 mg by mouth daily.     cyclobenzaprine 10 MG tablet  Commonly known as:  FLEXERIL  Take 1 tablet (10 mg total) by mouth 3 (three) times daily as needed for muscle spasms.     EPINEPHrine 0.3 mg/0.3 mL Soaj injection  Commonly known as:  EPI-PEN  Inject 0.3 mg into the muscle as needed (emergent allergic reaction).     ibuprofen  200 MG tablet  Commonly known as:  ADVIL,MOTRIN  Take 400 mg by mouth every 6 (six) hours as needed for mild pain or moderate pain.     metoprolol succinate 25 MG 24 hr tablet  Commonly known as:  TOPROL-XL  Take 25 mg by mouth daily.     oxyCODONE-acetaminophen 5-325 MG tablet  Commonly known as:  PERCOCET/ROXICET  Take 1-2 tablets by mouth every 4 (four) hours as needed for severe pain.     tamsulosin 0.4 MG Caps capsule  Commonly known as:  FLOMAX  Take 1 capsule (0.4 mg total) by mouth daily.     valsartan-hydrochlorothiazide 320-25 MG tablet  Commonly known as:  DIOVAN-HCT  Take 1 tablet by mouth daily before breakfast.        Please HPI for pertinent positives, otherwise complete 10 system ROS negative.  Physical Exam: BP 161/98 mmHg  Pulse 102  Temp(Src) 97.7 F (36.5 C) (Oral)  Resp 18  Ht 5' 10.5" (1.791 m)  Wt 287 lb (130.182 kg)  BMI 40.58 kg/m2  SpO2 97% Body mass index is 40.58 kg/(m^2).   General Appearance:  Alert, cooperative, no distress, appears stated age  Head:  Normocephalic, without obvious abnormality, atraumatic  ENT: Unremarkable  Neck: Supple, symmetrical, trachea midline  Lungs:   Clear to auscultation bilaterally, no w/r/r, respirations unlabored without use of accessory muscles.  Heart:  Regular rate and rhythm, S1, S2 normal, no murmur, rub or gallop.  Abdomen:   Soft, non-tender, non distended.  Extremities: Extremities normal, atraumatic, no cyanosis or edema  Pulses: 2+ and symmetric  Neurologic: Normal affect, no gross deficits.  Labs: Results for orders placed or performed during the hospital encounter of 04/01/15 (from the past 48 hour(s))  APTT     Status: None   Collection Time: 04/01/15  6:57 AM  Result Value Ref Range   aPTT 30 24 - 37 seconds  Basic metabolic panel     Status: Abnormal   Collection Time: 04/01/15  6:57 AM  Result Value Ref Range   Sodium 137 135 - 145 mmol/L   Potassium 3.7 3.5 - 5.1 mmol/L   Chloride  105 101 - 111 mmol/L   CO2 24 22 - 32 mmol/L   Glucose, Bld 135 (H) 65 - 99 mg/dL   BUN 11 6 - 20 mg/dL   Creatinine, Ser 0.83 0.61 - 1.24 mg/dL   Calcium 9.7 8.9 - 10.3 mg/dL   GFR calc non Af Amer >60 >60 mL/min   GFR calc Af Amer >60 >60 mL/min    Comment: (NOTE) The eGFR has been calculated using the CKD EPI equation. This calculation has not been validated in all clinical situations. eGFR's persistently <60 mL/min signify possible **Note De-Identified [place] Obfuscation** Chronic Kidney Disease.    Anion gap 8 5 - 15  CBC     Status: Abnormal   Collection Time: 04/01/15  6:57 AM  Result Value Ref Range   WBC 13.0 (H) 4.0 - 10.5 K/uL   RBC 4.84 4.22 - 5.81 MIL/uL   Hemoglobin 14.5 13.0 - 17.0 g/dL   HCT 42.7 39.0 - 52.0 %   MCV 88.2 78.0 - 100.0 fL   MCH 30.0 26.0 - 34.0 pg   MCHC 34.0 30.0 - 36.0 g/dL   RDW 12.5 11.5 - 15.5 %   Platelets 342 150 - 400 K/uL  Protime-INR     Status: None   Collection Time: 04/01/15  6:57 AM  Result Value Ref Range   Prothrombin Time 14.5 11.6 - 15.2 seconds   INR 1.12 0.00 - 1.49    Imaging: No results found.  Assessment/Plan Hx of dural fistula For cerebral angiogram today Labs reviewed, WBC slightly up from Prednisone dosing. Risks and Benefits discussed with the patient including, but not limited to bleeding, infection, vascular injury or contrast induced renal failure. All of the patient's questions were answered, patient is agreeable to proceed. Consent signed and in chart.   Ascencion Dike PA-C 04/01/2015, 7:58 AM

## 2015-04-01 NOTE — Sedation Documentation (Signed)
**Note De-identified [place] Obfuscation** Patient is resting comfortably. 

## 2015-04-01 NOTE — Procedures (Signed)
**Note De-Identified Intrieri Obfuscation** S/P 4 vesssel and  Bilateral ECA arteriograms. RT CFA approach. Findings. 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

## 2015-04-01 NOTE — Discharge Instructions (Signed)
**Note De-identified [place] Obfuscation** Angiogram, Care After °Refer to this sheet in the next few weeks. These instructions provide you with information about caring for yourself after your procedure. Your health care provider may also give you more specific instructions. Your treatment has been planned according to current medical practices, but problems sometimes occur. Call your health care provider if you have any problems or questions after your procedure. °WHAT TO EXPECT AFTER THE PROCEDURE °After your procedure, it is typical to have the following: °· Bruising at the catheter insertion site that usually fades within 1-2 weeks. °· Blood collecting in the tissue (hematoma) that may be painful to the touch. It should usually decrease in size and tenderness within 1-2 weeks. °HOME CARE INSTRUCTIONS °· Take medicines only as directed by your health care provider. °· You may shower 24-48 hours after the procedure or as directed by your health care provider. Remove the bandage (dressing) and gently wash the site with plain soap and water. Pat the area dry with a clean towel. Do not rub the site, because this may cause bleeding. °· Do not take baths, swim, or use a hot tub until your health care provider approves. °· Check your insertion site every day for redness, swelling, or drainage. °· Do not apply powder or lotion to the site. °· Do not lift over 10 lb (4.5 kg) for 5 days after your procedure or as directed by your health care provider. °· Ask your health care provider when it is okay to: °¨ Return to work or school. °¨ Resume usual physical activities or sports. °¨ Resume sexual activity. °· Do not drive home if you are discharged the same day as the procedure. Have someone else drive you. °· You may drive 24 hours after the procedure unless otherwise instructed by your health care provider. °· Do not operate machinery or power tools for 24 hours after the procedure or as directed by your health care provider. °· If your procedure was done as an  outpatient procedure, which means that you went home the same day as your procedure, a responsible adult should be with you for the first 24 hours after you arrive home. °· Keep all follow-up visits as directed by your health care provider. This is important. °SEEK MEDICAL CARE IF: °· You have a fever. °· You have chills. °· You have increased bleeding from the catheter insertion site. Hold pressure on the site. °SEEK IMMEDIATE MEDICAL CARE IF: °· You have unusual pain at the catheter insertion site. °· You have redness, warmth, or swelling at the catheter insertion site. °· You have drainage (other than a small amount of blood on the dressing) from the catheter insertion site. °· The catheter insertion site is bleeding, and the bleeding does not stop after 30 minutes of holding steady pressure on the site. °· The area near or just beyond the catheter insertion site becomes pale, cool, tingly, or numb. °  °This information is not intended to replace advice given to you by your health care provider. Make sure you discuss any questions you have with your health care provider. °  °Document Released: 11/19/2004 Document Revised: 05/24/2014 Document Reviewed: 10/04/2012 °Elsevier Interactive Patient Education ©2016 [employer] Inc[employer] ° °

## 2015-04-01 NOTE — Sedation Documentation (Signed)
**Note De-identified [place] Obfuscation** Patient is resting comfortably. 

## 2015-04-01 NOTE — Sedation Documentation (Signed)
**Note De-identified C[place] Obfuscation** Patient is resting comfortably. 

## 2015-06-04 IMAGING — XA IR EVAL AND MANAGEMENT
1 series · 13 of 24 positions shown · IV contrast (IODINE)
Comparison: none

[Series 300: neuro · 13 of 423 slices shown]
[im 1/423]
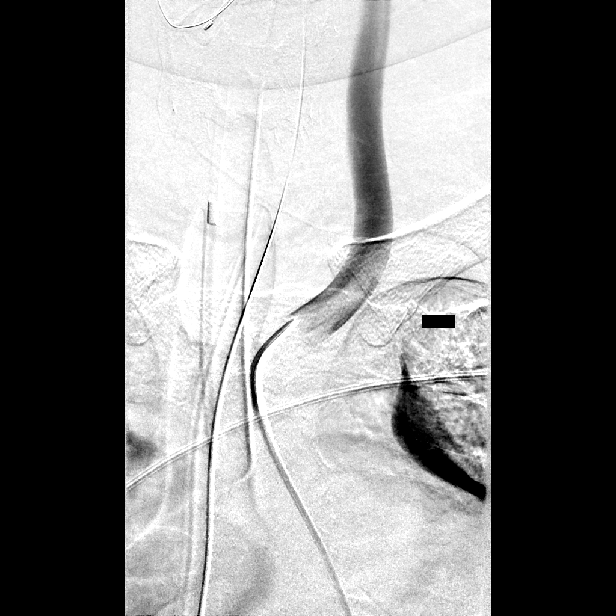
[im 37/423]
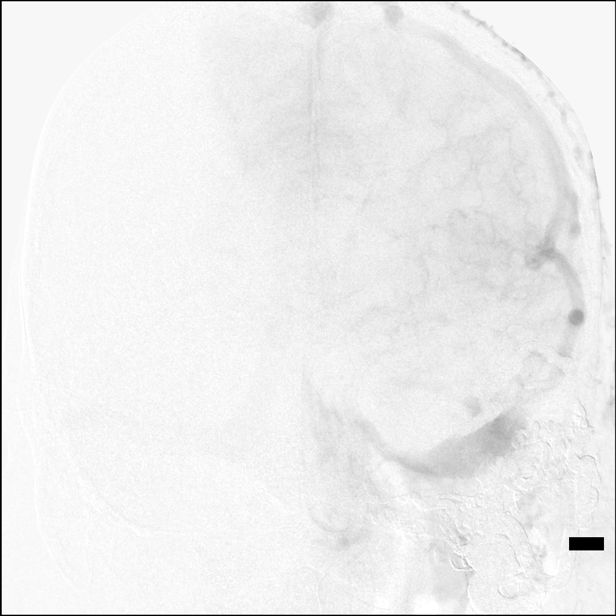
[im 74/423]
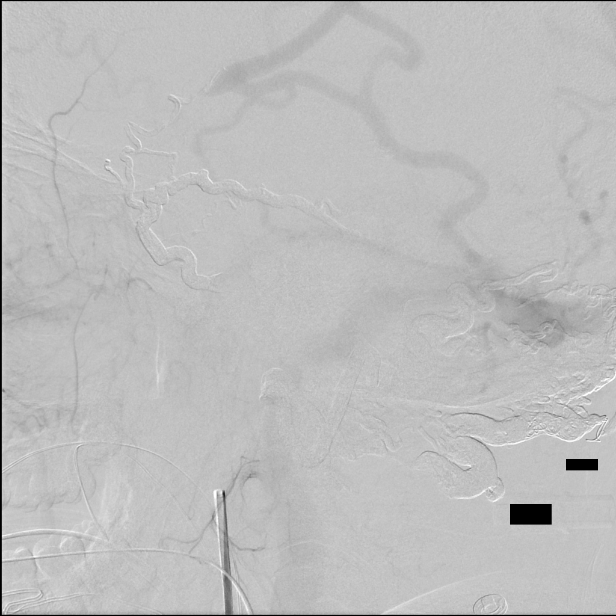
[im 111/423]
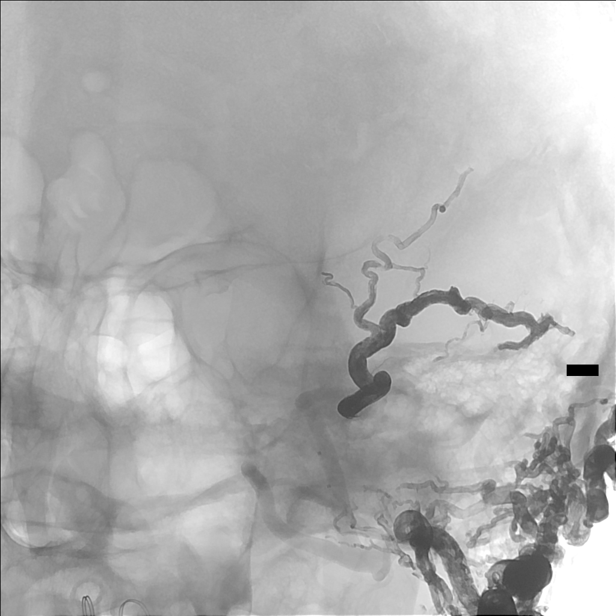
[im 147/423]
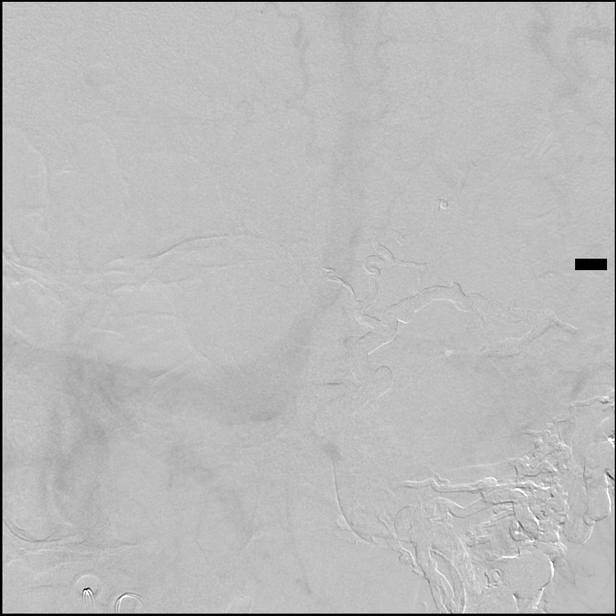
[im 184/423]
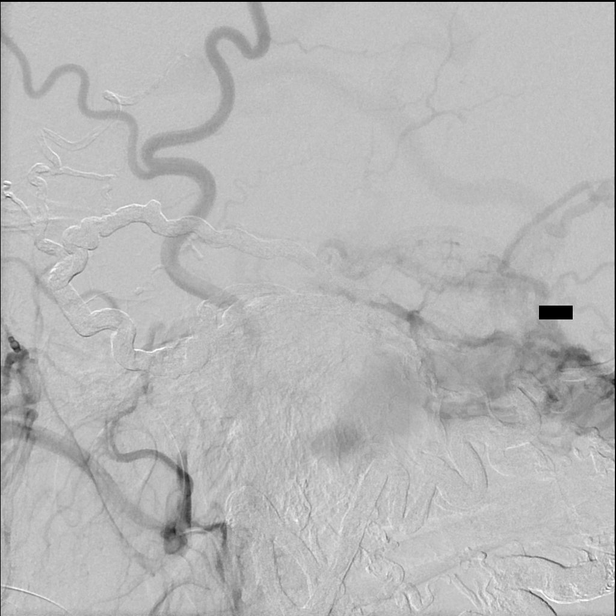
[im 221/423]
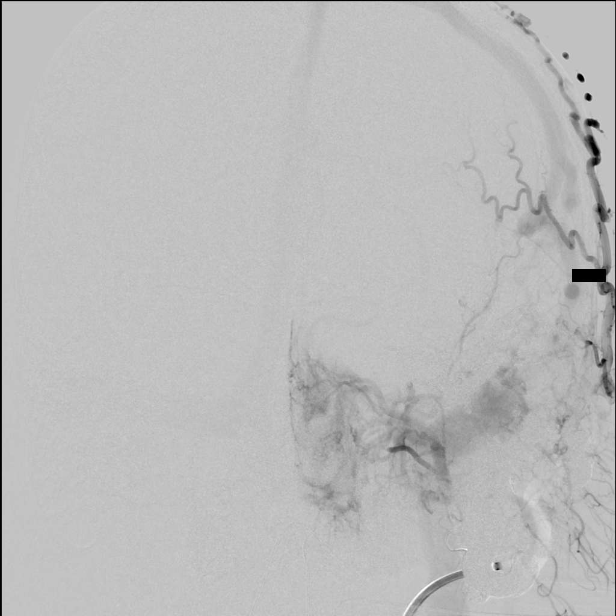
[im 239/423]
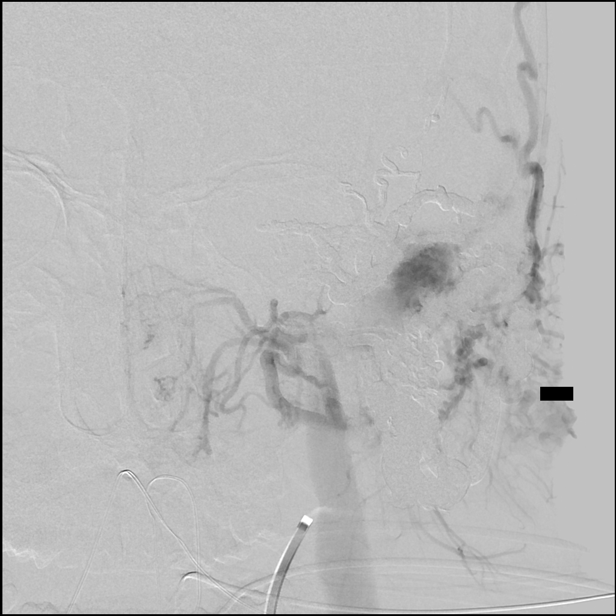
[im 276/423]
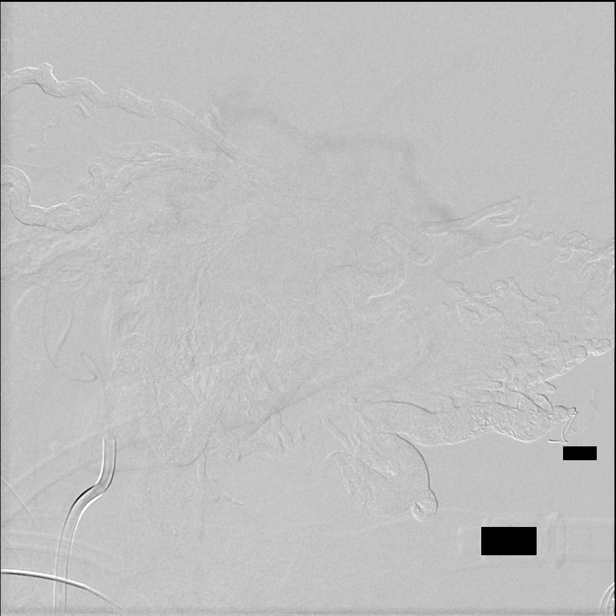
[im 312/423]
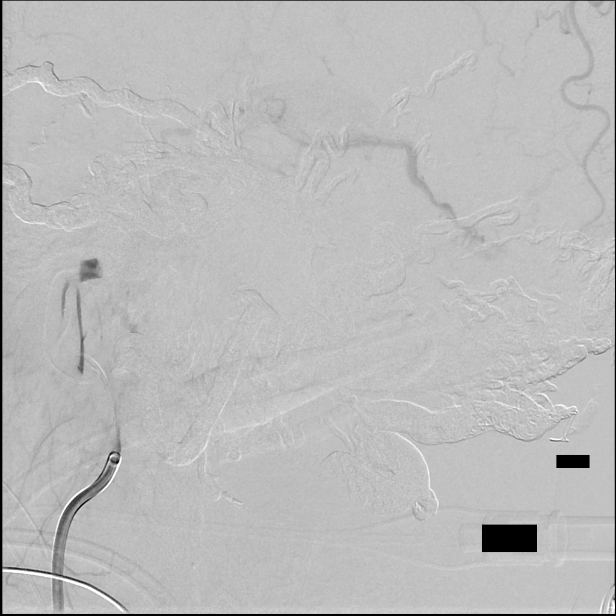
[im 349/423]
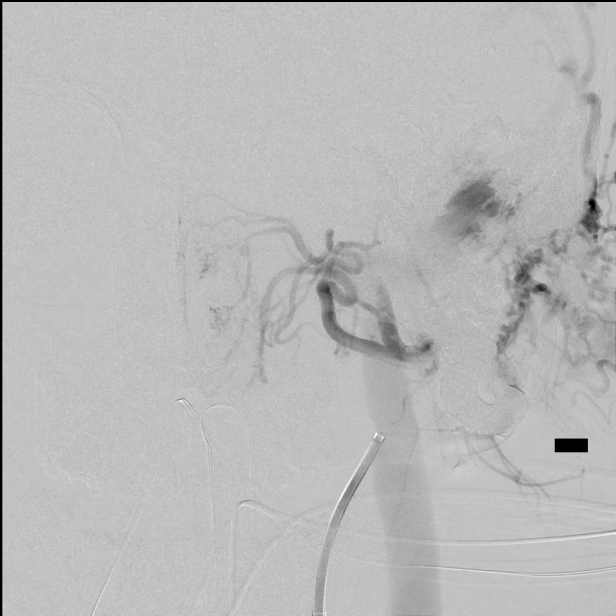
[im 386/423]
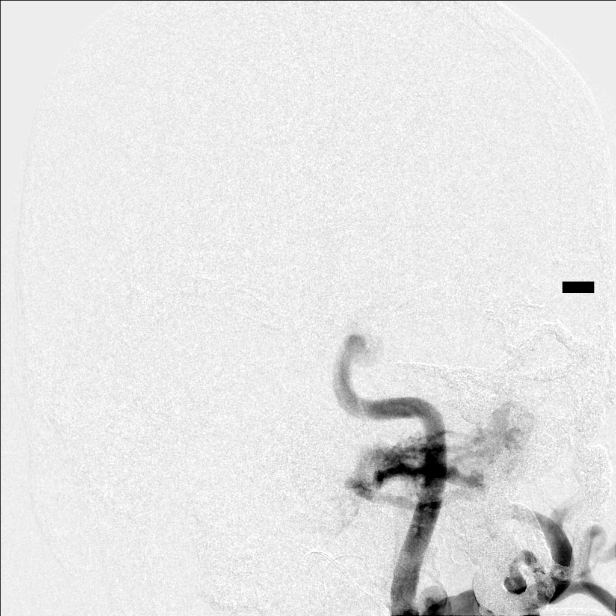
[im 423/423]
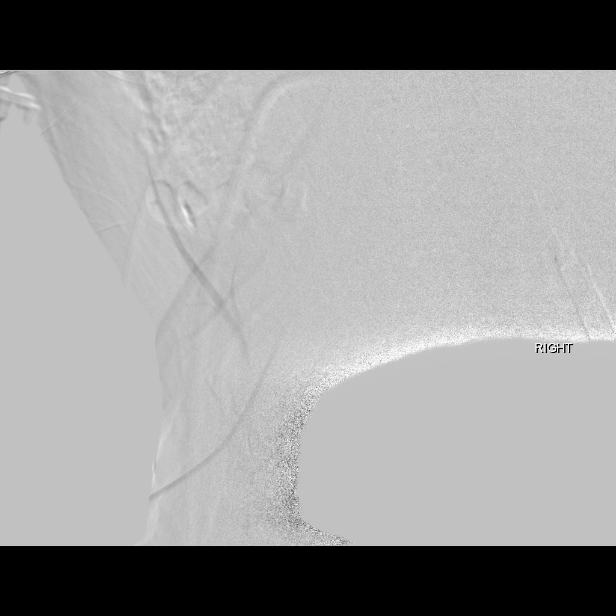

[13 of 24 positions shown; findings below may reference images not displayed]

EXAM:
ESTABLISHED PATIENT OFFICE VISIT

CHIEF COMPLAINT:
Bilateral pulsatile tinnitus. History of large dural AV malformation
with partial embolization.

Current Pain Level: 1-10

HISTORY OF PRESENT ILLNESS:
The patient is a 43 year old right-handed gentleman who is status
post 2 stages of a large fast flow dural AV malformation with
multiple arterial feeders from the external carotid arteries
bilaterally left greater than right, the left vertebral artery, the
left ascending cervical branch of the thyrocervical trunk and
possibly the hypophyseal trunk of the internal carotid artery.

The patient underwent the second stage of his embolization
approximately 2 weeks go where 3 large arterial feeders were
embolized with Onyx 18 and 34. At the immediate post treatment
arteriograms demonstrated continued albeit decreased flow into the
dural fistula from the posterior auricular branch of the left
external carotid artery, and possibly of the posterior branch of the
superficial temporal artery.

The patient returns today with his wife for follow-up.

The patient is doing significantly better from the point upon review
of left-sided pulsatile tinnitus which he claims is a [DATE] compared
to prior to the most recent embolization. The right-sided steady
[DATE] pulsatile tinnitus remains unchanged.

He denies any symptoms of visual aberrations, conjunctival
injection, change in his taste of foods, or difficulty swallowing.

He denies any visual acuity difficulties in terms of blindness, or
double vision.

No symptoms of complete hearing loss either.

He denies any recent chills, fevers or rigors. His remaining review
of systems remains within normal limits.

Of note is a transient episode of severe pain involving his left
frontal temporal region, and the retro mastoid region and the jaw
region approximately 5 days post embolization. This responded to
increasing his hydrocodone. At the present time he takes no
anti-inflammatories or pain medications.

Past medical history and surgical history already on chart
unchanged.

Present medications: Valsartan. Amlodipine. Metoprolol. Aspirin 81
mg. Fenofibrate. ProAir inhaler as needed.

Allergies:  Contrast dye and shellfish.

Surgical history and family history also unchanged.

PHYSICAL EXAMINATION:
On examination in no acute distress.  Affect normal.

On inspection, the patient continues to have mild facial swelling
overlying the left buccal, mastoid and the left ear pinna.

However, there is no conjunctiva injection or proptosis noted.

Neurologically, otherwise, fully intact.

ASSESSMENT AND PLAN:
The patient's immediate post treatment second stage embolization
radiographs were reviewed with him and his wife.

The other feeders from the left vertebral artery, and the ascending
branch of the left thyrocervical trunk were brought to their
attention.

It was felt given the size of the fistula and also the numerous
branches that a stage III embolization would be needed. This was
discussed with him and his spouse. Reasons were discussed.

The patient and his wife are in agreement to proceed with third
stage embolization probably the last week in [REDACTED] or the first
week in [REDACTED].

Questions were and answered to their satisfaction. This will be
scheduled as planned.

## 2015-06-06 ENCOUNTER — Other Ambulatory Visit (HOSPITAL_COMMUNITY): Payer: Self-pay | Admitting: Interventional Radiology

## 2015-06-06 DIAGNOSIS — R208 Other disturbances of skin sensation: Secondary | ICD-10-CM

## 2015-06-06 DIAGNOSIS — R202 Paresthesia of skin: Secondary | ICD-10-CM

## 2015-06-06 DIAGNOSIS — M79602 Pain in left arm: Secondary | ICD-10-CM

## 2015-06-06 DIAGNOSIS — R200 Anesthesia of skin: Secondary | ICD-10-CM

## 2015-08-13 ENCOUNTER — Other Ambulatory Visit: Payer: Self-pay

## 2015-08-13 LAB — URINALYSIS
Bilirubin, UA: NEGATIVE
GLUCOSE, UA: NEGATIVE
KETONES UA: NEGATIVE
LEUKOCYTES UA: NEGATIVE
NITRITE UA: NEGATIVE
PROTEIN UA: NEGATIVE
RBC UA: NEGATIVE
SPEC GRAV UA: 1.02 (ref 1.005–1.030)
Urobilinogen, Ur: 0.2 mg/dL (ref 0.2–1.0)
pH, UA: 7.5 (ref 5.0–7.5)

## 2015-10-17 ENCOUNTER — Ambulatory Visit (HOSPITAL_COMMUNITY): Payer: BLUE CROSS/BLUE SHIELD

## 2015-10-17 ENCOUNTER — Other Ambulatory Visit (HOSPITAL_COMMUNITY): Payer: Self-pay | Admitting: Interventional Radiology

## 2015-10-17 ENCOUNTER — Ambulatory Visit (HOSPITAL_COMMUNITY)
Admission: RE | Admit: 2015-10-17 | Discharge: 2015-10-17 | Disposition: A | Discharge status: Home / Self Care | Payer: BLUE CROSS/BLUE SHIELD | Source: Ambulatory Visit | Attending: Interventional Radiology | Admitting: Interventional Radiology

## 2015-10-17 DIAGNOSIS — R202 Paresthesia of skin: Secondary | ICD-10-CM | POA: Insufficient documentation

## 2015-10-17 DIAGNOSIS — G950 Syringomyelia and syringobulbia: Secondary | ICD-10-CM | POA: Insufficient documentation

## 2015-10-17 DIAGNOSIS — M79602 Pain in left arm: Secondary | ICD-10-CM | POA: Insufficient documentation

## 2015-10-17 DIAGNOSIS — I671 Cerebral aneurysm, nonruptured: Secondary | ICD-10-CM | POA: Insufficient documentation

## 2015-10-17 DIAGNOSIS — R200 Anesthesia of skin: Secondary | ICD-10-CM

## 2015-10-17 DIAGNOSIS — R208 Other disturbances of skin sensation: Secondary | ICD-10-CM | POA: Diagnosis not present

## 2015-10-20 ENCOUNTER — Encounter (HOSPITAL_COMMUNITY): Payer: Self-pay | Admitting: Emergency Medicine

## 2015-10-20 ENCOUNTER — Emergency Department (HOSPITAL_COMMUNITY)
Admission: EM | Admit: 2015-10-20 | Discharge: 2015-10-20 | Disposition: A | Discharge status: Home / Self Care | Payer: BLUE CROSS/BLUE SHIELD | Attending: Emergency Medicine | Admitting: Emergency Medicine

## 2015-10-20 ENCOUNTER — Emergency Department (HOSPITAL_COMMUNITY): Payer: BLUE CROSS/BLUE SHIELD

## 2015-10-20 DIAGNOSIS — E876 Hypokalemia: Secondary | ICD-10-CM

## 2015-10-20 DIAGNOSIS — Z79899 Other long term (current) drug therapy: Secondary | ICD-10-CM | POA: Insufficient documentation

## 2015-10-20 DIAGNOSIS — Z7982 Long term (current) use of aspirin: Secondary | ICD-10-CM | POA: Diagnosis not present

## 2015-10-20 DIAGNOSIS — R079 Chest pain, unspecified: Secondary | ICD-10-CM | POA: Diagnosis not present

## 2015-10-20 DIAGNOSIS — E782 Mixed hyperlipidemia: Secondary | ICD-10-CM | POA: Insufficient documentation

## 2015-10-20 DIAGNOSIS — Z85820 Personal history of malignant melanoma of skin: Secondary | ICD-10-CM | POA: Insufficient documentation

## 2015-10-20 DIAGNOSIS — M436 Torticollis: Secondary | ICD-10-CM | POA: Diagnosis present

## 2015-10-20 DIAGNOSIS — I10 Essential (primary) hypertension: Secondary | ICD-10-CM | POA: Insufficient documentation

## 2015-10-20 DIAGNOSIS — J45909 Unspecified asthma, uncomplicated: Secondary | ICD-10-CM | POA: Diagnosis not present

## 2015-10-20 DIAGNOSIS — Z791 Long term (current) use of non-steroidal anti-inflammatories (NSAID): Secondary | ICD-10-CM | POA: Insufficient documentation

## 2015-10-20 LAB — COMPREHENSIVE METABOLIC PANEL
ALT: 22 U/L (ref 17–63)
AST: 16 U/L (ref 15–41)
Albumin: 4.2 g/dL (ref 3.5–5.0)
Alkaline Phosphatase: 38 U/L (ref 38–126)
Anion gap: 7 (ref 5–15)
BILIRUBIN TOTAL: 0.4 mg/dL (ref 0.3–1.2)
BUN: 11 mg/dL (ref 6–20)
CHLORIDE: 104 mmol/L (ref 101–111)
CO2: 26 mmol/L (ref 22–32)
CREATININE: 0.72 mg/dL (ref 0.61–1.24)
Calcium: 9 mg/dL (ref 8.9–10.3)
Glucose, Bld: 88 mg/dL (ref 65–99)
Potassium: 3.2 mmol/L — ABNORMAL LOW (ref 3.5–5.1)
Sodium: 137 mmol/L (ref 135–145)
TOTAL PROTEIN: 7.1 g/dL (ref 6.5–8.1)

## 2015-10-20 LAB — CBC WITH DIFFERENTIAL/PLATELET
Basophils Absolute: 0.1 10*3/uL (ref 0.0–0.1)
Basophils Relative: 1 %
EOS PCT: 5 %
Eosinophils Absolute: 0.5 10*3/uL (ref 0.0–0.7)
HEMATOCRIT: 40.2 % (ref 39.0–52.0)
Hemoglobin: 13.4 g/dL (ref 13.0–17.0)
LYMPHS ABS: 2.7 10*3/uL (ref 0.7–4.0)
LYMPHS PCT: 27 %
MCH: 29.8 pg (ref 26.0–34.0)
MCHC: 33.3 g/dL (ref 30.0–36.0)
MCV: 89.5 fL (ref 78.0–100.0)
MONO ABS: 0.7 10*3/uL (ref 0.1–1.0)
Monocytes Relative: 7 %
NEUTROS ABS: 6.1 10*3/uL (ref 1.7–7.7)
Neutrophils Relative %: 60 %
PLATELETS: 267 10*3/uL (ref 150–400)
RBC: 4.49 MIL/uL (ref 4.22–5.81)
RDW: 13.1 % (ref 11.5–15.5)
WBC: 10.1 10*3/uL (ref 4.0–10.5)

## 2015-10-20 LAB — I-STAT TROPONIN, ED: TROPONIN I, POC: 0.01 ng/mL (ref 0.00–0.08)

## 2015-10-20 MED ORDER — POTASSIUM CHLORIDE CRYS ER 20 MEQ PO TBCR
20.0000 meq | EXTENDED_RELEASE_TABLET | Freq: Every day | ORAL | Status: AC
Start: 2015-10-20 — End: ?

## 2015-10-20 MED ORDER — POTASSIUM CHLORIDE CRYS ER 20 MEQ PO TBCR
40.0000 meq | EXTENDED_RELEASE_TABLET | Freq: Once | ORAL | Status: AC
  Administered 2015-10-20: 40 meq via ORAL
  Filled 2015-10-20: qty 2

## 2015-10-20 NOTE — ED Notes (Signed)
**Note De-identified [place] Obfuscation** Lab at bedside

## 2015-10-20 NOTE — ED Provider Notes (Signed)
**Note De-Identified Larsson Obfuscation** CSN: [id number]     Arrival date & time 10/20/15  1202 History   First MD Initiated Contact with Patient 10/20/15 1503     Chief Complaint  Patient presents with  . Torticollis     (Consider location/radiation/quality/duration/timing/severity/associated sxs/prior Treatment) Patient is a 45 y.o. male presenting with chest pain. The history is provided by the patient (Patient states that he's had some muscle spasm in the left side of his neck when that happens he has some chest pain. This only lasts for minutes).  Chest Pain Pain location:  L chest Pain quality: aching   Pain radiates to:  Does not radiate Pain radiates to the back: no   Pain severity:  Mild Onset quality:  Sudden Timing:  Rare Progression:  Resolved Chronicity:  New Context: not breathing   Associated symptoms: no abdominal pain, no back pain, no cough, no fatigue and no headache     Past Medical History  Diagnosis Date  . Mixed hyperlipidemia     takes Fenofibrate daily  . Cancer ([place]) 2010    skin ( unsure of the pathology)- removed  . Shortness of breath     with exertion  . Pneumonia 2005    hx of  . History of bronchitis   . Chiari malformation   . Numbness     left arm from chiari malformation  . History of kidney stones   . Essential hypertension, benign     takes Metoprolol,Diovan,and Amlodipine daily  . Muscle spasm     takes Flexeril daily as needed  . Asthma     Albuterol inhaler prn;just a small touch   Past Surgical History  Procedure Laterality Date  . Stapedectomy  04/23/2003    Revision left stapedectomy  . Vasectomy    . Ercp  03/21/2012    Procedure: ENDOSCOPIC RETROGRADE CHOLANGIOPANCREATOGRAPHY (ERCP);  Surgeon: Jeryl Columbia, MD;  Location: [facility];  Service: Endoscopy;  Laterality: N/A;  aqntibiotics and type and screen  . Suboccipital craniectomy cervical laminectomy N/A 12/21/2013    Procedure: Aborted Cranioplasty For Chiari Malformation (Could not complete procedure);   Surgeon: Ashok Pall, MD;  Location: [place] [facility];  Service: Neurosurgery;  Laterality: N/A;  Aborted Cranioplasty For Chiari Malformation  . Cholecystectomy  11/13  . Mri      x 2  . Cerebral angiogram  12/2013  . Radiology with anesthesia N/A 02/20/2014    Procedure: RADIOLOGY WITH ANESTHESIA EMBOLIZATION;  Surgeon: Rob Hickman, MD;  Location: [facility];  Service: Radiology;  Laterality: N/A;  . Radiology with anesthesia N/A 04/29/2014    Procedure: STAGE TWO EMBOLIZATION;  Surgeon: Luanne Bras, MD;  Location: [place];  Service: Radiology;  Laterality: N/A;  . Radiology with anesthesia N/A 06/05/2014    Procedure: Embolization;  Surgeon: Rob Hickman, MD;  Location: [place];  Service: Radiology;  Laterality: N/A;  . Radiology with anesthesia N/A 07/15/2014    Procedure: RADIOLOGY WITH ANESTHESIA/EMBOLIZATION;  Surgeon: Rob Hickman, MD;  Location: [place];  Service: Radiology;  Laterality: N/A;   Family History  Problem Relation Age of Onset  . Stroke Paternal Grandfather   . Cancer Maternal Grandfather     unknown type  . Asthma Mother   . Asthma Brother   . Heart failure Maternal Grandmother   . Aneurysm Father    Social History  Substance Use Topics  . Smoking status: Never Smoker   . Smokeless tobacco: Never Used  . Alcohol Use: No **Note De-Identified Koehl Obfuscation** Review of Systems  Constitutional: Negative for appetite change and fatigue.  HENT: Negative for congestion, ear discharge and sinus pressure.   Eyes: Negative for discharge.  Respiratory: Negative for cough.   Cardiovascular: Positive for chest pain.  Gastrointestinal: Negative for abdominal pain and diarrhea.  Genitourinary: Negative for frequency and hematuria.  Musculoskeletal: Negative for back pain.  Skin: Negative for rash.  Neurological: Negative for seizures and headaches.  Psychiatric/Behavioral: Negative for hallucinations.      Allergies  Contrast media and Shellfish allergy  Home Medications   Prior  to Admission medications   Medication Sig Start Date End Date Taking? Authorizing Provider  albuterol (PROVENTIL HFA;VENTOLIN HFA) 108 (90 BASE) MCG/ACT inhaler Inhale 2 puffs into the lungs every 4 (four) hours as needed for wheezing or shortness of breath.   Yes Historical Provider, MD  amLODipine (NORVASC) 10 MG tablet Take 10 mg by mouth daily before breakfast.    Yes Historical Provider, MD  aspirin EC 81 MG tablet Take 81 mg by mouth daily.   Yes Historical Provider, MD  EPINEPHrine 0.3 mg/0.3 mL IJ SOAJ injection Inject 0.3 mg into the muscle as needed (emergent allergic reaction).   Yes Historical Provider, MD  fenofibrate micronized (ANTARA) 130 MG capsule Take 130 mg by mouth daily before breakfast.   Yes Historical Provider, MD  ibuprofen (ADVIL,MOTRIN) 200 MG tablet Take 400 mg by mouth every 6 (six) hours as needed for mild pain or moderate pain.   Yes Historical Provider, MD  metoprolol succinate (TOPROL-XL) 25 MG 24 hr tablet Take 25 mg by mouth daily.   Yes Historical Provider, MD  valsartan-hydrochlorothiazide (DIOVAN-HCT) 320-25 MG per tablet Take 1 tablet by mouth daily before breakfast.    Yes Historical Provider, MD  potassium chloride SA (K-DUR,KLOR-CON) 20 MEQ tablet Take 1 tablet (20 mEq total) by mouth daily. 10/20/15   Milton Ferguson, MD   BP 151/102 mmHg  Pulse 70  Temp(Src) 98.3 F (36.8 C) (Oral)  Resp 22  Ht 5\' 10"  (1.778 m)  Wt 292 lb (132.45 kg)  BMI 41.90 kg/m2  SpO2 98% Physical Exam  Constitutional: He is oriented to person, place, and time. He appears well-developed.  HENT:  Head: Normocephalic.  Eyes: Conjunctivae and EOM are normal. No scleral icterus.  Neck: Neck supple. No thyromegaly present.  Cardiovascular: Normal rate and regular rhythm.  Exam reveals no gallop and no friction rub.   No murmur heard. Pulmonary/Chest: No stridor. He has no wheezes. He has no rales. He exhibits no tenderness.  Abdominal: He exhibits no distension. There is no  tenderness. There is no rebound.  Musculoskeletal: Normal range of motion. He exhibits no edema.  Lymphadenopathy:    He has no cervical adenopathy.  Neurological: He is oriented to person, place, and time. He exhibits normal muscle tone. Coordination normal.  Skin: No rash noted. No erythema.  Psychiatric: He has a normal mood and affect. His behavior is normal.    ED Course  Procedures (including critical care time) Labs Review Labs Reviewed  COMPREHENSIVE METABOLIC PANEL - Abnormal; Notable for the following:    Potassium 3.2 (*)    All other components within normal limits  CBC WITH DIFFERENTIAL/PLATELET  Randolm Idol, ED    Imaging Review Dg Chest 2 View  10/20/2015  CLINICAL DATA:  Pain. EXAM: CHEST  2 VIEW COMPARISON:  None. FINDINGS: The heart size and mediastinal contours are within normal limits. Both lungs are clear. The visualized skeletal structures are unremarkable. IMPRESSION: **Note De-Identified [place]ree Obfuscation** No active cardiopulmonary disease. Electronically Signed   By: Dorise Bullion III M.D   On: 10/20/2015 16:27   I have personally reviewed and evaluated these images and lab results as part of my medical decision-making.   EKG Interpretation   Date/Time:  Monday October 20 2015 12:15:35 EDT Ventricular Rate:  71 PR Interval:  162 QRS Duration: 116 QT Interval:  396 QTC Calculation: 430 R Axis:   -22 Text Interpretation:  Normal sinus rhythm Left ventricular hypertrophy  with QRS widening Abnormal ECG since last tracing no significant change  Confirmed by MILLER  MD, BRIAN ([id number]) on 10/20/2015 12:21:07 PM      MDM   Final diagnoses:  Hypokalemia   Potassium mildly low. Rest of labs unremarkable including troponin. Chest x-ray unremarkable EKG shows no acute changes. Doubt chest pain is coronary artery disease. Will treat hypokalemic and have patient follow-up with his PCP   Milton Ferguson, MD 10/20/15 1650

## 2015-10-20 NOTE — ED Notes (Signed)
**Note De-Identified [place] Obfuscation** Pt c/o pain in left side of neck. Pt states feels like muscle spasm however pt reports having some pain in left side of chest and radiating down left arm. Pt states he has history of chiari malformation and blood vessel surgery in neck.

## 2015-10-20 NOTE — Discharge Instructions (Signed)
**Note De-Identified [place] Obfuscation** Follow up with your md in 1-2 weeks for recheck.  Return if problems

## 2015-11-20 ENCOUNTER — Telehealth (HOSPITAL_COMMUNITY): Payer: Self-pay

## 2015-11-20 NOTE — Telephone Encounter (Signed)
**Note De-Identified Kimbley Obfuscation** Called to schedule consult, left vm for pt to call back. AW

## 2015-11-21 ENCOUNTER — Other Ambulatory Visit (HOSPITAL_COMMUNITY): Payer: Self-pay | Admitting: Interventional Radiology

## 2015-11-21 DIAGNOSIS — I671 Cerebral aneurysm, nonruptured: Secondary | ICD-10-CM

## 2015-11-21 DIAGNOSIS — IMO0002 Reserved for concepts with insufficient information to code with codable children: Secondary | ICD-10-CM

## 2015-11-27 ENCOUNTER — Ambulatory Visit (HOSPITAL_COMMUNITY): Admission: RE | Admit: 2015-11-27 | Payer: BLUE CROSS/BLUE SHIELD | Source: Ambulatory Visit

## 2015-12-02 ENCOUNTER — Ambulatory Visit (HOSPITAL_COMMUNITY)
Admission: RE | Admit: 2015-12-02 | Discharge: 2015-12-02 | Disposition: A | Discharge status: Home / Self Care | Payer: BLUE CROSS/BLUE SHIELD | Source: Ambulatory Visit | Attending: Interventional Radiology | Admitting: Interventional Radiology

## 2015-12-02 DIAGNOSIS — IMO0002 Reserved for concepts with insufficient information to code with codable children: Secondary | ICD-10-CM

## 2015-12-02 DIAGNOSIS — I671 Cerebral aneurysm, nonruptured: Secondary | ICD-10-CM

## 2015-12-16 ENCOUNTER — Telehealth (HOSPITAL_COMMUNITY): Payer: Self-pay

## 2015-12-16 NOTE — Telephone Encounter (Signed)
**Note De-Identified [place] Obfuscation** Pt called to inform us that Dr. Christella Noa would like to wait until he speaks with a Dr. From [facility] before scheduling procedure. Will give [place] a call when he finds out more information.AW

## 2016-07-09 ENCOUNTER — Other Ambulatory Visit (HOSPITAL_COMMUNITY): Payer: Self-pay | Admitting: Interventional Radiology

## 2016-07-09 DIAGNOSIS — I671 Cerebral aneurysm, nonruptured: Secondary | ICD-10-CM

## 2016-07-12 ENCOUNTER — Telehealth: Payer: Self-pay | Admitting: Radiology

## 2016-07-12 NOTE — Progress Notes (Signed)
**Note De-Identified [place] Obfuscation** Pt is scheduled for cerebral arteriogram 07/20/2016  Allergic to contrast dye  Pre medication called to [place], [place]  355 pm 07/12/16  Prednisone 50 mg #3 Benadryl 50 Mg #1  Pred 50 mg 730 pm March 5 Pred 50 mg 130 am March 6 Pred 50 mg 730 am March 6 Benadryl 50 mg 730 am March 6

## 2016-07-19 ENCOUNTER — Other Ambulatory Visit: Payer: Self-pay | Admitting: Physician Assistant

## 2016-07-20 ENCOUNTER — Ambulatory Visit (HOSPITAL_COMMUNITY)
Admission: RE | Admit: 2016-07-20 | Discharge: 2016-07-20 | Disposition: A | Discharge status: Home / Self Care | Payer: BLUE CROSS/BLUE SHIELD | Source: Ambulatory Visit | Attending: Interventional Radiology | Admitting: Interventional Radiology

## 2016-07-20 ENCOUNTER — Other Ambulatory Visit (HOSPITAL_COMMUNITY): Payer: Self-pay | Admitting: Interventional Radiology

## 2016-07-20 DIAGNOSIS — I671 Cerebral aneurysm, nonruptured: Secondary | ICD-10-CM | POA: Diagnosis not present

## 2016-07-20 DIAGNOSIS — I10 Essential (primary) hypertension: Secondary | ICD-10-CM | POA: Insufficient documentation

## 2016-07-20 DIAGNOSIS — Z823 Family history of stroke: Secondary | ICD-10-CM | POA: Insufficient documentation

## 2016-07-20 DIAGNOSIS — Z7982 Long term (current) use of aspirin: Secondary | ICD-10-CM | POA: Diagnosis not present

## 2016-07-20 DIAGNOSIS — J45909 Unspecified asthma, uncomplicated: Secondary | ICD-10-CM | POA: Diagnosis not present

## 2016-07-20 DIAGNOSIS — E782 Mixed hyperlipidemia: Secondary | ICD-10-CM | POA: Diagnosis not present

## 2016-07-20 DIAGNOSIS — H9313 Tinnitus, bilateral: Secondary | ICD-10-CM | POA: Diagnosis present

## 2016-07-20 DIAGNOSIS — Z87798 Personal history of other (corrected) congenital malformations: Secondary | ICD-10-CM | POA: Diagnosis not present

## 2016-07-20 DIAGNOSIS — Z8249 Family history of ischemic heart disease and other diseases of the circulatory system: Secondary | ICD-10-CM | POA: Insufficient documentation

## 2016-07-20 HISTORY — PX: IR GENERIC HISTORICAL: IMG1180011

## 2016-07-20 LAB — CBC
HEMATOCRIT: 44.6 % (ref 39.0–52.0)
HEMOGLOBIN: 15.4 g/dL (ref 13.0–17.0)
MCH: 30.1 pg (ref 26.0–34.0)
MCHC: 34.5 g/dL (ref 30.0–36.0)
MCV: 87.1 fL (ref 78.0–100.0)
Platelets: 315 10*3/uL (ref 150–400)
RBC: 5.12 MIL/uL (ref 4.22–5.81)
RDW: 12.7 % (ref 11.5–15.5)
WBC: 7.7 10*3/uL (ref 4.0–10.5)

## 2016-07-20 LAB — BASIC METABOLIC PANEL
ANION GAP: 10 (ref 5–15)
BUN: 10 mg/dL (ref 6–20)
CO2: 22 mmol/L (ref 22–32)
Calcium: 10.1 mg/dL (ref 8.9–10.3)
Chloride: 108 mmol/L (ref 101–111)
Creatinine, Ser: 0.74 mg/dL (ref 0.61–1.24)
Glucose, Bld: 148 mg/dL — ABNORMAL HIGH (ref 65–99)
POTASSIUM: 3.7 mmol/L (ref 3.5–5.1)
SODIUM: 140 mmol/L (ref 135–145)

## 2016-07-20 LAB — PROTIME-INR
INR: 1.04
Prothrombin Time: 13.6 seconds (ref 11.4–15.2)

## 2016-07-20 LAB — APTT: APTT: 27 s (ref 24–36)

## 2016-07-20 MED ORDER — SODIUM CHLORIDE 0.9 % IV SOLN: INTRAVENOUS | Status: DC

## 2016-07-20 MED ORDER — LIDOCAINE HCL 1 % IJ SOLN
INTRAMUSCULAR | Status: AC
  Filled 2016-07-20: qty 20

## 2016-07-20 MED ORDER — LIDOCAINE HCL 1 % IJ SOLN
INTRAMUSCULAR | Status: AC | PRN
  Administered 2016-07-20: 10 mL

## 2016-07-20 MED ORDER — FENTANYL CITRATE (PF) 100 MCG/2ML IJ SOLN
INTRAMUSCULAR | Status: AC | PRN
  Administered 2016-07-20: 25 ug via INTRAVENOUS

## 2016-07-20 MED ORDER — HYDRALAZINE HCL 20 MG/ML IJ SOLN
INTRAMUSCULAR | Status: AC
  Filled 2016-07-20: qty 1

## 2016-07-20 MED ORDER — HYDRALAZINE HCL 20 MG/ML IJ SOLN
INTRAMUSCULAR | Status: AC | PRN
  Administered 2016-07-20 (×2): 5 mg via INTRAVENOUS

## 2016-07-20 MED ORDER — SODIUM CHLORIDE 0.9 % IV SOLN: INTRAVENOUS | Status: AC

## 2016-07-20 MED ORDER — IOPAMIDOL (ISOVUE-300) INJECTION 61%
INTRAVENOUS | Status: AC
  Administered 2016-07-20: 90 mL
  Filled 2016-07-20: qty 150

## 2016-07-20 MED ORDER — MIDAZOLAM HCL 2 MG/2ML IJ SOLN
INTRAMUSCULAR | Status: AC | PRN
  Administered 2016-07-20: 1 mg via INTRAVENOUS

## 2016-07-20 MED ORDER — HEPARIN SODIUM (PORCINE) 1000 UNIT/ML IJ SOLN
INTRAMUSCULAR | Status: AC
  Filled 2016-07-20: qty 2

## 2016-07-20 MED ORDER — MIDAZOLAM HCL 2 MG/2ML IJ SOLN
INTRAMUSCULAR | Status: AC
  Filled 2016-07-20: qty 2

## 2016-07-20 MED ORDER — HEPARIN SODIUM (PORCINE) 1000 UNIT/ML IJ SOLN
INTRAMUSCULAR | Status: AC | PRN
  Administered 2016-07-20: 1500 [IU] via INTRAVENOUS

## 2016-07-20 MED ORDER — FENTANYL CITRATE (PF) 100 MCG/2ML IJ SOLN
INTRAMUSCULAR | Status: AC
  Filled 2016-07-20: qty 2

## 2016-07-20 NOTE — Discharge Instructions (Signed)
**Note De-identified Fluhr Obfuscation** Femoral Site Care °Refer to this sheet in the next few weeks. These instructions provide you with information about caring for yourself after your procedure. Your health care provider may also give you more specific instructions. Your treatment has been planned according to current medical practices, but problems sometimes occur. Call your health care provider if you have any problems or questions after your procedure. °What can I expect after the procedure? °After your procedure, it is typical to have the following: °· Bruising at the site that usually fades within 1-2 weeks. °· Blood collecting in the tissue (hematoma) that may be painful to the touch. It should usually decrease in size and tenderness within 1-2 weeks. °Follow these instructions at home: °· Take medicines only as directed by your health care provider. °· You may shower 24-48 hours after the procedure or as directed by your health care provider. Remove the bandage (dressing) and gently wash the site with plain soap and water. Pat the area dry with a clean towel. Do not rub the site, because this may cause bleeding. °· Do not take baths, swim, or use a hot tub until your health care provider approves. °· Check your insertion site every day for redness, swelling, or drainage. °· Do not apply powder or lotion to the site. °· Limit use of stairs to twice a day for the first 2-3 days or as directed by your health care provider. °· Do not squat for the first 2-3 days or as directed by your health care provider. °· Do not lift over 10 lb (4.5 kg) for 5 days after your procedure or as directed by your health care provider. °· Ask your health care provider when it is okay to: °¨ Return to work or school. °¨ Resume usual physical activities or sports. °¨ Resume sexual activity. °· Do not drive home if you are discharged the same day as the procedure. Have someone else drive you. °· You may drive 24 hours after the procedure unless otherwise instructed by  your health care provider. °· Do not operate machinery or power tools for 24 hours after the procedure or as directed by your health care provider. °· If your procedure was done as an outpatient procedure, which means that you went home the same day as your procedure, a responsible adult should be with you for the first 24 hours after you arrive home. °· Keep all follow-up visits as directed by your health care provider. This is important. °Contact a health care provider if: °· You have a fever. °· You have chills. °· You have increased bleeding from the site. Hold pressure on the site. °Get help right away if: °· You have unusual pain at the site. °· You have redness, warmth, or swelling at the site. °· You have drainage (other than a small amount of blood on the dressing) from the site. °· The site is bleeding, and the bleeding does not stop after 30 minutes of holding steady pressure on the site. °· Your leg or foot becomes pale, cool, tingly, or numb. °This information is not intended to replace advice given to you by your health care provider. Make sure you discuss any questions you have with your health care provider. °Document Released: 01/04/2014 Document Revised: 10/09/2015 Document Reviewed: 11/20/2013 °Elsevier Interactive Patient Education © 2017 Else[employer] Inc. ° °

## 2016-07-20 NOTE — Procedures (Signed)
**Note De-Identified Weltz Obfuscation** S/P 4 vessel cerebral arteriogram. RT CFA approach. Findings. 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

## 2016-07-20 NOTE — H&P (Signed)
**Note De-Identified [place] Obfuscation** Chief Complaint: Patient was seen in consultation today for cerebral arteriogram at the request of [place]  Referring Physician(s): Deveshwar,Sanjeev  Supervising Physician: Markus Daft  Patient Status: [facility] - Out-pt  History of Present Illness: Derek Donaldson is a 46 y.o. 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 He still has some residual fistula and he remains symptomatic. He has had follow up angiograms and MRI showing stability. He is here today for another follow up cerebral angiogram. PMHx, meds, labs, imaging reviewed. He's been NPO this am. He does have contrast allergy listed and has taken his pre-meds as prescribed. He feels well this am, no acute c/o or recent illness.  Past Medical History:  Diagnosis Date  . Asthma    Albuterol inhaler prn;just a small touch  . Cancer ([place]) 2010   skin ( unsure of the pathology)- removed  . Chiari malformation   . Essential hypertension, benign    takes Metoprolol,Diovan,and Amlodipine daily  . History of bronchitis   . History of kidney stones   . Mixed hyperlipidemia    takes Fenofibrate daily  . Muscle spasm    takes Flexeril daily as needed  . Numbness    left arm from chiari malformation  . Pneumonia 2005   hx of  . Shortness of breath    with exertion    Past Surgical History:  Procedure Laterality Date  . CEREBRAL ANGIOGRAM  12/2013  . CHOLECYSTECTOMY  11/13  . ERCP  03/21/2012   Procedure: ENDOSCOPIC RETROGRADE CHOLANGIOPANCREATOGRAPHY (ERCP);  Surgeon: Jeryl Columbia, MD;  Location: [facility];  Service: Endoscopy;  Laterality: N/A;  aqntibiotics and type and screen  . MRI     x 2  . RADIOLOGY WITH ANESTHESIA N/A 02/20/2014   Procedure: RADIOLOGY WITH ANESTHESIA EMBOLIZATION;  Surgeon: Rob Hickman, MD;  Location: [place];  Service: Radiology;  Laterality: N/A;  . RADIOLOGY WITH ANESTHESIA N/A 04/29/2014   Procedure: STAGE TWO EMBOLIZATION;  Surgeon:  Luanne Bras, MD;  Location: [place];  Service: Radiology;  Laterality: N/A;  . RADIOLOGY WITH ANESTHESIA N/A 06/05/2014   Procedure: Embolization;  Surgeon: Rob Hickman, MD;  Location: [place];  Service: Radiology;  Laterality: N/A;  . RADIOLOGY WITH ANESTHESIA N/A 07/15/2014   Procedure: RADIOLOGY WITH ANESTHESIA/EMBOLIZATION;  Surgeon: Rob Hickman, MD;  Location: [place];  Service: Radiology;  Laterality: N/A;  . STAPEDECTOMY  04/23/2003   Revision left stapedectomy  . SUBOCCIPITAL CRANIECTOMY CERVICAL LAMINECTOMY N/A 12/21/2013   Procedure: Aborted Cranioplasty For Chiari Malformation (Could not complete procedure);  Surgeon: Ashok Pall, MD;  Location: [facility];  Service: Neurosurgery;  Laterality: N/A;  Aborted Cranioplasty For Chiari Malformation  . VASECTOMY      Allergies: Contrast media [iodinated diagnostic agents] and Shellfish allergy  Medications: Prior to Admission medications   Medication Sig Start Date End Date Taking? Authorizing Provider  amLODipine (NORVASC) 10 MG tablet Take 10 mg by mouth daily before breakfast.    Yes Historical Provider, MD  aspirin EC 81 MG tablet Take 81 mg by mouth daily.   Yes Historical Provider, MD  EPINEPHrine 0.3 mg/0.3 mL IJ SOAJ injection Inject 0.3 mg into the muscle as needed (emergent allergic reaction).   Yes Historical Provider, MD  fenofibrate micronized (ANTARA) 130 MG capsule Take 130 mg by mouth daily before breakfast.   Yes Historical Provider, MD  metoprolol succinate (TOPROL-XL) 25 MG 24 hr tablet Take 25 mg **Note De-Identified Aloi Obfuscation** by mouth daily.   Yes Historical Provider, MD  potassium chloride SA (K-DUR,KLOR-CON) 20 MEQ tablet Take 1 tablet (20 mEq total) by mouth daily. 10/20/15  Yes Milton Ferguson, MD  valsartan-hydrochlorothiazide (DIOVAN-HCT) 320-25 MG per tablet Take 1 tablet by mouth daily before breakfast.    Yes Historical Provider, MD  albuterol (PROVENTIL HFA;VENTOLIN HFA) 108 (90 BASE) MCG/ACT inhaler Inhale 2 puffs into the  lungs every 4 (four) hours as needed for wheezing or shortness of breath.    Historical Provider, MD  ibuprofen (ADVIL,MOTRIN) 200 MG tablet Take 400 mg by mouth every 6 (six) hours as needed for mild pain or moderate pain.    Historical Provider, MD     Family History  Problem Relation Age of Onset  . Stroke Paternal Grandfather   . Cancer Maternal Grandfather     unknown type  . Asthma Mother   . Asthma Brother   . Heart failure Maternal Grandmother   . Aneurysm Father     Social History   Social History  . Marital status: Married    Spouse name: N/A  . Number of children: N/A  . Years of education: N/A   Social History Main Topics  . Smoking status: Never Smoker  . Smokeless tobacco: Never Used  . Alcohol use No  . Drug use: No  . Sexual activity: Yes   Other Topics Concern  . Not on file   Social History Narrative  . No narrative on file    Review of Systems: A 12 point ROS discussed and pertinent positives are indicated in the HPI above.  All other systems are negative.  Review of Systems  Vital Signs: BP (!) 166/103   Temp 98.1 F (36.7 C) (Oral)   Resp 16   Ht 5\' 11"  (1.803 m)   Wt 297 lb (134.7 kg)   SpO2 97%   BMI 41.42 kg/m   Physical Exam  Constitutional: He is oriented to person, place, and time. He appears well-developed and well-nourished. No distress.  HENT:  Head: Normocephalic.  Mouth/Throat: Oropharynx is clear and moist.  Neck: Normal range of motion. No JVD present. No tracheal deviation present.  Cardiovascular: Normal rate, regular rhythm, normal heart sounds and intact distal pulses.   Pulmonary/Chest: Effort normal and breath sounds normal. No respiratory distress.  Abdominal: Soft. There is no tenderness.  Musculoskeletal: Normal range of motion.  Neurological: He is alert and oriented to person, place, and time. No cranial nerve deficit or sensory deficit. Coordination normal.  Skin: Skin is warm and dry.  Psychiatric: He has a  normal mood and affect. Judgment normal.    Mallampati Score:  MD Evaluation Airway: WNL Heart: WNL Abdomen: WNL Chest/ Lungs: WNL ASA  Classification: 2 Mallampati/Airway Score: One  Imaging: No results found.  Labs:  CBC:  Recent Labs  10/20/15 1524 07/20/16 0625  WBC 10.1 7.7  HGB 13.4 15.4  HCT 40.2 44.6  PLT 267 315    COAGS:  Recent Labs  07/20/16 0625  INR 1.04  APTT 27    BMP:  Recent Labs  10/20/15 1524 07/20/16 0625  NA 137 140  K 3.2* 3.7  CL 104 108  CO2 26 22  GLUCOSE 88 148*  BUN 11 10  CALCIUM 9.0 10.1  CREATININE 0.72 0.74  GFRNONAA >60 >60  GFRAA >60 >60    LIVER FUNCTION TESTS:  Recent Labs  10/20/15 1524  BILITOT 0.4  AST 16  ALT 22  ALKPHOS 38 **Note De-Identified [place] Obfuscation** PROT 7.1  ALBUMIN 4.2    TUMOR MARKERS: No results for input(s): AFPTM, CEA, CA199, CHROMGRNA in the last 8760 hours.  Assessment and Plan: Left sided dural AVF Plan for cerebral angiogram today Labs ok Risks and Benefits discussed with the patient including, but not limited to bleeding, infection, vascular injury or contrast induced renal failure. All of the patient's questions were answered, patient is agreeable to proceed. Consent signed and in chart.   Thank you for this interesting consult.  I greatly enjoyed meeting Derek Donaldson and look forward to participating in their care.  A copy of this report was sent to the requesting provider on this date.  Electronically Signed: Ascencion Dike 07/20/2016, 7:16 AM   I spent a total of 20 minutes in face to face in clinical consultation, greater than 50% of which was counseling/coordinating care for cerebral angiogram

## 2016-07-21 ENCOUNTER — Encounter (HOSPITAL_COMMUNITY): Payer: Self-pay | Admitting: Interventional Radiology

## 2016-08-03 ENCOUNTER — Other Ambulatory Visit (HOSPITAL_COMMUNITY): Payer: Self-pay | Admitting: Interventional Radiology

## 2016-08-03 DIAGNOSIS — I671 Cerebral aneurysm, nonruptured: Secondary | ICD-10-CM

## 2016-08-04 ENCOUNTER — Ambulatory Visit (HOSPITAL_COMMUNITY)
Admission: RE | Admit: 2016-08-04 | Discharge: 2016-08-04 | Disposition: A | Discharge status: Home / Self Care | Payer: BLUE CROSS/BLUE SHIELD | Source: Ambulatory Visit | Attending: Interventional Radiology | Admitting: Interventional Radiology

## 2016-08-04 ENCOUNTER — Encounter (HOSPITAL_COMMUNITY): Payer: Self-pay

## 2016-08-04 DIAGNOSIS — I671 Cerebral aneurysm, nonruptured: Secondary | ICD-10-CM

## 2016-09-08 ENCOUNTER — Telehealth (HOSPITAL_COMMUNITY): Payer: Self-pay | Admitting: Radiology

## 2016-09-08 NOTE — Telephone Encounter (Signed)
**Note De-Identified [place]ardo Obfuscation** Called pt, he reports that he has not seen gotten a call from Dr. Patsey Berthold at [facility] as of yet for his appointment. I told him I would contact their office today and request them to call him ASAP. I did email Dr. Patsey Berthold and his assistant Ruthann Cancer requesting that they contact Derek Donaldson for appt. He will let me know if he has not heard from them by the end of the week. JM

## 2016-12-07 ENCOUNTER — Other Ambulatory Visit: Payer: Self-pay | Admitting: Neurosurgery

## 2016-12-08 ENCOUNTER — Encounter (HOSPITAL_COMMUNITY): Payer: Self-pay | Admitting: *Deleted

## 2016-12-09 ENCOUNTER — Inpatient Hospital Stay (HOSPITAL_COMMUNITY): Payer: BLUE CROSS/BLUE SHIELD | Admitting: Certified Registered Nurse Anesthetist

## 2016-12-09 ENCOUNTER — Encounter (HOSPITAL_COMMUNITY)
Admission: RE | Disposition: A | Discharge status: Home / Self Care | Payer: Self-pay | Source: Ambulatory Visit | Attending: Neurosurgery

## 2016-12-09 ENCOUNTER — Encounter (HOSPITAL_COMMUNITY): Payer: Self-pay | Admitting: *Deleted

## 2016-12-09 ENCOUNTER — Inpatient Hospital Stay (HOSPITAL_COMMUNITY)
Admission: RE | Admit: 2016-12-09 | Discharge: 2016-12-11 | DRG: 027 | Disposition: A | Discharge status: Home / Self Care | Payer: BLUE CROSS/BLUE SHIELD | Source: Ambulatory Visit | Attending: Neurosurgery | Admitting: Neurosurgery

## 2016-12-09 DIAGNOSIS — Z85828 Personal history of other malignant neoplasm of skin: Secondary | ICD-10-CM | POA: Diagnosis not present

## 2016-12-09 DIAGNOSIS — E782 Mixed hyperlipidemia: Secondary | ICD-10-CM | POA: Diagnosis present

## 2016-12-09 DIAGNOSIS — K219 Gastro-esophageal reflux disease without esophagitis: Secondary | ICD-10-CM | POA: Diagnosis present

## 2016-12-09 DIAGNOSIS — G935 Compression of brain: Principal | ICD-10-CM | POA: Diagnosis present

## 2016-12-09 DIAGNOSIS — I10 Essential (primary) hypertension: Secondary | ICD-10-CM | POA: Diagnosis present

## 2016-12-09 DIAGNOSIS — J45909 Unspecified asthma, uncomplicated: Secondary | ICD-10-CM | POA: Diagnosis present

## 2016-12-09 HISTORY — PX: SUBOCCIPITAL CRANIECTOMY CERVICAL LAMINECTOMY: SHX5404

## 2016-12-09 HISTORY — DX: Headache: R51

## 2016-12-09 HISTORY — DX: Headache, unspecified: R51.9

## 2016-12-09 LAB — POCT I-STAT 7, (LYTES, BLD GAS, ICA,H+H)
ACID-BASE DEFICIT: 4 mmol/L — AB (ref 0.0–2.0)
Acid-base deficit: 4 mmol/L — ABNORMAL HIGH (ref 0.0–2.0)
BICARBONATE: 22.2 mmol/L (ref 20.0–28.0)
BICARBONATE: 22.2 mmol/L (ref 20.0–28.0)
CALCIUM ION: 1.16 mmol/L (ref 1.15–1.40)
CALCIUM ION: 1.16 mmol/L (ref 1.15–1.40)
HCT: 33 % — ABNORMAL LOW (ref 39.0–52.0)
HCT: 30 % — ABNORMAL LOW (ref 39.0–52.0)
HEMOGLOBIN: 11.2 g/dL — AB (ref 13.0–17.0)
Hemoglobin: 10.2 g/dL — ABNORMAL LOW (ref 13.0–17.0)
O2 SAT: 97 %
O2 Saturation: 97 %
PH ART: 7.305 — AB (ref 7.350–7.450)
PH ART: 7.297 — AB (ref 7.350–7.450)
PO2 ART: 97 mmHg (ref 83.0–108.0)
Potassium: 3.6 mmol/L (ref 3.5–5.1)
Potassium: 3.9 mmol/L (ref 3.5–5.1)
SODIUM: 145 mmol/L (ref 135–145)
SODIUM: 145 mmol/L (ref 135–145)
TCO2: 24 mmol/L (ref 0–100)
TCO2: 24 mmol/L (ref 0–100)
pCO2 arterial: 44.3 mmHg (ref 32.0–48.0)
pCO2 arterial: 45.3 mmHg (ref 32.0–48.0)
pO2, Arterial: 96 mmHg (ref 83.0–108.0)

## 2016-12-09 LAB — CBC
HCT: 40.4 % (ref 39.0–52.0)
Hemoglobin: 13.7 g/dL (ref 13.0–17.0)
MCH: 30 pg (ref 26.0–34.0)
MCHC: 33.9 g/dL (ref 30.0–36.0)
MCV: 88.6 fL (ref 78.0–100.0)
PLATELETS: 251 10*3/uL (ref 150–400)
RBC: 4.56 MIL/uL (ref 4.22–5.81)
RDW: 13.1 % (ref 11.5–15.5)
WBC: 7.8 10*3/uL (ref 4.0–10.5)

## 2016-12-09 LAB — BASIC METABOLIC PANEL
Anion gap: 9 (ref 5–15)
BUN: 13 mg/dL (ref 6–20)
CALCIUM: 9.3 mg/dL (ref 8.9–10.3)
CO2: 25 mmol/L (ref 22–32)
CREATININE: 0.75 mg/dL (ref 0.61–1.24)
Chloride: 106 mmol/L (ref 101–111)
GFR calc Af Amer: >60 mL/min (ref >60)
Glucose, Bld: 102 mg/dL — ABNORMAL HIGH (ref 65–99)
Potassium: 3.7 mmol/L (ref 3.5–5.1)
SODIUM: 140 mmol/L (ref 135–145)

## 2016-12-09 LAB — PREPARE RBC (CROSSMATCH)

## 2016-12-09 SURGERY — SUBOCCIPITAL CRANIECTOMY CERVICAL LAMINECTOMY/DURAPLASTY
Anesthesia: General | Site: Head

## 2016-12-09 MED ORDER — MAGNESIUM CITRATE PO SOLN: 1.0000 | Freq: Once | ORAL | Status: DC | PRN

## 2016-12-09 MED ORDER — FENOFIBRATE 160 MG PO TABS
160.0000 mg | ORAL_TABLET | Freq: Every day | ORAL | Status: DC
  Administered 2016-12-09 – 2016-12-11 (×3): 160 mg via ORAL
  Filled 2016-12-09 (×3): qty 1

## 2016-12-09 MED ORDER — SODIUM CHLORIDE 0.9 % IV SOLN
INTRAVENOUS | Status: DC | PRN
  Administered 2016-12-09 (×2): via INTRAVENOUS

## 2016-12-09 MED ORDER — THROMBIN 20000 UNITS EX SOLR
CUTANEOUS | Status: DC | PRN
  Administered 2016-12-09: 20 mL via TOPICAL

## 2016-12-09 MED ORDER — EPHEDRINE 5 MG/ML INJ
INTRAVENOUS | Status: AC
  Filled 2016-12-09: qty 10

## 2016-12-09 MED ORDER — DEXAMETHASONE 4 MG PO TABS: 4.0000 mg | ORAL_TABLET | Freq: Three times a day (TID) | ORAL | Status: DC

## 2016-12-09 MED ORDER — PHENYLEPHRINE 40 MCG/ML (10ML) SYRINGE FOR IV PUSH (FOR BLOOD PRESSURE SUPPORT)
PREFILLED_SYRINGE | INTRAVENOUS | Status: AC
  Filled 2016-12-09: qty 10

## 2016-12-09 MED ORDER — PROMETHAZINE HCL 12.5 MG PO TABS
12.5000 mg | ORAL_TABLET | ORAL | Status: DC | PRN
  Filled 2016-12-09: qty 2

## 2016-12-09 MED ORDER — 0.9 % SODIUM CHLORIDE (POUR BTL) OPTIME
TOPICAL | Status: DC | PRN
  Administered 2016-12-09: 3000 mL

## 2016-12-09 MED ORDER — LIDOCAINE 2% (20 MG/ML) 5 ML SYRINGE
INTRAMUSCULAR | Status: AC
  Filled 2016-12-09: qty 10

## 2016-12-09 MED ORDER — SODIUM CHLORIDE 0.9 % IV SOLN
INTRAVENOUS | Status: DC | PRN
  Administered 2016-12-09 (×3): via INTRAVENOUS

## 2016-12-09 MED ORDER — ROCURONIUM BROMIDE 100 MG/10ML IV SOLN
INTRAVENOUS | Status: DC | PRN
  Administered 2016-12-09: 50 mg via INTRAVENOUS
  Administered 2016-12-09: 30 mg via INTRAVENOUS
  Administered 2016-12-09: 50 mg via INTRAVENOUS
  Administered 2016-12-09: 30 mg via INTRAVENOUS

## 2016-12-09 MED ORDER — ALBUTEROL SULFATE (2.5 MG/3ML) 0.083% IN NEBU: 2.5000 mg | INHALATION_SOLUTION | RESPIRATORY_TRACT | Status: DC | PRN

## 2016-12-09 MED ORDER — ASPIRIN EC 81 MG PO TBEC
81.0000 mg | DELAYED_RELEASE_TABLET | Freq: Every day | ORAL | Status: DC
  Administered 2016-12-10 – 2016-12-11 (×2): 81 mg via ORAL
  Filled 2016-12-09 (×2): qty 1

## 2016-12-09 MED ORDER — DEXAMETHASONE SODIUM PHOSPHATE 10 MG/ML IJ SOLN
INTRAMUSCULAR | Status: AC
  Filled 2016-12-09: qty 1

## 2016-12-09 MED ORDER — THROMBIN 20000 UNITS EX SOLR
CUTANEOUS | Status: AC
  Filled 2016-12-09: qty 20000

## 2016-12-09 MED ORDER — CEFAZOLIN SODIUM-DEXTROSE 1-4 GM/50ML-% IV SOLN
INTRAVENOUS | Status: DC | PRN
  Administered 2016-12-09: 3 g via INTRAVENOUS

## 2016-12-09 MED ORDER — ONDANSETRON HCL 4 MG/2ML IJ SOLN: 4.0000 mg | INTRAMUSCULAR | Status: DC | PRN

## 2016-12-09 MED ORDER — MIDAZOLAM HCL 2 MG/2ML IJ SOLN
INTRAMUSCULAR | Status: AC
  Filled 2016-12-09: qty 2

## 2016-12-09 MED ORDER — POTASSIUM CHLORIDE IN NACL 20-0.9 MEQ/L-% IV SOLN
INTRAVENOUS | Status: DC
  Administered 2016-12-09 – 2016-12-10 (×2): via INTRAVENOUS
  Filled 2016-12-09 (×4): qty 1000

## 2016-12-09 MED ORDER — DEXAMETHASONE SODIUM PHOSPHATE 10 MG/ML IJ SOLN
INTRAMUSCULAR | Status: DC | PRN
  Administered 2016-12-09: 40 mg via INTRAVENOUS
  Administered 2016-12-09: 10 mg via INTRAVENOUS

## 2016-12-09 MED ORDER — HYDROMORPHONE HCL 1 MG/ML IJ SOLN
INTRAMUSCULAR | Status: AC
  Administered 2016-12-09: 0.5 mg via INTRAVENOUS
  Filled 2016-12-09: qty 1

## 2016-12-09 MED ORDER — SUCCINYLCHOLINE CHLORIDE 200 MG/10ML IV SOSY
PREFILLED_SYRINGE | INTRAVENOUS | Status: AC
  Filled 2016-12-09: qty 10

## 2016-12-09 MED ORDER — PHENYLEPHRINE HCL 10 MG/ML IJ SOLN
INTRAMUSCULAR | Status: DC | PRN
  Administered 2016-12-09 (×2): 40 ug via INTRAVENOUS
  Administered 2016-12-09: 80 ug via INTRAVENOUS

## 2016-12-09 MED ORDER — ONDANSETRON HCL 4 MG/2ML IJ SOLN
INTRAMUSCULAR | Status: AC
  Filled 2016-12-09: qty 2

## 2016-12-09 MED ORDER — HEMOSTATIC AGENTS (NO CHARGE) OPTIME
TOPICAL | Status: DC | PRN
  Administered 2016-12-09: 1 via TOPICAL

## 2016-12-09 MED ORDER — NALOXONE HCL 0.4 MG/ML IJ SOLN: 0.0800 mg | INTRAMUSCULAR | Status: DC | PRN

## 2016-12-09 MED ORDER — DEXAMETHASONE 4 MG PO TABS
4.0000 mg | ORAL_TABLET | Freq: Four times a day (QID) | ORAL | Status: AC
  Administered 2016-12-10 – 2016-12-11 (×4): 4 mg via ORAL
  Filled 2016-12-09 (×4): qty 1

## 2016-12-09 MED ORDER — FENTANYL CITRATE (PF) 250 MCG/5ML IJ SOLN
INTRAMUSCULAR | Status: AC
  Filled 2016-12-09: qty 5

## 2016-12-09 MED ORDER — POTASSIUM CHLORIDE CRYS ER 20 MEQ PO TBCR
20.0000 meq | EXTENDED_RELEASE_TABLET | Freq: Every day | ORAL | Status: DC
  Administered 2016-12-09 – 2016-12-11 (×3): 20 meq via ORAL
  Filled 2016-12-09 (×3): qty 1

## 2016-12-09 MED ORDER — FENTANYL CITRATE (PF) 100 MCG/2ML IJ SOLN
INTRAMUSCULAR | Status: DC | PRN
  Administered 2016-12-09: 200 ug via INTRAVENOUS
  Administered 2016-12-09: 50 ug via INTRAVENOUS
  Administered 2016-12-09: 25 ug via INTRAVENOUS
  Administered 2016-12-09: 50 ug via INTRAVENOUS

## 2016-12-09 MED ORDER — SUGAMMADEX SODIUM 500 MG/5ML IV SOLN
INTRAVENOUS | Status: DC | PRN
  Administered 2016-12-09: 50 mg via INTRAVENOUS
  Administered 2016-12-09: 300 mg via INTRAVENOUS

## 2016-12-09 MED ORDER — AMLODIPINE BESYLATE 10 MG PO TABS
10.0000 mg | ORAL_TABLET | Freq: Every day | ORAL | Status: DC
  Administered 2016-12-10 – 2016-12-11 (×2): 10 mg via ORAL
  Filled 2016-12-09 (×2): qty 1

## 2016-12-09 MED ORDER — ONDANSETRON HCL 4 MG PO TABS: 4.0000 mg | ORAL_TABLET | ORAL | Status: DC | PRN

## 2016-12-09 MED ORDER — LABETALOL HCL 5 MG/ML IV SOLN: 10.0000 mg | INTRAVENOUS | Status: DC | PRN

## 2016-12-09 MED ORDER — PROPOFOL 10 MG/ML IV BOLUS
INTRAVENOUS | Status: AC
  Filled 2016-12-09: qty 20

## 2016-12-09 MED ORDER — ACETAMINOPHEN 650 MG RE SUPP: 650.0000 mg | RECTAL | Status: DC | PRN

## 2016-12-09 MED ORDER — OXYCODONE HCL 5 MG/5ML PO SOLN: 5.0000 mg | Freq: Once | ORAL | Status: DC | PRN

## 2016-12-09 MED ORDER — PROPOFOL 10 MG/ML IV BOLUS
INTRAVENOUS | Status: DC | PRN
  Administered 2016-12-09: 50 mg via INTRAVENOUS
  Administered 2016-12-09: 200 mg via INTRAVENOUS
  Administered 2016-12-09: 30 mg via INTRAVENOUS
  Administered 2016-12-09: 40 mg via INTRAVENOUS

## 2016-12-09 MED ORDER — DEXAMETHASONE 4 MG PO TABS
6.0000 mg | ORAL_TABLET | Freq: Four times a day (QID) | ORAL | Status: AC
  Administered 2016-12-09 – 2016-12-10 (×4): 6 mg via ORAL
  Filled 2016-12-09 (×4): qty 1

## 2016-12-09 MED ORDER — PANTOPRAZOLE SODIUM 40 MG IV SOLR
40.0000 mg | Freq: Every day | INTRAVENOUS | Status: DC
  Administered 2016-12-09: 40 mg via INTRAVENOUS
  Filled 2016-12-09: qty 40

## 2016-12-09 MED ORDER — SODIUM CHLORIDE 0.9 % IV SOLN: 10.0000 mL/h | Freq: Once | INTRAVENOUS | Status: DC

## 2016-12-09 MED ORDER — LIDOCAINE-EPINEPHRINE 0.5 %-1:200000 IJ SOLN
INTRAMUSCULAR | Status: DC | PRN
  Administered 2016-12-09: 10 mL

## 2016-12-09 MED ORDER — CEFAZOLIN SODIUM-DEXTROSE 1-4 GM/50ML-% IV SOLN: INTRAVENOUS | Status: DC | PRN

## 2016-12-09 MED ORDER — ACETAMINOPHEN 325 MG PO TABS
650.0000 mg | ORAL_TABLET | ORAL | Status: DC | PRN
  Administered 2016-12-09 – 2016-12-10 (×2): 650 mg via ORAL
  Filled 2016-12-09: qty 2

## 2016-12-09 MED ORDER — DOCUSATE SODIUM 100 MG PO CAPS
100.0000 mg | ORAL_CAPSULE | Freq: Two times a day (BID) | ORAL | Status: DC
  Administered 2016-12-09 – 2016-12-11 (×4): 100 mg via ORAL
  Filled 2016-12-09 (×5): qty 1

## 2016-12-09 MED ORDER — OXYCODONE HCL 5 MG PO TABS: 5.0000 mg | ORAL_TABLET | Freq: Once | ORAL | Status: DC | PRN

## 2016-12-09 MED ORDER — HYDROCODONE-ACETAMINOPHEN 5-325 MG PO TABS: 1.0000 | ORAL_TABLET | ORAL | Status: DC | PRN

## 2016-12-09 MED ORDER — EPINEPHRINE 0.3 MG/0.3ML IJ SOAJ: 0.3000 mg | INTRAMUSCULAR | Status: DC | PRN

## 2016-12-09 MED ORDER — ALBUMIN HUMAN 5 % IV SOLN
INTRAVENOUS | Status: DC | PRN
  Administered 2016-12-09 (×3): via INTRAVENOUS

## 2016-12-09 MED ORDER — LIDOCAINE 2% (20 MG/ML) 5 ML SYRINGE
INTRAMUSCULAR | Status: AC
  Filled 2016-12-09: qty 5

## 2016-12-09 MED ORDER — ACETAMINOPHEN 325 MG PO TABS
ORAL_TABLET | ORAL | Status: AC
  Filled 2016-12-09: qty 2

## 2016-12-09 MED ORDER — VALSARTAN-HYDROCHLOROTHIAZIDE 320-25 MG PO TABS: 1.0000 | ORAL_TABLET | Freq: Every day | ORAL | Status: DC

## 2016-12-09 MED ORDER — BACITRACIN ZINC 500 UNIT/GM EX OINT
TOPICAL_OINTMENT | CUTANEOUS | Status: AC
  Filled 2016-12-09: qty 28.35

## 2016-12-09 MED ORDER — HYDROMORPHONE HCL 1 MG/ML IJ SOLN
0.2500 mg | INTRAMUSCULAR | Status: DC | PRN
  Administered 2016-12-09 (×4): 0.5 mg via INTRAVENOUS

## 2016-12-09 MED ORDER — IRBESARTAN 300 MG PO TABS
300.0000 mg | ORAL_TABLET | Freq: Every day | ORAL | Status: DC
  Administered 2016-12-09 – 2016-12-11 (×3): 300 mg via ORAL
  Filled 2016-12-09: qty 2
  Filled 2016-12-09: qty 1
  Filled 2016-12-09: qty 2

## 2016-12-09 MED ORDER — LACTATED RINGERS IV SOLN
INTRAVENOUS | Status: DC | PRN
  Administered 2016-12-09: 11:00:00 via INTRAVENOUS

## 2016-12-09 MED ORDER — BISACODYL 5 MG PO TBEC: 5.0000 mg | DELAYED_RELEASE_TABLET | Freq: Every day | ORAL | Status: DC | PRN

## 2016-12-09 MED ORDER — ONDANSETRON HCL 4 MG/2ML IJ SOLN
INTRAMUSCULAR | Status: DC | PRN
  Administered 2016-12-09: 4 mg via INTRAVENOUS

## 2016-12-09 MED ORDER — LIDOCAINE-EPINEPHRINE 0.5 %-1:200000 IJ SOLN
INTRAMUSCULAR | Status: AC
  Filled 2016-12-09: qty 1

## 2016-12-09 MED ORDER — PHENYLEPHRINE HCL 10 MG/ML IJ SOLN
INTRAVENOUS | Status: DC | PRN
  Administered 2016-12-09: 15 ug/min via INTRAVENOUS

## 2016-12-09 MED ORDER — LIDOCAINE HCL (CARDIAC) 20 MG/ML IV SOLN
INTRAVENOUS | Status: DC | PRN
  Administered 2016-12-09: 80 mg via INTRAVENOUS

## 2016-12-09 MED ORDER — BACITRACIN ZINC 500 UNIT/GM EX OINT
TOPICAL_OINTMENT | CUTANEOUS | Status: DC | PRN
  Administered 2016-12-09: 1 via TOPICAL

## 2016-12-09 MED ORDER — MORPHINE SULFATE (PF) 2 MG/ML IV SOLN
2.0000 mg | INTRAVENOUS | Status: DC | PRN
Start: 2016-12-09 — End: 2016-12-09

## 2016-12-09 MED ORDER — HYDROCHLOROTHIAZIDE 25 MG PO TABS
25.0000 mg | ORAL_TABLET | Freq: Every day | ORAL | Status: DC
  Administered 2016-12-09 – 2016-12-11 (×3): 25 mg via ORAL
  Filled 2016-12-09 (×3): qty 1

## 2016-12-09 MED ORDER — KETOROLAC TROMETHAMINE 30 MG/ML IJ SOLN
30.0000 mg | Freq: Four times a day (QID) | INTRAMUSCULAR | Status: DC
  Administered 2016-12-09 – 2016-12-11 (×8): 30 mg via INTRAVENOUS
  Filled 2016-12-09 (×8): qty 1

## 2016-12-09 MED ORDER — ONDANSETRON HCL 4 MG/2ML IJ SOLN: 4.0000 mg | Freq: Four times a day (QID) | INTRAMUSCULAR | Status: DC | PRN

## 2016-12-09 MED ORDER — MORPHINE SULFATE (PF) 4 MG/ML IV SOLN: 1.0000 mg | INTRAVENOUS | Status: DC | PRN

## 2016-12-09 MED ORDER — MIDAZOLAM HCL 5 MG/5ML IJ SOLN
INTRAMUSCULAR | Status: DC | PRN
  Administered 2016-12-09: 2 mg via INTRAVENOUS

## 2016-12-09 MED ORDER — METOPROLOL SUCCINATE ER 25 MG PO TB24
25.0000 mg | ORAL_TABLET | Freq: Every day | ORAL | Status: DC
  Administered 2016-12-10 – 2016-12-11 (×2): 25 mg via ORAL
  Filled 2016-12-09 (×2): qty 1

## 2016-12-09 MED ORDER — SENNOSIDES-DOCUSATE SODIUM 8.6-50 MG PO TABS: 1.0000 | ORAL_TABLET | Freq: Every evening | ORAL | Status: DC | PRN

## 2016-12-09 MED ORDER — OXYCODONE HCL 5 MG PO TABS
ORAL_TABLET | ORAL | Status: AC
  Filled 2016-12-09: qty 2

## 2016-12-09 MED ORDER — OXYCODONE HCL 5 MG PO TABS
5.0000 mg | ORAL_TABLET | ORAL | Status: DC | PRN
  Administered 2016-12-09: 10 mg via ORAL

## 2016-12-09 MED ORDER — ROCURONIUM BROMIDE 10 MG/ML (PF) SYRINGE
PREFILLED_SYRINGE | INTRAVENOUS | Status: AC
  Filled 2016-12-09: qty 5

## 2016-12-09 SURGICAL SUPPLY — 88 items
BENZOIN TINCTURE PRP APPL 2/3 (GAUZE/BANDAGES/DRESSINGS) IMPLANT
BLADE 15 SAFETY STRL DISP (BLADE) ×6 IMPLANT
BLADE CLIPPER SURG (BLADE) ×3 IMPLANT
BLADE ULTRA TIP 2M (BLADE) IMPLANT
BUR ACORN 6.0 PRECISION (BURR) ×2 IMPLANT
BUR ACORN 6.0MM PRECISION (BURR) ×1
BUR PRECISION FLUTE 5.0 (BURR) ×3 IMPLANT
CANISTER SUCT 3000ML PPV (MISCELLANEOUS) ×6 IMPLANT
CARTRIDGE OIL MAESTRO DRILL (MISCELLANEOUS) ×1 IMPLANT
CLIP TI MEDIUM 6 (CLIP) ×6 IMPLANT
CLIP VESOCCLUDE MED 6/CT (CLIP) IMPLANT
CORD BIPOLAR FORCEPS 12FT (ELECTRODE) IMPLANT
COVER BACK TABLE 60X90IN (DRAPES) ×3 IMPLANT
DECANTER SPIKE VIAL GLASS SM (MISCELLANEOUS) ×3 IMPLANT
DIFFUSER DRILL AIR PNEUMATIC (MISCELLANEOUS) ×3 IMPLANT
DRAPE LAPAROTOMY 100X72 PEDS (DRAPES) ×3 IMPLANT
DRAPE MICROSCOPE LEICA (MISCELLANEOUS) ×3 IMPLANT
DRAPE WARM FLUID 44X44 (DRAPE) ×3 IMPLANT
DRSG OPSITE POSTOP 4X8 (GAUZE/BANDAGES/DRESSINGS) ×3 IMPLANT
DURAGUARD 04CMX04CM ×3 IMPLANT
DURAPREP 6ML APPLICATOR 50/CS (WOUND CARE) ×6 IMPLANT
ELECT CAUTERY BLADE 6.4 (BLADE) ×3 IMPLANT
ELECT REM PT RETURN 9FT ADLT (ELECTROSURGICAL) ×3
ELECTRODE REM PT RTRN 9FT ADLT (ELECTROSURGICAL) ×1 IMPLANT
EVACUATOR 1/8 PVC DRAIN (DRAIN) IMPLANT
EVACUATOR SILICONE 100CC (DRAIN) IMPLANT
GAUZE SPONGE 4X4 12PLY STRL (GAUZE/BANDAGES/DRESSINGS) IMPLANT
GAUZE SPONGE 4X4 12PLY STRL LF (GAUZE/BANDAGES/DRESSINGS) ×3 IMPLANT
GAUZE SPONGE 4X4 16PLY XRAY LF (GAUZE/BANDAGES/DRESSINGS) IMPLANT
GLOVE BIO SURGEON STRL SZ 6.5 (GLOVE) IMPLANT
GLOVE BIO SURGEON STRL SZ7 (GLOVE) IMPLANT
GLOVE BIO SURGEON STRL SZ7.5 (GLOVE) IMPLANT
GLOVE BIO SURGEON STRL SZ8 (GLOVE) IMPLANT
GLOVE BIO SURGEON STRL SZ8.5 (GLOVE) IMPLANT
GLOVE BIO SURGEONS STRL SZ 6.5 (GLOVE)
GLOVE BIOGEL M 8.0 STRL (GLOVE) IMPLANT
GLOVE BIOGEL PI IND STRL 7.5 (GLOVE) ×1 IMPLANT
GLOVE BIOGEL PI INDICATOR 7.5 (GLOVE) ×2
GLOVE ECLIPSE 6.5 STRL STRAW (GLOVE) ×6 IMPLANT
GLOVE ECLIPSE 7.0 STRL STRAW (GLOVE) ×3 IMPLANT
GLOVE ECLIPSE 7.5 STRL STRAW (GLOVE) IMPLANT
GLOVE ECLIPSE 8.0 STRL XLNG CF (GLOVE) IMPLANT
GLOVE ECLIPSE 8.5 STRL (GLOVE) IMPLANT
GLOVE EXAM NITRILE LRG STRL (GLOVE) IMPLANT
GLOVE EXAM NITRILE XL STR (GLOVE) IMPLANT
GLOVE EXAM NITRILE XS STR PU (GLOVE) IMPLANT
GLOVE INDICATOR 6.5 STRL GRN (GLOVE) IMPLANT
GLOVE INDICATOR 7.0 STRL GRN (GLOVE) ×3 IMPLANT
GLOVE INDICATOR 7.5 STRL GRN (GLOVE) IMPLANT
GLOVE INDICATOR 8.0 STRL GRN (GLOVE) IMPLANT
GLOVE INDICATOR 8.5 STRL (GLOVE) IMPLANT
GLOVE OPTIFIT SS 8.0 STRL (GLOVE) IMPLANT
GLOVE SURG SS PI 6.5 STRL IVOR (GLOVE) IMPLANT
GLOVE SURG SS PI 7.0 STRL IVOR (GLOVE) ×3 IMPLANT
GOWN STRL REUS W/ TWL LRG LVL3 (GOWN DISPOSABLE) ×3 IMPLANT
GOWN STRL REUS W/ TWL XL LVL3 (GOWN DISPOSABLE) IMPLANT
GOWN STRL REUS W/TWL 2XL LVL3 (GOWN DISPOSABLE) IMPLANT
GOWN STRL REUS W/TWL LRG LVL3 (GOWN DISPOSABLE) ×6
GOWN STRL REUS W/TWL XL LVL3 (GOWN DISPOSABLE)
HEMOSTAT SURGICEL 2X14 (HEMOSTASIS) IMPLANT
KIT BASIN OR (CUSTOM PROCEDURE TRAY) ×3 IMPLANT
KIT ROOM TURNOVER OR (KITS) ×3 IMPLANT
NEEDLE HYPO 25X1 1.5 SAFETY (NEEDLE) ×3 IMPLANT
NS IRRIG 1000ML POUR BTL (IV SOLUTION) ×3 IMPLANT
OIL CARTRIDGE MAESTRO DRILL (MISCELLANEOUS) ×3
PACK CRANIOTOMY (CUSTOM PROCEDURE TRAY) ×3 IMPLANT
PAD ARMBOARD 7.5X6 YLW CONV (MISCELLANEOUS) ×15 IMPLANT
PATTIES SURGICAL .5 X1 (DISPOSABLE) IMPLANT
PATTIES SURGICAL 1/4 X 3 (GAUZE/BANDAGES/DRESSINGS) IMPLANT
PATTIES SURGICAL 1X1 (DISPOSABLE) ×3 IMPLANT
RUBBERBAND STERILE (MISCELLANEOUS) ×6 IMPLANT
SEALANT ADHERUS EXTEND TIP (MISCELLANEOUS) ×3 IMPLANT
SPONGE LAP 4X18 X RAY DECT (DISPOSABLE) IMPLANT
SPONGE SURGIFOAM ABS GEL 100 (HEMOSTASIS) ×3 IMPLANT
STAPLER SKIN PROX WIDE 3.9 (STAPLE) IMPLANT
SUT ETHILON 3 0 FSL (SUTURE) ×3 IMPLANT
SUT NURALON 4 0 TR CR/8 (SUTURE) ×6 IMPLANT
SUT VIC AB 0 CT1 18XCR BRD8 (SUTURE) ×2 IMPLANT
SUT VIC AB 0 CT1 8-18 (SUTURE) ×4
SUT VIC AB 2-0 CT2 18 VCP726D (SUTURE) ×3 IMPLANT
SUT VIC AB 3-0 SH 8-18 (SUTURE) ×3 IMPLANT
SYR CONTROL 10ML LL (SYRINGE) ×3 IMPLANT
TAPE CLOTH SURG 4X10 WHT LF (GAUZE/BANDAGES/DRESSINGS) ×3 IMPLANT
TOWEL GREEN STERILE (TOWEL DISPOSABLE) ×3 IMPLANT
TOWEL GREEN STERILE FF (TOWEL DISPOSABLE) IMPLANT
TRAY FOLEY W/METER SILVER 16FR (SET/KITS/TRAYS/PACK) IMPLANT
UNDERPAD 30X30 (UNDERPADS AND DIAPERS) IMPLANT
WATER STERILE IRR 1000ML POUR (IV SOLUTION) ×3 IMPLANT

## 2016-12-09 NOTE — Transfer of Care (Signed)
**Note De-Identified [place] Obfuscation** Immediate Anesthesia Transfer of Care Note  Patient: Derek Donaldson  Procedure(s) Performed: Procedure(s) with comments: CHIARI DECOMPRESSION (N/A) - Posterior   Patient Location: PACU  Anesthesia Type:General  Level of Consciousness: drowsy  Airway & Oxygen Therapy: Patient Spontanous Breathing and Patient connected to face mask oxygen  Post-op Assessment: Report given to RN and Post -op Vital signs reviewed and stable  Post vital signs: Reviewed and stable  Last Vitals:  Vitals:   12/09/16 0615 12/09/16 1115  BP: (!) 161/99   Pulse: 72   Resp: 18   Temp: 36.6 C (!) 36.3 C    Last Pain: There were no vitals filed for this visit.    Patients Stated Pain Goal: 4 (03/17/27 1188)  Complications: No apparent anesthesia complications

## 2016-12-09 NOTE — Anesthesia Preprocedure Evaluation (Signed)
**Note De-Identified [place] Obfuscation** Anesthesia Evaluation  Patient identified by MRN, date of birth, ID band Patient awake    Reviewed: Allergy & Precautions, H&P , NPO status , Patient's Chart, lab work & pertinent test results  Airway Mallampati: II   Neck ROM: full    Dental   Pulmonary shortness of breath, asthma ,    breath sounds clear to auscultation       Cardiovascular hypertension,  Rhythm:regular Rate:Normal     Neuro/Psych  Headaches,  Neuromuscular disease    GI/Hepatic GERD  ,  Endo/Other    Renal/GU      Musculoskeletal   Abdominal   Peds  Hematology   Anesthesia Other Findings   Reproductive/Obstetrics                             Anesthesia Physical Anesthesia Plan  ASA: II  Anesthesia Plan: General   Post-op Pain Management:    Induction: Intravenous  PONV Risk Score and Plan: 3 and Ondansetron, Dexamethasone, Propofol, Midazolam and Treatment may vary due to age or medical condition  Airway Management Planned: Oral ETT  Additional Equipment: Arterial line  Intra-op Plan:   Post-operative Plan: Extubation in OR  Informed Consent: I have reviewed the patients History and Physical, chart, labs and discussed the procedure including the risks, benefits and alternatives for the proposed anesthesia with the patient or authorized representative who has indicated his/her understanding and acceptance.     Plan Discussed with: CRNA, Anesthesiologist and Surgeon  Anesthesia Plan Comments:         Anesthesia Quick Evaluation

## 2016-12-09 NOTE — H&P (Signed)
**Note De-Identified Ambrosino Obfuscation** BP (!) 161/99   Pulse 72   Temp 97.8 F (36.6 C)   Resp 18   SpO2 97%  Derek Donaldson presents for completion of an aborted chiari decompression three years ago due to a AV fistula. He has had his transverse sinus occluded on the left, and he is currently in the best possible position for the procedure. 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  Allergies  Allergen Reactions  . Contrast Media [Iodinated Diagnostic Agents] Anaphylaxis  . Shellfish Allergy Other (See Comments)    Child hood allergy   Family History  Problem Relation Age of Onset  . Aneurysm Father   . Asthma Mother   . Asthma Brother   . Heart failure Maternal Grandmother   . Cancer Maternal Grandfather        unknown type  . Stroke Paternal Grandfather    Social History   Social History  . Marital status: Married    Spouse name: N/A  . Number of children: N/A  . Years of education: N/A   Occupational History  . Not on file.   Social History Main Topics  . Smoking status: Never Smoker  . Smokeless tobacco: Never Used  . Alcohol use No  . Drug use: No  . Sexual activity: Yes   Other Topics Concern  . Not on file   Social History Narrative  . No narrative on file   Past Medical History:  Diagnosis Date  . Asthma    Albuterol inhaler prn;just a small touch  . Cancer ([place]) 2010   skin ( unsure of the pathology)- removed  . Chiari malformation   . Essential hypertension, benign    takes Metoprolol,Diovan,and Amlodipine daily  . Headache    migraines in the past  . History of bronchitis   . History of kidney stones    passed on his own  . Mixed hyperlipidemia    takes Fenofibrate daily  . Muscle spasm    takes Flexeril daily as needed  . Numbness    left arm from chiari malformation  . Pneumonia 2005   hx of  . Shortness of breath    with exertion   Past Surgical History:  Procedure Laterality Date  . CEREBRAL ANGIOGRAM  12/2013  .  CHOLECYSTECTOMY  11/13  . ERCP  03/21/2012   Procedure: ENDOSCOPIC RETROGRADE CHOLANGIOPANCREATOGRAPHY (ERCP);  Surgeon: Jeryl Columbia, MD;  Location: [facility];  Service: Endoscopy;  Laterality: N/A;  aqntibiotics and type and screen  . IR GENERIC HISTORICAL  07/20/2016   IR ANGIO INTRA EXTRACRAN SEL COM CAROTID INNOMINATE BILAT MOD SED 07/20/2016 Luanne Bras, MD [facility]  . IR GENERIC HISTORICAL  07/20/2016   IR ANGIO VERTEBRAL SEL VERTEBRAL UNI R MOD SED 07/20/2016 Luanne Bras, MD [facility]  . IR GENERIC HISTORICAL  07/20/2016   IR ANGIO VERTEBRAL SEL SUBCLAVIAN INNOMINATE UNI L MOD SED 07/20/2016 Luanne Bras, MD [facility]  . MRI     x 2  . RADIOLOGY WITH ANESTHESIA N/A 02/20/2014   Procedure: RADIOLOGY WITH ANESTHESIA EMBOLIZATION;  Surgeon: Rob Hickman, MD;  Location: [place];  Service: Radiology;  Laterality: N/A;  . RADIOLOGY WITH ANESTHESIA N/A 04/29/2014   Procedure: STAGE TWO EMBOLIZATION;  Surgeon: Luanne Bras, MD;  Location: [place];  Service: Radiology;  Laterality: N/A;  . RADIOLOGY WITH ANESTHESIA N/A 06/05/2014   Procedure: Embolization;  Surgeon: Rob Hickman, MD;  Location: [place]; **Note De-Identified Radman Obfuscation** Service: Radiology;  Laterality: N/A;  . RADIOLOGY WITH ANESTHESIA N/A 07/15/2014   Procedure: RADIOLOGY WITH ANESTHESIA/EMBOLIZATION;  Surgeon: Rob Hickman, MD;  Location: [place];  Service: Radiology;  Laterality: N/A;  . STAPEDECTOMY  04/23/2003   Revision left stapedectomy  . SUBOCCIPITAL CRANIECTOMY CERVICAL LAMINECTOMY N/A 12/21/2013   Procedure: Aborted Cranioplasty For Chiari Malformation (Could not complete procedure);  Surgeon: Ashok Pall, MD;  Location: [place] [facility];  Service: Neurosurgery;  Laterality: N/A;  Aborted Cranioplasty For Chiari Malformation  . VASECTOMY     Results for orders placed or performed during the hospital encounter of 12/09/16 (from the past 72 hour(s))  Type and screen All Cardiac and thoracic surgeries, spinal fusions,  myomectomies, craniotomies, colon & liver resections, total joint revisions, same day c-section with placenta previa or accreta.     Status: None   Collection Time: 12/09/16  6:05 AM  Result Value Ref Range   ABO/RH(D) O POS    Antibody Screen NEG    Sample Expiration 25/85/2778   Basic metabolic panel     Status: Abnormal   Collection Time: 12/09/16  6:08 AM  Result Value Ref Range   Sodium 140 135 - 145 mmol/L   Potassium 3.7 3.5 - 5.1 mmol/L   Chloride 106 101 - 111 mmol/L   CO2 25 22 - 32 mmol/L   Glucose, Bld 102 (H) 65 - 99 mg/dL   BUN 13 6 - 20 mg/dL   Creatinine, Ser 0.75 0.61 - 1.24 mg/dL   Calcium 9.3 8.9 - 10.3 mg/dL   GFR calc non Af Amer >60 >60 mL/min   GFR calc Af Amer >60 >60 mL/min    Comment: (NOTE) The eGFR has been calculated using the CKD EPI equation. This calculation has not been validated in all clinical situations. eGFR's persistently <60 mL/min signify possible Chronic Kidney Disease.    Anion gap 9 5 - 15  CBC     Status: None   Collection Time: 12/09/16  6:08 AM  Result Value Ref Range   WBC 7.8 4.0 - 10.5 K/uL   RBC 4.56 4.22 - 5.81 MIL/uL   Hemoglobin 13.7 13.0 - 17.0 g/dL   HCT 40.4 39.0 - 52.0 %   MCV 88.6 78.0 - 100.0 fL   MCH 30.0 26.0 - 34.0 pg   MCHC 33.9 30.0 - 36.0 g/dL   RDW 13.1 11.5 - 15.5 %   Platelets 251 150 - 400 K/uL   Past Surgical History:  Procedure Laterality Date  . CEREBRAL ANGIOGRAM  12/2013  . CHOLECYSTECTOMY  11/13  . ERCP  03/21/2012   Procedure: ENDOSCOPIC RETROGRADE CHOLANGIOPANCREATOGRAPHY (ERCP);  Surgeon: Jeryl Columbia, MD;  Location: [facility];  Service: Endoscopy;  Laterality: N/A;  aqntibiotics and type and screen  . IR GENERIC HISTORICAL  07/20/2016   IR ANGIO INTRA EXTRACRAN SEL COM CAROTID INNOMINATE BILAT MOD SED 07/20/2016 Lu[facility] Bras, MD [facility]  . IR GENERIC HISTORICAL  07/20/2016   IR ANGIO VERTEBRAL SEL VERTEBRAL UNI R MOD SED 07/20/2016 Luanne Bras, MD [facility]  . IR GENERIC  HISTORICAL  07/20/2016   IR ANGIO VERTEBRAL SEL SUBCLAVIAN INNOMINATE UNI L MOD SED 07/20/2016 Luanne Bras, MD [facility]  . MRI     x 2  . RADIOLOGY WITH ANESTHESIA N/A 02/20/2014   Procedure: RADIOLOGY WITH ANESTHESIA EMBOLIZATION;  Surgeon: Rob Hickman, MD;  Location: [place];  Service: Radiology;  Laterality: N/A;  . RADIOLOGY WITH ANESTHESIA N/A 04/29/2014   Procedure: **Note De-Identified [place] Obfuscation** STAGE TWO EMBOLIZATION;  Surgeon: Luanne Bras, MD;  Location: [place];  Service: Radiology;  Laterality: N/A;  . RADIOLOGY WITH ANESTHESIA N/A 06/05/2014   Procedure: Embolization;  Surgeon: Rob Hickman, MD;  Location: [place];  Service: Radiology;  Laterality: N/A;  . RADIOLOGY WITH ANESTHESIA N/A 07/15/2014   Procedure: RADIOLOGY WITH ANESTHESIA/EMBOLIZATION;  Surgeon: Rob Hickman, MD;  Location: [place];  Service: Radiology;  Laterality: N/A;  . STAPEDECTOMY  04/23/2003   Revision left stapedectomy  . SUBOCCIPITAL CRANIECTOMY CERVICAL LAMINECTOMY N/A 12/21/2013   Procedure: Aborted Cranioplasty For Chiari Malformation (Could not complete procedure);  Surgeon: Ashok Pall, MD;  Location: [place] [facility];  Service: Neurosurgery;  Laterality: N/A;  Aborted Cranioplasty For Chiari Malformation  . VASECTOMY     Physical Exam  Constitutional: He is oriented to person, place, and time. He appears well-developed and well-nourished.  HENT:  Head: Normocephalic and atraumatic.  Eyes: Pupils are equal, round, and reactive to light. Conjunctivae and EOM are normal.  Neck: Normal range of motion. Neck supple.  Cardiovascular: Normal rate, regular rhythm, normal heart sounds and intact distal pulses.   Pulmonary/Chest: Effort normal and breath sounds normal.  Abdominal: Soft. Bowel sounds are normal.  Musculoskeletal: Normal range of motion.  Neurological: He is alert and oriented to person, place, and time. He displays normal reflexes. No cranial nerve deficit or sensory deficit. He exhibits normal muscle  tone. Coordination normal.  Skin: Skin is warm and dry.  Psychiatric: He has a normal mood and affect. His behavior is normal. Judgment and thought content normal.  OR for chiari decompression. Risks and benefits he understands well having over the last three years discussed the need for the operation and possible blood loss. Brain damage, spinal cord damage, bleeding, transfusion, need for further operations, no improvement, paralysis, bowel and or bladder dysfunction, and other risks. He understands and wishes to proceed.

## 2016-12-09 NOTE — Anesthesia Procedure Notes (Signed)
**Note De-Identified Luiz Obfuscation** Arterial Line Insertion Performed by: Purnell Shoemaker, CRNA  Patient location: Pre-op. Preanesthetic checklist: patient identified, IV checked, risks and benefits discussed, surgical consent, monitors and equipment checked, pre-op evaluation and timeout performed Right, radial was placed  Attempts: 1 Procedure performed without using ultrasound guided technique. Following insertion, dressing applied and Biopatch. Post procedure assessment: normal  Additional procedure comments: Performed by Noe Gens.

## 2016-12-09 NOTE — Anesthesia Procedure Notes (Addendum)
**Note De-Identified Berrocal Obfuscation** Arterial Line Insertion Start/End7/26/2018 7:20 AM Performed by: Magda Paganini W  Patient location: Pre-op. Preanesthetic checklist: patient identified, IV checked, site marked, risks and benefits discussed, surgical consent, monitors and equipment checked, pre-op evaluation and timeout performed Lidocaine 1% used for infiltration radial was placed Catheter size: 20 G Hand hygiene performed  and maximum sterile barriers used  Allen's test indicative of satisfactory collateral circulation Attempts: 1 Procedure performed without using ultrasound guided technique. Following insertion, dressing applied and Biopatch. Post procedure assessment: normal  Patient tolerated the procedure well with no immediate complications. Additional procedure comments: Performed by Prudence Davidson, SRNA.

## 2016-12-09 NOTE — Anesthesia Procedure Notes (Signed)
**Note De-Identified Noll Obfuscation** Procedure Name: Intubation Date/Time: 12/09/2016 8:20 AM Performed by: Merdis Delay Pre-anesthesia Checklist: Patient identified, Emergency Drugs available, Suction available, Patient being monitored and Timeout performed Patient Re-evaluated:Patient Re-evaluated prior to induction Oxygen Delivery Method: Circle system utilized Preoxygenation: Pre-oxygenation with 100% oxygen Induction Type: IV induction Ventilation: Mask ventilation without difficulty and Oral airway inserted - appropriate to patient size Laryngoscope Size: Mac and 4 Grade View: Grade I Tube type: Oral Tube size: 7.5 mm Number of attempts: 1 Airway Equipment and Method: Stylet Placement Confirmation: ETT inserted through vocal cords under direct vision and positive ETCO2 Secured at: 22 cm Tube secured with: Tape Dental Injury: Teeth and Oropharynx as per pre-operative assessment  Comments: Prudence Davidson srna

## 2016-12-09 NOTE — Progress Notes (Signed)
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 Admitted for surgical decompression. Status post intubation and extubation. Patient has arterial line in place. History of asthma as well as mixed hyperlipidemia. Camera shows patient resting comfortably eyes closed. No distress.   eICU Interventions  Continuing current plan of care as per admitting service.      Intervention Category Evaluation Type: New Patient Evaluation  Tera Partridge 12/09/2016, 3:55 PM

## 2016-12-10 ENCOUNTER — Encounter (HOSPITAL_COMMUNITY): Payer: Self-pay | Admitting: Neurosurgery

## 2016-12-10 LAB — MRSA PCR SCREENING: MRSA BY PCR: NEGATIVE

## 2016-12-10 MED ORDER — PANTOPRAZOLE SODIUM 40 MG PO TBEC
40.0000 mg | DELAYED_RELEASE_TABLET | Freq: Every day | ORAL | Status: DC
  Administered 2016-12-10: 40 mg via ORAL
  Filled 2016-12-10: qty 1

## 2016-12-10 NOTE — Progress Notes (Signed)
**Note De-Identified Kulak Obfuscation** Pt arrived to 5C08 Nobile bed.  Pt alert and oriented.  Pain 1/10.  Pt oriented to the room, wife at bedside.  Will continue to monitor.  Cori Razor, RN

## 2016-12-10 NOTE — Anesthesia Postprocedure Evaluation (Signed)
**Note De-Identified Vondrasek Obfuscation** Anesthesia Post Note  Patient: Taegan D Muela  Procedure(s) Performed: Procedure(s) (LRB): CHIARI DECOMPRESSION (N/A)     Patient location during evaluation: PACU Anesthesia Type: General Level of consciousness: awake and alert and patient cooperative Pain management: pain level controlled Vital Signs Assessment: post-procedure vital signs reviewed and stable Respiratory status: spontaneous breathing and respiratory function stable Cardiovascular status: stable Anesthetic complications: no    Last Vitals:  Vitals:   12/10/16 0400 12/10/16 0500  BP: (!) 142/85 137/82  Pulse: 79 75  Resp: 18 14  Temp:      Last Pain:  Vitals:   12/10/16 0546  TempSrc:   PainSc: 0-No pain                 Yojan Paskett S

## 2016-12-10 NOTE — Progress Notes (Signed)
**Note De-Identified Langbehn Obfuscation** Patient ID: Derek Donaldson, male   DOB: June 24, 1970, 46 y.o.   MRN: [id number] BP (!) 161/100 (BP Location: Right Arm)   Pulse 88   Temp 98.1 F (36.7 C) (Oral)   Resp 20   Ht 5\' 10"  (1.778 m)   Wt (!) 137.5 kg (303 lb 2.1 oz)   SpO2 97%   BMI 43.49 kg/m  Moving all extremities Doing well wound is clean, dry, no signs of infection.

## 2016-12-10 NOTE — Progress Notes (Signed)
**Note De-Identified Karrer Obfuscation** Patient voided post foley-removal(823ml documented). Patient denies pain/discomfort. Will continue to monitor.

## 2016-12-10 NOTE — Progress Notes (Signed)
**Note De-Identified Ohlin Obfuscation** Stopped by to visit w/ pt and wife -- had missed them earlier in ICU when pt was well enough to transfer out. Pt reported feeling stronger, and he and wife Shirlean Mylar hope he can go home tomorrow. He said this has been 3 yrs coming, and she said they'd been here enough. Provided spiritual/emotional support and prayer -- which they appreciated. (She said he was [facility], and she was [place], and we agreed it was the same Christ.) Exchanged good wishes. Chaplain available for f/u.    12/10/16 1600  Clinical Encounter Type  Visited With Patient and family together  Visit Type Initial;Psychological support;Spiritual support;Social support;Post-op  Referral From Chaplain  Spiritual Encounters  Spiritual Needs Prayer;Emotional  Stress Factors  Patient Stress Factors Health changes;Loss of control  Family Stress Factors Family relationships;Health changes;Loss of control   Gerrit Heck, Chaplain

## 2016-12-11 MED ORDER — TIZANIDINE HCL 4 MG PO TABS: 4.0000 mg | ORAL_TABLET | Freq: Four times a day (QID) | ORAL | 0 refills | Status: AC | PRN

## 2016-12-11 MED ORDER — DEXAMETHASONE 4 MG PO TABS: 4.0000 mg | ORAL_TABLET | Freq: Two times a day (BID) | ORAL | 0 refills | Status: DC

## 2016-12-11 MED ORDER — HYDROCODONE-ACETAMINOPHEN 5-325 MG PO TABS: 1.0000 | ORAL_TABLET | Freq: Four times a day (QID) | ORAL | 0 refills | Status: AC | PRN

## 2016-12-11 NOTE — Progress Notes (Signed)
**Note De-Identified Adamik Obfuscation** Patient given discharge instructions.  All questions and concerns addressed.  Patient left unit by wheelchair with all belongings accompanied by staff.

## 2016-12-11 NOTE — Discharge Instructions (Signed)
**Note De-identified Guerreiro Obfuscation** Craniotomy °Care After °Please read the instructions outlined below and refer to this sheet in the next few weeks. These discharge instructions provide you with general information on caring for yourself after you leave the hospital. Your surgeon may also give you specific instructions. While your treatment has been planned according to the most current medical practices available, unavoidable complications occasionally occur. If you have any problems or questions after discharge, please call your surgeon. °Although there are many types of brain surgery, recovery following craniotomy (surgical opening of the skull) is much the same for each. However, recovery depends on many factors. These include the type and severity of brain injury and the type of surgery. It also depends on any nervous system function problems (neurological deficits) before surgery. If the craniotomy was done for cancer, chemotherapy and radiation could follow. You could be in the hospital from 5 days to a couple weeks. This depends on the type of surgery, findings, and whether there are complications. °HOME CARE INSTRUCTIONS  °· It is not unusual to hear a clicking noise after a craniotomy, the plates and screws used to attach the bone flap can sometimes cause this. It is a normal occurrence if this does happen °· Do not drive for 10 days after the operation °· Your scalp may feel spongy for a while, because of fluid under it. This will gradually get better. Occasionally, the surgeon will not replace the bone that was removed to access the brain. If there is a bony defect, the surgeon will ask you to wear a helmet for protection. This is a discussion you should have with your surgeon prior to leaving the hospital (discharge). °· Numbness may persist in some areas of your scalp. °· Take all medications as directed. Sometimes steroids to control swelling are prescribed. Anticonvulsants to prevent seizures may also be given. Do not use alcohol,  other drugs, or medications unless your surgeon says it is OK. °· Keep the wound dry and clean. The wound may be washed gently with soap and water. Then, you may gently blot or dab it dry, without rubbing. Do not take baths, use swimming pools or hot tubs for 10 days, or as instructed by your caregiver. It is best to wait to see you surgeon at your first postoperative visit, and to get directions at that time. °· Only take over-the-counter or prescription medicines for pain, discomfort, or fever as directed by your caregiver. °· You may continue your normal diet, as directed. °· Walking is OK for exercise. Wait at least 3 months before you return to mild, non-contact sports or as your surgeon suggests. Contact sports should be avoided for at least 1 year, unless your surgeon says it is OK. °· If you are prescribed steroids, take them exactly as prescribed. If you start having a decrease in nervous system functions (neurological deficits) and headaches as the dose of steroids is reduced, tell your surgeon right away. °· When the anticonvulsant prescription is finished you no longer need to take it. °SEEK IMMEDIATE MEDICAL CARE IF:  °· You develop nausea, vomiting, severe headaches, confusion, or you have a seizure. °· You develop chest pain, a stiff neck, or difficulty breathing. °· There is redness, swelling, or increasing pain in the wound or pin insertion sites. °· You have an increase in swelling or bruising around the eyes. °· There is drainage or pus coming from the wound. °· You have an oral temperature above 102° F (38.9° C), not controlled by medicine. °·  **Note De-identified Duplessis Obfuscation** You notice a foul smell coming from the wound or dressing. °· The wound breaks open (edges not staying together) after the stitches have been removed. °· You develop dizziness or fainting while standing. °· You develop a rash. °· You develop any reaction or side effects to the medications given. °Document Released: 08/03/2005 Document Revised: 07/26/2011  Document Reviewed: 05/12/2009 °ExitCare® Patient Information ©2013 [employer]. ° °

## 2016-12-11 NOTE — Op Note (Signed)
**Note De-Identified Taha Obfuscation** 12/09/2016  11:28 AM  PATIENT:  Derek Donaldson  46 y.o. male  PRE-OPERATIVE DIAGNOSIS:  ARNOLD-CHIARI MALFORMATION , TYPE I  POST-OPERATIVE DIAGNOSIS:  ARNOLD-CHIARI MALFORMATION , TYPE I  PROCEDURE:  Procedure(s): CHIARI DECOMPRESSION  SURGEON: Surgeon(s): Ashok Pall, MD Consuella Lose, MD  ASSISTANTS:Nundkumar  ANESTHESIA:   general  EBL:  Total I/O In: 120 [P.O.:120] Out: -   BLOOD ADMINISTERED:none  CELL SAVER GIVEN:none  COUNT:per nursing  DRAINS: none   SPECIMEN:  No Specimen  DICTATION: [name] was taken to the operating room, intubated, and placed under a general anesthetic without difficulty.Once adequate anesthesia was obtained I placed a three pin Mayfield head holder on his head. A foley catheter was placed under sterile conditions. He was positioned prone on body rolls with his head attached to the Mayfield adapter. All pressure points were properly padded. His head was shaved and prepped in a sterile manner.  I infiltrated the old incision with lidocaine. I draped the head in a sterile fashion. I opened the scalp with a 10 blade and exposed the occiput, and the C2 lamina. The C1 laminectomy had been completed. I used the drill to thin the occiput. There was a great deal of bleeding which we controlled with bone wax. With care we were able to complete the suboccipital craniectomy, with good hemostasis. We identified the C1 lamina edges, and the foramen Magnum. We performed the duraplasty by opening the dura in a y fashion. We were able to identify the cerebellar tonsils and the dural opening was caudal to them. We then sewed in the dural patch graft with neurolon sutures. I applied a dural sealant, again waxed bone edges, then irrigated. I then started wound closure. I approximated the nuchal ligament, subcutaneous, and subcuticular layers. I approximated the scalp with a running nylon suture. I applied a sterile dressing. I removed the pins, and the head  holder. He was turned prone, and placed on a bed. He was extubated in the operating room.   PLAN OF CARE: Admit to inpatient   PATIENT DISPOSITION:  PACU - hemodynamically stable.   Delay start of Pharmacological VTE agent (>24hrs) due to surgical blood loss or risk of bleeding:  yes

## 2016-12-11 NOTE — Discharge Summary (Signed)
**Note De-Identified Ferryman Obfuscation** Physician Discharge Summary  Patient ID: Derek Donaldson MRN: [id number] DOB/AGE: 46-Nov-1972 46 y.o.  Admit date: 12/09/2016 Discharge date: 12/11/2016  Admission Diagnoses:Chiari Malformation 1  Discharge Diagnoses:  Active Problems:   Chiari malformation type I [facility])   Discharged Condition: good  Hospital Course: Mr. Belt underwent a suboccipital craniectomy and duraplasty to complete his chiari decompression three years from his aborted procedure. 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 Since then he has had the fistula treated. Post op he has done very well, ambulating, voiding, and tolerating a regular diet.   Treatments: surgery: Suboccipital craniectomy and duraplasty  Discharge Exam: Blood pressure (!) 162/97, pulse 83, temperature (!) 97.5 F (36.4 C), temperature source Oral, resp. rate 20, height 5\' 10"  (1.778 m), weight (!) 137.5 kg (303 lb 2.1 oz), SpO2 97 %. General appearance: alert, cooperative and appears stated age Normal neurologic exam. Normal strength, normal cranial nerve function. Memory, language, fund of knowledge is normal. Speech is clear and fluent. Disposition: 01-Home or Self Care ARNOLD-CHIARI MALFORMATION , TYPE I  Allergies as of 12/11/2016      Reactions   Contrast Media [iodinated Diagnostic Agents] Anaphylaxis   Shellfish Allergy Other (See Comments)   Child hood allergy      Medication List    TAKE these medications   albuterol 108 (90 Base) MCG/ACT inhaler Commonly known as:  PROVENTIL HFA;VENTOLIN HFA Inhale 2 puffs into the lungs every 4 (four) hours as needed for wheezing or shortness of breath.   amLODipine 10 MG tablet Commonly known as:  NORVASC Take 10 mg by mouth daily before breakfast.   ANTARA 130 MG capsule Generic drug:  fenofibrate micronized Take 130 mg by mouth daily before breakfast.   aspirin EC 81 MG tablet Take 81 mg by mouth daily.   EPINEPHrine  0.3 mg/0.3 mL Soaj injection Commonly known as:  EPI-PEN Inject 0.3 mg into the muscle as needed (emergent allergic reaction).   HYDROcodone-acetaminophen 5-325 MG tablet Commonly known as:  NORCO/VICODIN Take 1 tablet by mouth every 6 (six) hours as needed for moderate pain.   ibuprofen 200 MG tablet Commonly known as:  ADVIL,MOTRIN Take 400 mg by mouth every 6 (six) hours as needed for mild pain or moderate pain.   metoprolol succinate 25 MG 24 hr tablet Commonly known as:  TOPROL-XL Take 25 mg by mouth daily.   potassium chloride SA 20 MEQ tablet Commonly known as:  K-DUR,KLOR-CON Take 1 tablet (20 mEq total) by mouth daily.   tiZANidine 4 MG tablet Commonly known as:  ZANAFLEX Take 1 tablet (4 mg total) by mouth every 6 (six) hours as needed for muscle spasms.   valsartan-hydrochlorothiazide 320-25 MG tablet Commonly known as:  DIOVAN-HCT Take 1 tablet by mouth daily before breakfast.        Signed: Juell Radney L 12/11/2016, 11:23 AM

## 2016-12-11 NOTE — Progress Notes (Signed)
**Note De-Identified Dockham Obfuscation** Patient ID: Derek Donaldson, male   DOB: [birth year], 46 y.o.   MRN: [id number] Subjective: Patient reports mild soreness, no arm pain or NTW  Objective: Vital signs in last 24 hours: Temp:  [97.9 F (36.6 C)-98.3 F (36.8 C)] 98.3 F (36.8 C) (07/28 0512) Pulse Rate:  [75-100] 75 (07/28 0512) Resp:  [18-23] 20 (07/28 0512) BP: (136-168)/(89-100) 168/96 (07/28 0512) SpO2:  [95 %-98 %] 97 % (07/28 0512)  Intake/Output from previous day: 07/27 0701 - 07/28 0700 In: 840 [P.O.:600; I.V.:240] Out: 1975 [[id number]] Intake/Output this shift: No intake/output data recorded.  Neurologic: Grossly normal, dressing dry  Lab Results: Lab Results  Component Value Date   WBC 7.8 12/09/2016   HGB 10.2 (L) 12/09/2016   HCT 30.0 (L) 12/09/2016   MCV 88.6 12/09/2016   PLT 251 12/09/2016   Lab Results  Component Value Date   INR 1.04 07/20/2016   BMET Lab Results  Component Value Date   NA 145 12/09/2016   K 3.9 12/09/2016   CL 106 12/09/2016   CO2 25 12/09/2016   GLUCOSE 102 (H) 12/09/2016   BUN 13 12/09/2016   CREATININE 0.75 12/09/2016   CALCIUM 9.3 12/09/2016    Studies/Results: No results found.  Assessment/Plan: Doing well, mobilize   LOS: 2 days    Dakin Madani S 12/11/2016, 8:45 AM

## 2016-12-13 LAB — TYPE AND SCREEN
ABO/RH(D): O POS
Antibody Screen: NEGATIVE
Unit division: 0
Unit division: 0

## 2016-12-13 LAB — BPAM RBC
Blood Product Expiration Date: 201808282359
Blood Product Expiration Date: 201808282359
ISSUE DATE / TIME: 201807260909
ISSUE DATE / TIME: 201807260909
UNIT TYPE AND RH: 5100
Unit Type and Rh: 5100

## 2016-12-17 ENCOUNTER — Emergency Department (HOSPITAL_COMMUNITY): Payer: BLUE CROSS/BLUE SHIELD

## 2016-12-17 ENCOUNTER — Encounter (HOSPITAL_COMMUNITY): Payer: Self-pay | Admitting: *Deleted

## 2016-12-17 ENCOUNTER — Inpatient Hospital Stay (HOSPITAL_COMMUNITY)
Admission: EM | Admit: 2016-12-17 | Discharge: 2016-12-23 | DRG: 091 | Disposition: A | Discharge status: Home / Self Care | Payer: BLUE CROSS/BLUE SHIELD | Attending: Internal Medicine | Admitting: Internal Medicine

## 2016-12-17 DIAGNOSIS — E6609 Other obesity due to excess calories: Secondary | ICD-10-CM | POA: Diagnosis present

## 2016-12-17 DIAGNOSIS — N390 Urinary tract infection, site not specified: Secondary | ICD-10-CM | POA: Diagnosis present

## 2016-12-17 DIAGNOSIS — M62838 Other muscle spasm: Secondary | ICD-10-CM

## 2016-12-17 DIAGNOSIS — J4520 Mild intermittent asthma, uncomplicated: Secondary | ICD-10-CM | POA: Diagnosis present

## 2016-12-17 DIAGNOSIS — Z6841 Body Mass Index (BMI) 40.0 and over, adult: Secondary | ICD-10-CM

## 2016-12-17 DIAGNOSIS — R0602 Shortness of breath: Secondary | ICD-10-CM

## 2016-12-17 DIAGNOSIS — R519 Headache, unspecified: Secondary | ICD-10-CM

## 2016-12-17 DIAGNOSIS — E871 Hypo-osmolality and hyponatremia: Secondary | ICD-10-CM | POA: Diagnosis present

## 2016-12-17 DIAGNOSIS — Z825 Family history of asthma and other chronic lower respiratory diseases: Secondary | ICD-10-CM

## 2016-12-17 DIAGNOSIS — G9782 Other postprocedural complications and disorders of nervous system: Principal | ICD-10-CM | POA: Diagnosis present

## 2016-12-17 DIAGNOSIS — G935 Compression of brain: Secondary | ICD-10-CM | POA: Diagnosis present

## 2016-12-17 DIAGNOSIS — E782 Mixed hyperlipidemia: Secondary | ICD-10-CM | POA: Diagnosis present

## 2016-12-17 DIAGNOSIS — Y838 Other surgical procedures as the cause of abnormal reaction of the patient, or of later complication, without mention of misadventure at the time of the procedure: Secondary | ICD-10-CM | POA: Diagnosis present

## 2016-12-17 DIAGNOSIS — G038 Meningitis due to other specified causes: Secondary | ICD-10-CM | POA: Diagnosis present

## 2016-12-17 DIAGNOSIS — I10 Essential (primary) hypertension: Secondary | ICD-10-CM | POA: Diagnosis present

## 2016-12-17 DIAGNOSIS — Z87442 Personal history of urinary calculi: Secondary | ICD-10-CM

## 2016-12-17 DIAGNOSIS — Z91013 Allergy to seafood: Secondary | ICD-10-CM

## 2016-12-17 DIAGNOSIS — D72829 Elevated white blood cell count, unspecified: Secondary | ICD-10-CM | POA: Diagnosis present

## 2016-12-17 DIAGNOSIS — G030 Nonpyogenic meningitis: Secondary | ICD-10-CM | POA: Diagnosis present

## 2016-12-17 DIAGNOSIS — R51 Headache: Secondary | ICD-10-CM

## 2016-12-17 DIAGNOSIS — Z823 Family history of stroke: Secondary | ICD-10-CM

## 2016-12-17 DIAGNOSIS — Z91041 Radiographic dye allergy status: Secondary | ICD-10-CM

## 2016-12-17 LAB — CBC WITH DIFFERENTIAL/PLATELET
BASOS PCT: 0 %
Basophils Absolute: 0 10*3/uL (ref 0.0–0.1)
EOS ABS: 0.2 10*3/uL (ref 0.0–0.7)
EOS PCT: 1 %
HCT: 36.1 % — ABNORMAL LOW (ref 39.0–52.0)
HEMOGLOBIN: 12.1 g/dL — AB (ref 13.0–17.0)
Lymphocytes Relative: 13 %
Lymphs Abs: 2.4 10*3/uL (ref 0.7–4.0)
MCH: 29.7 pg (ref 26.0–34.0)
MCHC: 33.5 g/dL (ref 30.0–36.0)
MCV: 88.7 fL (ref 78.0–100.0)
MONO ABS: 1.3 10*3/uL — AB (ref 0.1–1.0)
MONOS PCT: 7 %
NEUTROS PCT: 79 %
Neutro Abs: 14 10*3/uL — ABNORMAL HIGH (ref 1.7–7.7)
Platelets: 365 10*3/uL (ref 150–400)
RBC: 4.07 MIL/uL — ABNORMAL LOW (ref 4.22–5.81)
RDW: 13.5 % (ref 11.5–15.5)
WBC: 17.8 10*3/uL — ABNORMAL HIGH (ref 4.0–10.5)

## 2016-12-17 LAB — BASIC METABOLIC PANEL
Anion gap: 7 (ref 5–15)
BUN: 15 mg/dL (ref 6–20)
CALCIUM: 9.2 mg/dL (ref 8.9–10.3)
CHLORIDE: 99 mmol/L — AB (ref 101–111)
CO2: 26 mmol/L (ref 22–32)
CREATININE: 1 mg/dL (ref 0.61–1.24)
GFR calc Af Amer: >60 mL/min (ref >60)
GFR calc non Af Amer: >60 mL/min (ref >60)
Glucose, Bld: 109 mg/dL — ABNORMAL HIGH (ref 65–99)
Potassium: 3.4 mmol/L — ABNORMAL LOW (ref 3.5–5.1)
SODIUM: 132 mmol/L — AB (ref 135–145)

## 2016-12-17 MED ORDER — HYDROMORPHONE HCL 1 MG/ML IJ SOLN
1.0000 mg | Freq: Once | INTRAMUSCULAR | Status: AC
  Administered 2016-12-17: 1 mg via INTRAVENOUS
  Filled 2016-12-17: qty 1

## 2016-12-17 MED ORDER — METHOCARBAMOL 1000 MG/10ML IJ SOLN
500.0000 mg | Freq: Once | INTRAVENOUS | Status: AC
  Administered 2016-12-17: 500 mg via INTRAVENOUS
  Filled 2016-12-17: qty 5

## 2016-12-17 MED ORDER — METHOCARBAMOL 1000 MG/10ML IJ SOLN: 500.0000 mg | Freq: Once | INTRAMUSCULAR | Status: DC

## 2016-12-17 MED ORDER — METOCLOPRAMIDE HCL 5 MG/ML IJ SOLN
10.0000 mg | Freq: Once | INTRAMUSCULAR | Status: AC
  Administered 2016-12-17: 10 mg via INTRAVENOUS
  Filled 2016-12-17: qty 2

## 2016-12-17 MED ORDER — DEXAMETHASONE SODIUM PHOSPHATE 10 MG/ML IJ SOLN
10.0000 mg | Freq: Once | INTRAMUSCULAR | Status: AC
  Administered 2016-12-17: 10 mg via INTRAVENOUS
  Filled 2016-12-17: qty 1

## 2016-12-17 NOTE — ED Provider Notes (Addendum)
**Note De-Identified Jurney Obfuscation** [facility] DEPT Provider Note   CSN: [id number] Arrival date & time: 12/17/16  1912     History   Chief Complaint Chief Complaint  Patient presents with  . Headache    HPI Derek Donaldson is a 46 y.o. male.  HPI 46 year old male who presents with headache. He has a history of recent suboccipital craniectomy and duraplasty by Dr. Cyndy Freeze on 12/09/2016 for an incomplete Chiari decompression that was aborted 3 years ago secondary to AV fistula. He was discharge on the hospital 4-5 days ago, initially feeling well. Over the past 24 hours he has had suddenly worsening headache diffusely associated with nausea. Had a low-grade temperature of 99.7 prior to coming to the ED today. No vision or speech changes, focal numbness or weakness, vomiting, photophobia or phonophobia. States that he has intense neck spasms and difficulty moving his neck. Symptoms not improved with hydrocodone and Flexeril. States he has not had similar headaches in the past.   Past Medical History:  Diagnosis Date  . Asthma    Albuterol inhaler prn;just a small touch  . Cancer ([place]) 2010   skin ( unsure of the pathology)- removed  . Chiari malformation   . Essential hypertension, benign    takes Metoprolol,Diovan,and Amlodipine daily  . Headache    migraines in the past  . History of bronchitis   . History of kidney stones    passed on his own  . Mixed hyperlipidemia    takes Fenofibrate daily  . Muscle spasm    takes Flexeril daily as needed  . Numbness    left arm from chiari malformation  . Pneumonia 2005   hx of  . Shortness of breath    with exertion    Patient Active Problem List   Diagnosis Date Noted  . Leukocytosis   . Facial nerve palsy, secondary   . ICH (intracerebral hemorrhage) ([place]) 07/15/2014  . Dural arteriovenous fistula   . AVF (arteriovenous fistula) ([place]) 06/05/2014  . 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 Chronic  cholecystitis with calculus s/p lap chole [id number] 04/03/2012  . Choledocholithiasis s/p ERCP [id number] 03/21/2012  . GERD (gastroesophageal reflux disease)   . Essential hypertension, benign 09/22/2011  . Mixed hyperlipidemia 09/22/2011    Past Surgical History:  Procedure Laterality Date  . CEREBRAL ANGIOGRAM  12/2013  . CHOLECYSTECTOMY  11/13  . ERCP  03/21/2012   Procedure: ENDOSCOPIC RETROGRADE CHOLANGIOPANCREATOGRAPHY (ERCP);  Surgeon: Jeryl Columbia, MD;  Location: [facility];  Service: Endoscopy;  Laterality: N/A;  aqntibiotics and type and screen  . IR GENERIC HISTORICAL  07/20/2016   IR ANGIO INTRA EXTRACRAN SEL COM CAROTID INNOMINATE BILAT MOD SED 07/20/2016 Luanne Bras, MD [facility]  . IR GENERIC HISTORICAL  07/20/2016   IR ANGIO VERTEBRAL SEL VERTEBRAL UNI R MOD SED 07/20/2016 Luanne Bras, MD [facility]  . IR GENERIC HISTORICAL  07/20/2016   IR ANGIO VERTEBRAL SEL SUBCLAVIAN INNOMINATE UNI L MOD SED 07/20/2016 Luanne Bras, MD [facility]  . MRI     x 2  . RADIOLOGY WITH ANESTHESIA N/A 02/20/2014   Procedure: RADIOLOGY WITH ANESTHESIA EMBOLIZATION;  Surgeon: Rob Hickman, MD;  Location: [facility];  Service: Radiology;  Laterality: N/A;  . RADIOLOGY WITH ANESTHESIA N/A 04/29/2014   Procedure: STAGE TWO EMBOLIZATION;  Surgeon: Luanne Bras, MD;  Location: [facility];  Service: Radiology;  Laterality: N/A;  . RADIOLOGY WITH ANESTHESIA N/A 06/05/2014   Procedure: Embolization; **Note De-Identified Jani Obfuscation** Surgeon: Rob Hickman, MD;  Location: [facility];  Service: Radiology;  Laterality: N/A;  . RADIOLOGY WITH ANESTHESIA N/A 07/15/2014   Procedure: RADIOLOGY WITH ANESTHESIA/EMBOLIZATION;  Surgeon: Rob Hickman, MD;  Location: [facility];  Service: Radiology;  Laterality: N/A;  . STAPEDECTOMY  04/23/2003   Revision left stapedectomy  . SUBOCCIPITAL CRANIECTOMY CERVICAL LAMINECTOMY N/A 12/21/2013   Procedure: Aborted Cranioplasty For Chiari Malformation (Could not complete procedure);  Surgeon:  Ashok Pall, MD;  Location: [facility];  Service: Neurosurgery;  Laterality: N/A;  Aborted Cranioplasty For Chiari Malformation  . SUBOCCIPITAL CRANIECTOMY CERVICAL LAMINECTOMY N/A 12/09/2016   Procedure: CHIARI DECOMPRESSION;  Surgeon: Ashok Pall, MD;  Location: [facility];  Service: Neurosurgery;  Laterality: N/A;  Posterior   . VASECTOMY         Home Medications    Prior to Admission medications   Medication Sig Start Date End Date Taking? Authorizing Provider  acetaminophen (TYLENOL) 500 MG tablet Take 1,000 mg by mouth every 6 (six) hours as needed for headache (pain).   Yes [provider]  albuterol (PROVENTIL HFA;VENTOLIN HFA) 108 (90 BASE) MCG/ACT inhaler Inhale 2 puffs into the lungs every 4 (four) hours as needed for wheezing or shortness of breath.   Yes [provider]  amLODipine (NORVASC) 10 MG tablet Take 10 mg by mouth daily before breakfast.    Yes [provider]  aspirin EC 81 MG tablet Take 81 mg by mouth daily.   Yes [provider]  EPINEPHrine 0.3 mg/0.3 mL IJ SOAJ injection Inject 0.3 mg into the muscle once as needed (severe allergic reaction).    Yes [provider]  fenofibrate micronized (ANTARA) 130 MG capsule Take 130 mg by mouth daily before breakfast.   Yes [provider]  HYDROcodone-acetaminophen (NORCO/VICODIN) 5-325 MG tablet Take 1 tablet by mouth every 6 (six) hours as needed for moderate pain. 12/11/16  Yes Ashok Pall, MD  ibuprofen (ADVIL,MOTRIN) 200 MG tablet Take 400 mg by mouth every 6 (six) hours as needed for mild pain or moderate pain.   Yes [provider]  metoprolol succinate (TOPROL-XL) 25 MG 24 hr tablet Take 25 mg by mouth daily before breakfast.    Yes [provider]  potassium chloride SA (K-DUR,KLOR-CON) 20 MEQ tablet Take 1 tablet (20 mEq total) by mouth daily. 10/20/15  Yes Milton Ferguson, MD  tiZANidine (ZANAFLEX) 4 MG tablet Take 1 tablet (4 mg total) by mouth  every 6 (six) hours as needed for muscle spasms. 12/11/16  Yes Ashok Pall, MD  valsartan-hydrochlorothiazide (DIOVAN-HCT) 320-25 MG per tablet Take 1 tablet by mouth daily before breakfast.    Yes [provider]    Family History Family History  Problem Relation Age of Onset  . Aneurysm Father   . Asthma Mother   . Asthma Brother   . Heart failure Maternal Grandmother   . Cancer Maternal Grandfather        unknown type  . Stroke Paternal Grandfather     Social History Social History  Substance Use Topics  . Smoking status: Never Smoker  . Smokeless tobacco: Never Used  . Alcohol use No     Allergies   Contrast media [iodinated diagnostic agents] and Shellfish allergy   Review of Systems Review of Systems  Respiratory: Negative for shortness of breath.   Cardiovascular: Negative for chest pain.  Musculoskeletal: Positive for neck pain.  Neurological: Positive for headaches.  Hematological: Does not bruise/bleed easily.  Psychiatric/Behavioral: Negative **Note De-Identified Fogarty Obfuscation** for confusion.  All other systems reviewed and are negative.    Physical Exam Updated Vital Signs BP (!) 149/91   Pulse 88   Temp (!) 100.8 F (38.2 C) (Rectal)   Resp 16   Ht 5\' 10"  (1.778 m)   Wt 133.8 kg (295 lb)   SpO2 (!) 89%   BMI 42.33 kg/m   Physical Exam Physical Exam  Nursing note and vitals reviewed. Constitutional: Well developed, well nourished, appears very uncomfortable secondary to pain Head: Normocephalic and atraumatic.  Mouth/Throat: Oropharynx is clear and moist.  Neck: posterior neck wound clean dry and in tact Cardiovascular: Normal rate and regular rhythm.   Pulmonary/Chest: Effort normal and breath sounds normal.  Abdominal: Soft. There is no tenderness. There is no rebound and no guarding.  Musculoskeletal: Normal range of motion.  Skin: Skin is warm and dry.  Psychiatric: Cooperative Neurological:  Alert, oriented to person, place, time, and situation. Memory  grossly in tact. Fluent speech. No dysarthria or aphasia.  Cranial nerves: VF are full.EOMI without nystagmus. No gaze deviation. Facial muscles symmetric with activation. Sensation to light touch over face in tact bilaterally. Hearing grossly in tact. Palate elevates symmetrically. Head turn and shoulder shrug are intact. Tongue midline.  Reflexes defered.  Muscle bulk and tone normal. No pronator drift. Moves all extremities symmetrically. Sensation to light touch is in tact throughout in bilateral upper and lower extremities. Coordination reveals no dysmetria with finger to nose.   ED Treatments / Results  Labs (all labs ordered are listed, but only abnormal results are displayed) Labs Reviewed  CBC WITH DIFFERENTIAL/PLATELET - Abnormal; Notable for the following:       Result Value   WBC 17.8 (*)    RBC 4.07 (*)    Hemoglobin 12.1 (*)    HCT 36.1 (*)    Neutro Abs 14.0 (*)    Monocytes Absolute 1.3 (*)    All other components within normal limits  BASIC METABOLIC PANEL - Abnormal; Notable for the following:    Sodium 132 (*)    Potassium 3.4 (*)    Chloride 99 (*)    Glucose, Bld 109 (*)    All other components within normal limits  CULTURE, BLOOD (ROUTINE X 2)  CULTURE, BLOOD (ROUTINE X 2)  URINALYSIS, ROUTINE W REFLEX MICROSCOPIC  I-STAT CG4 LACTIC ACID, ED    EKG  EKG Interpretation None       Radiology No results found.  Procedures Procedures (including critical care time)  Medications Ordered in ED Medications  HYDROmorphone (DILAUDID) injection 1 mg (not administered)  acetaminophen (TYLENOL) tablet 1,000 mg (not administered)  HYDROmorphone (DILAUDID) injection 1 mg (1 mg Intravenous Given 12/17/16 2103)  metoCLOPramide (REGLAN) injection 10 mg (10 mg Intravenous Given 12/17/16 2104)  methocarbamol (ROBAXIN) 500 mg in dextrose 5 % 50 mL IVPB (0 mg Intravenous Stopped 12/18/16 0053)  dexamethasone (DECADRON) injection 10 mg (10 mg Intravenous Given 12/17/16  2355)     Initial Impression / Assessment and Plan / ED Course  I have reviewed the triage vital signs and the nursing notes.  Pertinent labs & imaging results that were available during my care of the patient were reviewed by me and considered in my medical decision making (see chart for details).     Does appear very uncomfortable on presentation secondary to severe pain. Neurological exam is in tact but with limited ROM of neck due to spasms. Initially afebrile and HD stable. Blood work c/f leukocytosis of 17. **Note De-Identified Delap Obfuscation** Ct head visualized and discussed with radiology. There are post-surgical changes, but no acute processes.  Pain has been better controlled with IV pain medications and muscle relaxants. Patient later with rectal temperature of 100.46F which was discussed with Dr. Arnoldo Morale, on call for neurosurgery. He favors chemical meningitis. Did recommend admission for IR guided LP tomorrow. He recommended hold antibiotics until after IR guided LP.  He will see patient in the hospital. Will consult hospitalist service for admission.   Final Clinical Impressions(s) / ED Diagnoses   Final diagnoses:  Bad headache  Muscle spasms of neck  Chemical meningitis    New Prescriptions New Prescriptions   No medications on file     Forde Dandy, MD 12/18/16 0127    Forde Dandy, MD 12/18/16 [phone]

## 2016-12-17 NOTE — ED Triage Notes (Signed)
**Note De-Identified Stick Obfuscation** The pt had surgery for curaire   This last Thursday today mid- morning he developed a severe headache neck pain and muscle spasms/  His incision to the back of her head and neck  Appear clean no redness or swelling he also feels like his throat is swelling  His surgeon whio is dr Cyndy Freeze is out of the cou ntry  He did not call the on call doctor  He had a vicodin at 1730  It did not help his pain.  Alert oriented and he had a low grade temp earlier

## 2016-12-17 NOTE — ED Triage Notes (Signed)
**Note De-Identified Joiner Obfuscation** Correction on spelling  The pt has a chiari malformation surgwey

## 2016-12-18 ENCOUNTER — Inpatient Hospital Stay (HOSPITAL_COMMUNITY): Payer: BLUE CROSS/BLUE SHIELD

## 2016-12-18 ENCOUNTER — Encounter (HOSPITAL_COMMUNITY): Payer: Self-pay | Admitting: Internal Medicine

## 2016-12-18 DIAGNOSIS — G935 Compression of brain: Secondary | ICD-10-CM

## 2016-12-18 DIAGNOSIS — N390 Urinary tract infection, site not specified: Secondary | ICD-10-CM | POA: Diagnosis present

## 2016-12-18 DIAGNOSIS — E871 Hypo-osmolality and hyponatremia: Secondary | ICD-10-CM | POA: Diagnosis present

## 2016-12-18 DIAGNOSIS — D72829 Elevated white blood cell count, unspecified: Secondary | ICD-10-CM

## 2016-12-18 DIAGNOSIS — Z91041 Radiographic dye allergy status: Secondary | ICD-10-CM | POA: Diagnosis not present

## 2016-12-18 DIAGNOSIS — I10 Essential (primary) hypertension: Secondary | ICD-10-CM | POA: Diagnosis present

## 2016-12-18 DIAGNOSIS — R51 Headache: Secondary | ICD-10-CM

## 2016-12-18 DIAGNOSIS — G030 Nonpyogenic meningitis: Secondary | ICD-10-CM | POA: Diagnosis present

## 2016-12-18 DIAGNOSIS — G9782 Other postprocedural complications and disorders of nervous system: Principal | ICD-10-CM

## 2016-12-18 DIAGNOSIS — J4520 Mild intermittent asthma, uncomplicated: Secondary | ICD-10-CM | POA: Diagnosis present

## 2016-12-18 DIAGNOSIS — G038 Meningitis due to other specified causes: Secondary | ICD-10-CM | POA: Diagnosis not present

## 2016-12-18 DIAGNOSIS — Z91013 Allergy to seafood: Secondary | ICD-10-CM | POA: Diagnosis not present

## 2016-12-18 DIAGNOSIS — Z6841 Body Mass Index (BMI) 40.0 and over, adult: Secondary | ICD-10-CM | POA: Diagnosis not present

## 2016-12-18 DIAGNOSIS — Z87442 Personal history of urinary calculi: Secondary | ICD-10-CM | POA: Diagnosis not present

## 2016-12-18 DIAGNOSIS — R0602 Shortness of breath: Secondary | ICD-10-CM | POA: Diagnosis not present

## 2016-12-18 DIAGNOSIS — E782 Mixed hyperlipidemia: Secondary | ICD-10-CM | POA: Diagnosis present

## 2016-12-18 DIAGNOSIS — Y838 Other surgical procedures as the cause of abnormal reaction of the patient, or of later complication, without mention of misadventure at the time of the procedure: Secondary | ICD-10-CM | POA: Diagnosis present

## 2016-12-18 DIAGNOSIS — E6609 Other obesity due to excess calories: Secondary | ICD-10-CM | POA: Diagnosis present

## 2016-12-18 DIAGNOSIS — Z825 Family history of asthma and other chronic lower respiratory diseases: Secondary | ICD-10-CM | POA: Diagnosis not present

## 2016-12-18 DIAGNOSIS — Z823 Family history of stroke: Secondary | ICD-10-CM | POA: Diagnosis not present

## 2016-12-18 DIAGNOSIS — Z79899 Other long term (current) drug therapy: Secondary | ICD-10-CM | POA: Diagnosis not present

## 2016-12-18 DIAGNOSIS — M62838 Other muscle spasm: Secondary | ICD-10-CM | POA: Diagnosis present

## 2016-12-18 LAB — URINALYSIS, ROUTINE W REFLEX MICROSCOPIC
Bacteria, UA: NONE SEEN
Bilirubin Urine: NEGATIVE
GLUCOSE, UA: NEGATIVE mg/dL
HGB URINE DIPSTICK: NEGATIVE
KETONES UR: NEGATIVE mg/dL
NITRITE: NEGATIVE
PH: 7 (ref 5.0–8.0)
PROTEIN: 30 mg/dL — AB
Specific Gravity, Urine: 1.026 (ref 1.005–1.030)

## 2016-12-18 LAB — BASIC METABOLIC PANEL
ANION GAP: 8 (ref 5–15)
BUN: 13 mg/dL (ref 6–20)
CHLORIDE: 102 mmol/L (ref 101–111)
CO2: 21 mmol/L — ABNORMAL LOW (ref 22–32)
Calcium: 8.8 mg/dL — ABNORMAL LOW (ref 8.9–10.3)
Creatinine, Ser: 0.85 mg/dL (ref 0.61–1.24)
GFR calc Af Amer: >60 mL/min (ref >60)
GLUCOSE: 156 mg/dL — AB (ref 65–99)
POTASSIUM: 4 mmol/L (ref 3.5–5.1)
Sodium: 131 mmol/L — ABNORMAL LOW (ref 135–145)

## 2016-12-18 LAB — CSF CELL COUNT WITH DIFFERENTIAL
LYMPHS CSF: 2 % — AB (ref 40–80)
MONOCYTE-MACROPHAGE-SPINAL FLUID: 4 % — AB (ref 15–45)
RBC COUNT CSF: 2025 /mm3 — AB
SEGMENTED NEUTROPHILS-CSF: 94 % — AB (ref 0–6)
Tube #: 3
WBC CSF: 9800 /mm3 — AB (ref 0–5)

## 2016-12-18 LAB — CBC WITH DIFFERENTIAL/PLATELET
BASOS ABS: 0 10*3/uL (ref 0.0–0.1)
Basophils Relative: 0 %
Eosinophils Absolute: 0 10*3/uL (ref 0.0–0.7)
Eosinophils Relative: 0 %
HEMATOCRIT: 34.2 % — AB (ref 39.0–52.0)
HEMOGLOBIN: 11.6 g/dL — AB (ref 13.0–17.0)
LYMPHS ABS: 0.8 10*3/uL (ref 0.7–4.0)
LYMPHS PCT: 5 %
MCH: 29.8 pg (ref 26.0–34.0)
MCHC: 33.9 g/dL (ref 30.0–36.0)
MCV: 87.9 fL (ref 78.0–100.0)
Monocytes Absolute: 0.4 10*3/uL (ref 0.1–1.0)
Monocytes Relative: 2 %
NEUTROS ABS: 17.6 10*3/uL — AB (ref 1.7–7.7)
NEUTROS PCT: 93 %
PLATELETS: 331 10*3/uL (ref 150–400)
RBC: 3.89 MIL/uL — AB (ref 4.22–5.81)
RDW: 13.4 % (ref 11.5–15.5)
WBC: 18.8 10*3/uL — AB (ref 4.0–10.5)

## 2016-12-18 LAB — PROTIME-INR
INR: 1.05
Prothrombin Time: 13.8 seconds (ref 11.4–15.2)

## 2016-12-18 LAB — I-STAT CG4 LACTIC ACID, ED: LACTIC ACID, VENOUS: 0.96 mmol/L (ref 0.5–1.9)

## 2016-12-18 LAB — APTT: APTT: 29 s (ref 24–36)

## 2016-12-18 LAB — PROTEIN, CSF: Total  Protein, CSF: 138 mg/dL — ABNORMAL HIGH (ref 15–45)

## 2016-12-18 LAB — GLUCOSE, CSF: GLUCOSE CSF: 44 mg/dL (ref 40–70)

## 2016-12-18 MED ORDER — ALBUTEROL SULFATE (2.5 MG/3ML) 0.083% IN NEBU: 3.0000 mL | INHALATION_SOLUTION | RESPIRATORY_TRACT | Status: DC | PRN

## 2016-12-18 MED ORDER — LORAZEPAM 2 MG/ML IJ SOLN
0.5000 mg | Freq: Four times a day (QID) | INTRAMUSCULAR | Status: DC | PRN
  Administered 2016-12-18 – 2016-12-19 (×2): 0.5 mg via INTRAVENOUS
  Filled 2016-12-18 (×2): qty 1

## 2016-12-18 MED ORDER — ACETAMINOPHEN 160 MG/5ML PO SOLN
650.0000 mg | ORAL | Status: DC | PRN
Start: 2016-12-18 — End: 2016-12-23

## 2016-12-18 MED ORDER — LORAZEPAM BOLUS VIA INFUSION: 0.5000 mg | Freq: Four times a day (QID) | INTRAVENOUS | Status: DC | PRN

## 2016-12-18 MED ORDER — POTASSIUM CHLORIDE 10 MEQ/100ML IV SOLN
10.0000 meq | INTRAVENOUS | Status: AC
  Administered 2016-12-18 (×2): 10 meq via INTRAVENOUS
  Filled 2016-12-18: qty 100

## 2016-12-18 MED ORDER — HYDROMORPHONE HCL 1 MG/ML IJ SOLN
1.0000 mg | INTRAMUSCULAR | Status: DC | PRN
  Administered 2016-12-18 – 2016-12-19 (×5): 1 mg via INTRAVENOUS
  Filled 2016-12-18 (×5): qty 1

## 2016-12-18 MED ORDER — PIPERACILLIN-TAZOBACTAM 3.375 G IVPB
3.3750 g | Freq: Three times a day (TID) | INTRAVENOUS | Status: DC
  Administered 2016-12-18: 3.375 g via INTRAVENOUS
  Filled 2016-12-18 (×3): qty 50

## 2016-12-18 MED ORDER — DEXTROSE 5 % IV SOLN: 2.0000 g | INTRAVENOUS | Status: DC

## 2016-12-18 MED ORDER — ACETAMINOPHEN 650 MG RE SUPP
650.0000 mg | RECTAL | Status: DC | PRN
  Administered 2016-12-18: 650 mg via RECTAL
  Filled 2016-12-18: qty 1

## 2016-12-18 MED ORDER — LIDOCAINE HCL (PF) 1 % IJ SOLN
INTRAMUSCULAR | Status: AC
  Filled 2016-12-18: qty 5

## 2016-12-18 MED ORDER — DEXTROSE 5 % IV SOLN
2.0000 g | Freq: Two times a day (BID) | INTRAVENOUS | Status: DC
  Administered 2016-12-18: 2 g via INTRAVENOUS
  Filled 2016-12-18 (×2): qty 2

## 2016-12-18 MED ORDER — VANCOMYCIN HCL 10 G IV SOLR
1250.0000 mg | Freq: Three times a day (TID) | INTRAVENOUS | Status: DC
  Administered 2016-12-18 – 2016-12-20 (×5): 1250 mg via INTRAVENOUS
  Filled 2016-12-18 (×7): qty 1250

## 2016-12-18 MED ORDER — LIDOCAINE HCL (PF) 1 % IJ SOLN
INTRAMUSCULAR | Status: AC
  Filled 2016-12-18: qty 10

## 2016-12-18 MED ORDER — VANCOMYCIN HCL IN DEXTROSE 1-5 GM/200ML-% IV SOLN: 1000.0000 mg | INTRAVENOUS | Status: DC

## 2016-12-18 MED ORDER — HYDROMORPHONE HCL 1 MG/ML IJ SOLN
1.0000 mg | Freq: Once | INTRAMUSCULAR | Status: AC
  Administered 2016-12-18: 1 mg via INTRAVENOUS
  Filled 2016-12-18: qty 1

## 2016-12-18 MED ORDER — ACETAMINOPHEN 325 MG PO TABS
650.0000 mg | ORAL_TABLET | ORAL | Status: DC | PRN
  Administered 2016-12-19: 650 mg via ORAL
  Filled 2016-12-18: qty 2

## 2016-12-18 MED ORDER — METHOCARBAMOL 1000 MG/10ML IJ SOLN
500.0000 mg | Freq: Four times a day (QID) | INTRAVENOUS | Status: DC | PRN
  Administered 2016-12-18 – 2016-12-19 (×3): 500 mg via INTRAVENOUS
  Filled 2016-12-18 (×5): qty 5

## 2016-12-18 MED ORDER — VANCOMYCIN HCL 10 G IV SOLR
2500.0000 mg | Freq: Once | INTRAVENOUS | Status: AC
  Administered 2016-12-18: 2500 mg via INTRAVENOUS
  Filled 2016-12-18: qty 2500

## 2016-12-18 MED ORDER — SODIUM CHLORIDE 0.9 % IV SOLN
2.0000 g | INTRAVENOUS | Status: DC
  Administered 2016-12-18 – 2016-12-19 (×4): 2 g via INTRAVENOUS
  Filled 2016-12-18 (×7): qty 2000

## 2016-12-18 MED ORDER — PIPERACILLIN-TAZOBACTAM 3.375 G IVPB 30 MIN: 3.3750 g | Freq: Four times a day (QID) | INTRAVENOUS | Status: DC

## 2016-12-18 MED ORDER — SENNOSIDES-DOCUSATE SODIUM 8.6-50 MG PO TABS: 1.0000 | ORAL_TABLET | Freq: Every evening | ORAL | Status: DC | PRN

## 2016-12-18 MED ORDER — PIPERACILLIN-TAZOBACTAM 3.375 G IVPB: 3.3750 g | Freq: Four times a day (QID) | INTRAVENOUS | Status: DC

## 2016-12-18 MED ORDER — SODIUM CHLORIDE 0.9 % IV SOLN
INTRAVENOUS | Status: DC
  Administered 2016-12-18 – 2016-12-20 (×3): via INTRAVENOUS

## 2016-12-18 MED ORDER — ACETAMINOPHEN 500 MG PO TABS
1000.0000 mg | ORAL_TABLET | Freq: Once | ORAL | Status: AC
  Administered 2016-12-18: 1000 mg via ORAL
  Filled 2016-12-18: qty 2

## 2016-12-18 NOTE — Progress Notes (Signed)
**Note De-Identified Ambrosino Obfuscation** CRITICAL VALUE ALERT  Critical Value:  WBC 9800  Date & Time Notied:  12/18/16 @ 2000  Provider Notified: Neurology/[facility]  Orders Received/Actions taken: new orders received/MAR

## 2016-12-18 NOTE — Progress Notes (Signed)
**Note De-Identified Coco Obfuscation** Pharmacy Antibiotic Note  Derek Donaldson is a 46 y.o. male admitted on 12/17/2016 with r/o CNS infection.  Pharmacy has been consulted for vancomycin dosing. Tmax is 100.8 and WBC is elevated at 18.8. SCr is WNL. S/p neurosurgery on 7/26.   Plan: Vancomycin 2500mg  IV x 1 then 1250mg  IV Q8H F/u renal fxn, C&S, clinical status and trough at [facility] MD - Consider changing zosyn to cephalosporin such as ceftriaxone for better CNS penetration  Height: 5\' 10"  (177.8 cm) Weight: 291 lb 8 oz (132.2 kg) IBW/kg (Calculated) : 73  Temp (24hrs), Avg:98.4 F (36.9 C), Min:97.6 F (36.4 C), Max:100.8 F (38.2 C)   Recent Labs Lab 12/17/16 2108 12/18/16 0147 12/18/16 0414  WBC 17.8*  --  18.8*  CREATININE 1.00  --  0.85  LATICACIDVEN  --  0.96  --     Estimated Creatinine Clearance: 148.5 mL/min (by C-G formula based on SCr of 0.85 mg/dL).    Allergies  Allergen Reactions  . Contrast Media [Iodinated Diagnostic Agents] Hives and Swelling    Lip swelling - reaction after contrast dye given in the morning and shrimp eaten at lunch - unable to determine which caused the reaction - 2015 - pt has had contrast dye since then with pre-medication  . Shellfish Allergy Hives and Swelling    Lip swelling - reaction after contrast dye given in the morning and shrimp eaten at lunch - unable to determine which caused the reaction - 2015     Antimicrobials this admission: Vanc 8/4>> Zosyn 8/4>>  Dose adjustments this admission: N/A  Microbiology results: Pending  Thank you for allowing pharmacy to be a part of this patient's care.  Jakylah Bassinger, Rande Lawman 12/18/2016 3:14 PM

## 2016-12-18 NOTE — Progress Notes (Signed)
**Note De-Identified Paulsen Obfuscation** PROGRESS NOTE    Dhanush Jokerst Mcallister  QVZ:[id number] DOB: [birth year] DOA: 12/17/2016 PCP: Dione Housekeeper, MD    Brief Narrative: Derek Donaldson is a 46 y.o. male with medical history significant of HTN, asthma, and recent suboccipital craniectomy with duraplasty by Dr. Cyndy Freeze on 7/26, due to incomplete [place] decompression that had previously treated with AV fistula. Patient was discharged home on 12/11/16 presents with worsening chills back pain neck pain and spasms with low-grade fevers.  Assessment & Plan:    Possible meningitis after procedure:   bacteriaL vs chemical meningitis.  - Neuro checks q3-4 h no change in his neuro exam , no photophobia or major meningeal signs. - NPO for LP soon , I called Neuro and IR , Neuro will attempt the LP first . -Patient is afebrile stable hemodynamically, decision for antibiotics will be decided soon if neurology is okay with that, the patient had fevers and leukocytosis and i recommended empirical coverage, Neurosurgery asked to hold off the Abx till the LP.   H/O Chiari malformation type I: Patient recently underwent suboccipital craniectomy with duraplasty by Dr. Cyndy Freeze on 7/26, due to incomplete Chari decompression with AV fistula    Mild intermittent asthma: Stable. - Albuterol inhaler as needed for shortness of breath/wheezing      DVT prophylaxis: (SCD') Code Status:(Full ) Family Communication: ("discussed with patient") Disposition Plan: (HOME)   Consultants:     Procedures:    Antimicrobials: (specify start and planned stop date. Auto populated tables are space occupying and do not give end dates)  NONE      Subjective:  Still with a neck and back pain no more fevers or chills no nausea or vomiting or photophobia, stable hemodynamically.  Objective: Vitals:   12/18/16 0319 12/18/16 0544 12/18/16 0545 12/18/16 0802  BP: (!) 145/77  (!) 142/78 118/65  Pulse: 80  77 78  Resp: 18  16 18   Temp: 98.6 F (37 C)  98.2 F  (36.8 C) 97.9 F (36.6 C)  TempSrc: Oral  Oral Oral  SpO2: 95%  95% 96%  Weight:  132.2 kg (291 lb 8 oz)    Height:  5\' 10"  (1.778 m)      Intake/Output Summary (Last 24 hours) at 12/18/16 1018 Last data filed at 12/18/16 0053  Gross per 24 hour  Intake               55 ml  Output                0 ml  Net               55 ml   Filed Weights   12/17/16 1926 12/18/16 0544  Weight: 133.8 kg (295 lb) 132.2 kg (291 lb 8 oz)    Examination:  General exam: Appears calm and comfortable  Respiratory system: Clear to auscultation. Respiratory effort normal. Cardiovascular system: S1 & S2 heard, RRR. No JVD, murmurs, rubs, gallops or clicks. No pedal edema. Gastrointestinal system: Abdomen is nondistended, soft and nontender. No organomegaly or masses felt. Normal bowel sounds heard. Central nervous system: Alert and oriented. No focal neurological deficitS , MENINGEAL SIGNS are negative. Extremities: Symmetric 5 x 5 power. Skin: No rashes, lesions or ulcers Psychiatry: Judgement and insight appear normal. Mood & affect appropriate.     Data Reviewed: I have personally reviewed following labs and imaging studies  CBC:  Recent Labs Lab 12/17/16 2108 12/18/16 0414  WBC 17.8* 18.8*  NEUTROABS 14.0* 17.6* **Note De-Identified Kitchen Obfuscation** HGB 12.1* 11.6*  HCT 36.1* 34.2*  MCV 88.7 87.9  PLT 365 211   Basic Metabolic Panel:  Recent Labs Lab 12/17/16 2108 12/18/16 0414  NA 132* 131*  K 3.4* 4.0  CL 99* 102  CO2 26 21*  GLUCOSE 109* 156*  BUN 15 13  CREATININE 1.00 0.85  CALCIUM 9.2 8.8*   GFR: Estimated Creatinine Clearance: 148.5 mL/min (by C-G formula based on SCr of 0.85 mg/dL). Liver Function Tests: No results for input(s): AST, ALT, ALKPHOS, BILITOT, PROT, ALBUMIN in the last 168 hours. No results for input(s): LIPASE, AMYLASE in the last 168 hours. No results for input(s): AMMONIA in the last 168 hours. Coagulation Profile: No results for input(s): INR, PROTIME in the last 168  hours. Cardiac Enzymes: No results for input(s): CKTOTAL, CKMB, CKMBINDEX, TROPONINI in the last 168 hours. BNP (last 3 results) No results for input(s): PROBNP in the last 8760 hours. HbA1C: No results for input(s): HGBA1C in the last 72 hours. CBG: No results for input(s): GLUCAP in the last 168 hours. Lipid Profile: No results for input(s): CHOL, HDL, LDLCALC, TRIG, CHOLHDL, LDLDIRECT in the last 72 hours. Thyroid Function Tests: No results for input(s): TSH, T4TOTAL, FREET4, T3FREE, THYROIDAB in the last 72 hours. Anemia Panel: No results for input(s): VITAMINB12, FOLATE, FERRITIN, TIBC, IRON, RETICCTPCT in the last 72 hours. Urine analysis:    Component Value Date/Time   COLORURINE YELLOW 12/18/2016 0214   APPEARANCEUR CLOUDY (A) 12/18/2016 0214   APPEARANCEUR Clear 08/13/2015 0000   LABSPEC 1.026 12/18/2016 0214   PHURINE 7.0 12/18/2016 0214   GLUCOSEU NEGATIVE 12/18/2016 0214   HGBUR NEGATIVE 12/18/2016 0214   BILIRUBINUR NEGATIVE 12/18/2016 0214   BILIRUBINUR Negative 08/13/2015 0000   KETONESUR NEGATIVE 12/18/2016 0214   PROTEINUR 30 (A) 12/18/2016 0214   UROBILINOGEN 0.2 07/16/2014 0935   NITRITE NEGATIVE 12/18/2016 0214   LEUKOCYTESUR TRACE (A) 12/18/2016 0214   LEUKOCYTESUR Negative 08/13/2015 0000   Sepsis Labs: @LABRCNTIP (procalcitonin:4,lacticidven:4)  ) Recent Results (from the past 240 hour(s))  MRSA PCR Screening     Status: None   Collection Time: 12/09/16 11:39 PM  Result Value Ref Range Status   MRSA by PCR NEGATIVE NEGATIVE Final    Comment:        The GeneXpert MRSA Assay (FDA approved for NASAL specimens only), is one component of a comprehensive MRSA colonization surveillance program. It is not intended to diagnose MRSA infection nor to guide or monitor treatment for MRSA infections.          Radiology Studies: Dg Chest 2 View  Result Date: 12/18/2016 CLINICAL DATA:  46 year old male with shortness of breath EXAM: CHEST  2 VIEW  COMPARISON:  Prior chest x-ray 10/20/2015 FINDINGS: Cardiac and mediastinal contours are within normal limits. Mild streaky airspace opacities in both lower lobes. Trace bronchitic changes are similar compared to prior. No pneumothorax, pleural effusion or focal airspace consolidation. No acute osseous abnormality. IMPRESSION: Mild streaky airspace opacities in both lung bases favored to reflect subsegmental atelectasis. Otherwise, no acute cardiopulmonary process. Electronically Signed   By: Jacqulynn Cadet M.D.   On: 12/18/2016 09:13   Ct Head Wo Contrast  Result Date: 12/17/2016 CLINICAL DATA:  Initial evaluation for acute severe headache, worse headache of life. EXAM: CT HEAD WITHOUT CONTRAST TECHNIQUE: Contiguous axial images were obtained from the base of the skull through the vertex without intravenous contrast. COMPARISON:  Prior MRI from 11/29/2016. FINDINGS: Brain: Sequelae of prior embolization of large AV fistula involving the **Note De-Identified Pesantez Obfuscation** left transverse sinus seen. Additionally, patient is status post suboccipital decompressive craniectomy for decompression of Chiari 1 malformation. Evaluation of the basilar cisterns and posterior fossa as well as the left cerebral hemisphere limited by streak artifact. No evidence for acute intracranial hemorrhage. No acute large vessel territory infarct. No mass lesion, midline shift, or mass effect. No hydrocephalus. No definite extra-axial fluid collection. Vascular: Intracranial vasculature poorly evaluated due to extensive streak artifact. Skull: Embolization material present at the age left temporal and occipital calvarium, extending into the extracranial soft tissues. Skin staple in place at the right frontotemporal scalp. Small collection measuring approximately 5.0 x 3.3 cm present at the Chiari 1 decompression site (series 3, image 8), most likely postoperative seroma. Sinuses/Orbits: Globes and orbital soft tissues within normal limits. Mucosal thickening present  within the inferior maxillary sinuses. Paranasal sinuses are otherwise clear. Mastoid air cells grossly clear. IMPRESSION: 1. 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 2. No other definite acute intracranial process identified. Electronically Signed   By: Jeannine Boga M.D.   On: 12/17/2016 21:50        Scheduled Meds: Continuous Infusions: . sodium chloride 75 mL/hr at 12/18/16 0349  . methocarbamol (ROBAXIN)  IV       LOS: 0 days    Time spent: 35 minutes .    Waldron Session, MD [facility]   If 7PM-7AM, please contact night-coverage [email or web address] Password [email or web address] 12/18/2016, 10:18 AM

## 2016-12-18 NOTE — Consult Note (Signed)
**Note De-Identified [place] Obfuscation** Subjective:  The patient is a 46 year old white male patient of Dr. Hewitt Shorts. He had a embolization of dural fistulas. Dr. Cyndy Freeze performed a suboccipital craniectomy, cervical laminectomy and duraplasty on him on 12/09/2016. The patient was subsequently discharged. The patient's was doing well until 12/16/16 when he began having increasing pain. According to the patient Dr. Cyndy Freeze started him on Decadron. The patient's pain worsened yesterday and he went to the ER. A CAT scan was obtained which demonstrated expected postoperative changes. The patient was admitted for concern of meningitis.  Presently the patient's wife is at bedside. There has been no drainage from the wound. He continues complain of neck pain with decreased range of motion.  Objective: Vital signs in last 24 hours: Temp:  [97.7 F (36.5 C)-100.8 F (38.2 C)] 97.9 F (36.6 C) (08/04 0802) Pulse Rate:  [75-90] 78 (08/04 0802) Resp:  [16-25] 18 (08/04 0802) BP: (117-159)/(65-91) 118/65 (08/04 0802) SpO2:  [89 %-100 %] 96 % (08/04 0802) Weight:  [132.2 kg (291 lb 8 oz)-133.8 kg (295 lb)] 132.2 kg (291 lb 8 oz) (08/04 0544)  Intake/Output from previous day: 08/03 0701 - 08/04 0700 In: 55 [IV Piggyback:55] Out: -  Intake/Output this shift: No intake/output data recorded.  Physical exam the patient is alert and oriented 3. He is moving all 4 extremities well. His speech is normal. His neck is sore but otherwise he looks well. His suboccipital craniectomy incision is healing well. There is no erythema or drainage.  Lab Results:  Recent Labs  12/17/16 2108 12/18/16 0414  WBC 17.8* 18.8*  HGB 12.1* 11.6*  HCT 36.1* 34.2*  PLT 365 331   BMET  Recent Labs  12/17/16 2108 12/18/16 0414  NA 132* 131*  K 3.4* 4.0  CL 99* 102  CO2 26 21*  GLUCOSE 109* 156*  BUN 15 13  CREATININE 1.00 0.85  CALCIUM 9.2 8.8*    Studies/Results: Dg Chest 2 View  Result Date: 12/18/2016 CLINICAL DATA:  46 year old male with  shortness of breath EXAM: CHEST  2 VIEW COMPARISON:  Prior chest x-ray 10/20/2015 FINDINGS: Cardiac and mediastinal contours are within normal limits. Mild streaky airspace opacities in both lower lobes. Trace bronchitic changes are similar compared to prior. No pneumothorax, pleural effusion or focal airspace consolidation. No acute osseous abnormality. IMPRESSION: Mild streaky airspace opacities in both lung bases favored to reflect subsegmental atelectasis. Otherwise, no acute cardiopulmonary process. Electronically Signed   By: Jacqulynn Cadet M.D.   On: 12/18/2016 09:13   Ct Head Wo Contrast  Result Date: 12/17/2016 CLINICAL DATA:  Initial evaluation for acute severe headache, worse headache of life. EXAM: CT HEAD WITHOUT CONTRAST TECHNIQUE: Contiguous axial images were obtained from the base of the skull through the vertex without intravenous contrast. COMPARISON:  Prior MRI from 11/29/2016. 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 Additionally, patient is status post suboccipital decompressive craniectomy for decompression of Chiari 1 malformation. Evaluation of the basilar cisterns and posterior fossa as well as the left cerebral hemisphere limited by streak artifact. No evidence for acute intracranial hemorrhage. No acute large vessel territory infarct. No mass lesion, midline shift, or mass effect. No hydrocephalus. No definite extra-axial fluid collection. Vascular: Intracranial vasculature poorly evaluated due to extensive streak artifact. Skull: Embolization material present at the age left temporal and occipital calvarium, extending into the extracranial soft tissues. Skin staple in place at the right frontotemporal scalp. Small collection measuring approximately 5.0 x 3.3 cm **Note De-Identified [place] Obfuscation** present at the Chiari 1 decompression site (series 3, image 8), most likely postoperative seroma. Sinuses/Orbits: Globes and orbital soft tissues within  normal limits. Mucosal thickening present within the inferior maxillary sinuses. Paranasal sinuses are otherwise clear. Mastoid air cells grossly clear. IMPRESSION: 1. 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 2. No other definite acute intracranial process identified. Electronically Signed   By: Jeannine Boga M.D.   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 However the only way to know for sure he doesn't have a bacterial meningitis is a spinal tap, particularly in light of his leukocytosis and low-grade fever. I explained the procedure to the patient and his wife. I have answered all their questions.  The plan is to have a spinal tap today, start empiric antibiotics, and await the culture results.  LOS: 0 days     Berma Harts D 12/18/2016, 10:32 AM

## 2016-12-18 NOTE — Procedures (Signed)
**Note De-Identified [place] Obfuscation** Successful fluoroscopic guided LP with 11 cc of cloudy CSF removed and sent to the lab.  Patient tolerated procedure well and was sent back to room.

## 2016-12-18 NOTE — Consult Note (Signed)
**Note De-Identified [place] Obfuscation** NEURO HOSPITALIST CONSULT NOTE   Requesting physician: Dr. Burnis Medin  Reason for Consult: LP  History obtained from:  Chart and patient  HPI:                                                                                                                                          Derek Donaldson is an 46 y.o. male with a past medical history that is significant for a recent suboccipital craniectomy with duraplasty on 12/09/16, due to incomplete [place] decompression that had been previously treated with AV fistula. Derek Donaldson was discharged on 12/11/16 but returned eight days later with worsening chills, low-grade fever, muscle spasms of the neck, feeling as though his throat was closing and a headache. Neurosurgery suggested to hold on providing antibiotics until an LP could be performed by IR on 12/18/16, as they suspect that this may be a chemical meningitis. Blood cultures were obtained in the ED and Derek Donaldson was treated symptomatically.  On my visit, Derek Donaldson was laying still in bed. He complains of a 2-3/10 headache and persistent neck muscle spasms. He states that he occasionally feels fluid run down the back of his throat but denies CSF otorrhea and rhinorrhea.  Pertinent Imaging/Diagnostics CT head 12/17/16: Postoperative changes from prior embolization of left transverse sinus dural AVF as well as decompressive suboccipital craniectomy for Chiari 1 decompression  Past Medical History:  Diagnosis Date  . Asthma    Albuterol inhaler prn;just a small touch  . Cancer ([place]) 2010   skin ( unsure of the pathology)- removed  . Chiari malformation   . Essential hypertension, benign    takes Metoprolol,Diovan,and Amlodipine daily  . Headache    migraines in the past  . History of bronchitis   . History of kidney stones    passed on his own  . Mixed hyperlipidemia    takes Fenofibrate daily  . Muscle spasm    takes Flexeril daily as needed  . Numbness    left arm from chiari  malformation  . Pneumonia 2005   hx of  . Shortness of breath    with exertion    Past Surgical History:  Procedure Laterality Date  . CEREBRAL ANGIOGRAM  12/2013  . CHOLECYSTECTOMY  11/13  . ERCP  03/21/2012   Procedure: ENDOSCOPIC RETROGRADE CHOLANGIOPANCREATOGRAPHY (ERCP);  Surgeon: Jeryl Columbia, MD;  Location: [facility];  Service: Endoscopy;  Laterality: N/A;  aqntibiotics and type and screen  . IR GENERIC HISTORICAL  07/20/2016   IR ANGIO INTRA EXTRACRAN SEL COM CAROTID INNOMINATE BILAT MOD SED 07/20/2016 Luanne Bras, MD [facility]  . IR GENERIC HISTORICAL  07/20/2016   IR ANGIO VERTEBRAL SEL VERTEBRAL UNI R MOD SED 07/20/2016 Luanne Bras, MD [facility]  . IR GENERIC **Note De-Identified [place] Obfuscation** HISTORICAL  07/20/2016   IR ANGIO VERTEBRAL SEL SUBCLAVIAN INNOMINATE UNI L MOD SED 07/20/2016 Luanne Bras, MD [facility]  . MRI     x 2  . RADIOLOGY WITH ANESTHESIA N/A 02/20/2014   Procedure: RADIOLOGY WITH ANESTHESIA EMBOLIZATION;  Surgeon: Rob Hickman, MD;  Location: [facility];  Service: Radiology;  Laterality: N/A;  . RADIOLOGY WITH ANESTHESIA N/A 04/29/2014   Procedure: STAGE TWO EMBOLIZATION;  Surgeon: Luanne Bras, MD;  Location: [place];  Service: Radiology;  Laterality: N/A;  . RADIOLOGY WITH ANESTHESIA N/A 06/05/2014   Procedure: Embolization;  Surgeon: Rob Hickman, MD;  Location: [place] [facility];  Service: Radiology;  Laterality: N/A;  . RADIOLOGY WITH ANESTHESIA N/A 07/15/2014   Procedure: RADIOLOGY WITH ANESTHESIA/EMBOLIZATION;  Surgeon: Rob Hickman, MD;  Location: [place];  Service: Radiology;  Laterality: N/A;  . STAPEDECTOMY  04/23/2003   Revision left stapedectomy  . SUBOCCIPITAL CRANIECTOMY CERVICAL LAMINECTOMY N/A 12/21/2013   Procedure: Aborted Cranioplasty For Chiari Malformation (Could not complete procedure);  Surgeon: Ashok Pall, MD;  Location: [place] [facility];  Service: Neurosurgery;  Laterality: N/A;  Aborted Cranioplasty For Chiari Malformation  . SUBOCCIPITAL  CRANIECTOMY CERVICAL LAMINECTOMY N/A 12/09/2016   Procedure: CHIARI DECOMPRESSION;  Surgeon: Ashok Pall, MD;  Location: [place];  Service: Neurosurgery;  Laterality: N/A;  Posterior   . VASECTOMY      Family History  Problem Relation Age of Onset  . Aneurysm Father   . Asthma Mother   . Asthma Brother   . Heart failure Maternal Grandmother   . Cancer Maternal Grandfather        unknown type  . Stroke Paternal Grandfather     Social History:  reports that he has never smoked. He has never used smokeless tobacco. He reports that he does not drink alcohol or use drugs.  Allergies  Allergen Reactions  . Contrast Media [Iodinated Diagnostic Agents] Hives and Swelling    Lip swelling - reaction after contrast dye given in the morning and shrimp eaten at lunch - unable to determine which caused the reaction - 2015 - pt has had contrast dye since then with pre-medication  . Shellfish Allergy Hives and Swelling    Lip swelling - reaction after contrast dye given in the morning and shrimp eaten at lunch - unable to determine which caused the reaction - 2015     MEDICATIONS:                                                                                                                   Current Meds  Medication Sig  . acetaminophen (TYLENOL) 500 MG tablet Take 1,000 mg by mouth every 6 (six) hours as needed for headache (pain).  Marland Kitchen albuterol (PROVENTIL HFA;VENTOLIN HFA) 108 (90 BASE) MCG/ACT inhaler Inhale 2 puffs into the lungs every 4 (four) hours as needed for wheezing or shortness of breath.  Marland Kitchen amLODipine (NORVASC) 10 MG tablet Take 10 mg by mouth daily before breakfast.   . aspirin EC 81 **Note De-Identified [place] Obfuscation** MG tablet Take 81 mg by mouth daily.  Marland Kitchen EPINEPHrine 0.3 mg/0.3 mL IJ SOAJ injection Inject 0.3 mg into the muscle once as needed (severe allergic reaction).   . fenofibrate micronized (ANTARA) 130 MG capsule Take 130 mg by mouth daily before breakfast.  . HYDROcodone-acetaminophen (NORCO/VICODIN) 5-325  MG tablet Take 1 tablet by mouth every 6 (six) hours as needed for moderate pain.  Marland Kitchen ibuprofen (ADVIL,MOTRIN) 200 MG tablet Take 400 mg by mouth every 6 (six) hours as needed for mild pain or moderate pain.  . metoprolol succinate (TOPROL-XL) 25 MG 24 hr tablet Take 25 mg by mouth daily before breakfast.   . potassium chloride SA (K-DUR,KLOR-CON) 20 MEQ tablet Take 1 tablet (20 mEq total) by mouth daily.  Marland Kitchen tiZANidine (ZANAFLEX) 4 MG tablet Take 1 tablet (4 mg total) by mouth every 6 (six) hours as needed for muscle spasms.  . valsartan-hydrochlorothiazide (DIOVAN-HCT) 320-25 MG per tablet Take 1 tablet by mouth daily before breakfast.      Review Of Systems:                                                                                                           History obtained from the patient  General: Negative for chills, fever - last experienced yesterday Psychological: Negative for any known behavioral disorder Ophthalmic: Negative for blurry or double vision, loss of vision in any field ENT: Negative for abrupt loss of hearing, tinnitus or vertigo Respiratory: Negative for cough, shortness of breath or wheezing Cardiovascular: Negative for dyspnea on exertion, edema or irregular heartbeat Gastrointestinal: Negative for abdominal pain, Genito-Urinary: Negative for incontinence Musculoskeletal: Positive for neck stiffness Neurological: As noted in HPI Dermatological: Negative for rash or skin changes, numbness or tingling  Blood pressure 118/65, pulse 78, temperature 97.9 F (36.6 C), temperature source Oral, resp. rate 18, height 5\' 10"  (1.778 m), weight 132.2 kg (291 lb 8 oz), SpO2 96 %.   Physical Examination:                                                                                                      General: WD obese male. Appears calm and comfortable in bed. Wife at bedside. HEENT:  Normocephalic, no lesions, without obvious abnormality.  Normal external eye and  conjunctiva.  Normal external ears. Normal external nose, mucus membranes and septum.  Normal pharynx. Cardiovascular: S1, S2 normal, pulses palpable throughout   Pulmonary: Chest clear, unlabored breathing Abdomen: obese, soft, nontender Extremities/Musculoskeletal: no joint deformities, effusion, or inflammation and no edema. No joint tenderness, deformity or swelling. Positive for neck spasms and muscular tenderness Skin: warm and dry, **Note De-Identified [place] Obfuscation** no hyperpigmentation, vitiligo, or suspicious lesions  Neurological Examination:                                                                                               Mental Status: Derek Donaldson is alert, oriented x 4, thought content appropriate.  Speech fluent without evidence of aphasia. Able to follow 3-step commands without difficulty. Cranial Nerves: II: Visual fields grossly normal, pupils are equal, round, reactive to light. III,IV, VI: Ptosis not present, extra-ocular muscle movements intact bilaterally V,VII: Smile and eyebrow raise is symmetric. Facial light touch and pinprick sensation intact bilaterally VIII: Hearing grossly intact to loud voice IX,X: Uvula and palate rise symmetrically XI: Movement of the head ~20 degrees to the left and right XII: Midline tongue extension Motor: Right :     Upper extremity   5/5   Left:     Upper extremity   5/5          Lower extremity   5/5     Lower extremity   5/5 Pronator drift not present Sensory: Pinprick and light touch intact throughout, bilaterally Deep Tendon Reflexes: 2+ and symmetric throughout Plantars: Right: downgoing   Left: downgoing Cerebellar: Finger-to-nose test without evidence of dysmetria or ataxia. Gait: Deferred   Lab Results: Basic Metabolic Panel:  Recent Labs Lab 12/17/16 2108 12/18/16 0414  NA 132* 131*  K 3.4* 4.0  CL 99* 102  CO2 26 21*  GLUCOSE 109* 156*  BUN 15 13  CREATININE 1.00 0.85  CALCIUM 9.2 8.8*    Liver Function Tests: No results for  input(s): AST, ALT, ALKPHOS, BILITOT, PROT, ALBUMIN in the last 168 hours. No results for input(s): LIPASE, AMYLASE in the last 168 hours. No results for input(s): AMMONIA in the last 168 hours.  CBC:  Recent Labs Lab 12/17/16 2108 12/18/16 0414  WBC 17.8* 18.8*  NEUTROABS 14.0* 17.6*  HGB 12.1* 11.6*  HCT 36.1* 34.2*  MCV 88.7 87.9  PLT 365 331    Cardiac Enzymes: No results for input(s): CKTOTAL, CKMB, CKMBINDEX, TROPONINI in the last 168 hours.  Lipid Panel: No results for input(s): CHOL, TRIG, HDL, CHOLHDL, VLDL, LDLCALC in the last 168 hours.  CBG: No results for input(s): GLUCAP in the last 168 hours.  Microbiology: Results for orders placed or performed during the hospital encounter of 12/09/16  MRSA PCR Screening     Status: None   Collection Time: 12/09/16 11:39 PM  Result Value Ref Range Status   MRSA by PCR NEGATIVE NEGATIVE Final    Comment:        The GeneXpert MRSA Assay (FDA approved for NASAL specimens only), is one component of a comprehensive MRSA colonization surveillance program. It is not intended to diagnose MRSA infection nor to guide or monitor treatment for MRSA infections.     Coagulation Studies: No results for input(s): LABPROT, INR in the last 72 hours.  Imaging: Dg Chest 2 View  Result Date: 12/18/2016 CLINICAL DATA:  46 year old male with shortness of breath EXAM: CHEST  2 VIEW COMPARISON:  Prior chest x-ray 10/20/2015 FINDINGS: Cardiac and mediastinal contours are **Note De-Identified [place] Obfuscation** within normal limits. Mild streaky airspace opacities in both lower lobes. Trace bronchitic changes are similar compared to prior. No pneumothorax, pleural effusion or focal airspace consolidation. No acute osseous abnormality. IMPRESSION: Mild streaky airspace opacities in both lung bases favored to reflect subsegmental atelectasis. Otherwise, no acute cardiopulmonary process. Electronically Signed   By: Jacqulynn Cadet M.D.   On: 12/18/2016 09:13   Ct Head Wo  Contrast  Result Date: 12/17/2016 CLINICAL DATA:  Initial evaluation for acute severe headache, worse headache of life. EXAM: CT HEAD WITHOUT CONTRAST TECHNIQUE: Contiguous axial images were obtained from the base of the skull through the vertex without intravenous contrast. COMPARISON:  Prior MRI from 11/29/2016. 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 Additionally, patient is status post suboccipital decompressive craniectomy for decompression of Chiari 1 malformation. Evaluation of the basilar cisterns and posterior fossa as well as the left cerebral hemisphere limited by streak artifact. No evidence for acute intracranial hemorrhage. No acute large vessel territory infarct. No mass lesion, midline shift, or mass effect. No hydrocephalus. No definite extra-axial fluid collection. Vascular: Intracranial vasculature poorly evaluated due to extensive streak artifact. Skull: Embolization material present at the age left temporal and occipital calvarium, extending into the extracranial soft tissues. Skin staple in place at the right frontotemporal scalp. Small collection measuring approximately 5.0 x 3.3 cm present at the Chiari 1 decompression site (series 3, image 8), most likely postoperative seroma. Sinuses/Orbits: Globes and orbital soft tissues within normal limits. Mucosal thickening present within the inferior maxillary sinuses. Paranasal sinuses are otherwise clear. Mastoid air cells grossly clear. IMPRESSION: 1. 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 2. No other definite acute intracranial process identified. Electronically Signed   By: Jeannine Boga M.D.   On: 12/17/2016 21:50    Assessment: 46 year old male with recent suboccipital craniectomy with duraplasty arrived [facility] with worsening fever, chills, neck  spasms. Concern for bacterial vs postoperative aseptic (chemical) meningitis, a common complication of neurosurgery. Derek Donaldson is alert and oriented, thus suspicion for encephalitis is low.  Lumbar puncture would allow [place] to culture and gram stain CSF, which may help in determining the etiology of his condition. I had a discussion about the procedure, benefits and risks of an LP and Derek Donaldson is amenable to the procedure. Based on how much pain he is in and his body habitus, it would be safest to have his LP performed under fluoroscopy. Because he is clinically and hemodynamically stable and afebrile currently, until the LP is done, we agree with neurosurgery to hold off on antibiotic use.   Derek Augusta PA-C [facility]  12/18/2016, 10:18 AM  NEUROHOSPITALIST ADDENDUM Seen and examined the patient this AM. Consulted for LP.   Derek Donaldson is an 45 y.o. male with a past medical history that is significant for a recent suboccipital craniectomy with duraplasty on 12/09/16. He has developed progressive headache since Thursday which has gotten more severe associated with severe neck pain.On examination is alert, perfectly oriented. He has no neurological deficits on examination and normal cranial nerve examination. It is less likely he has bacterial meningitis and more likely to be aseptic meningitis, a common complication of decompression procedure and duraplasty.  I assessed the patient and he is in severe pain and obese and I will unlikely succeed in obtaining a successful LP.  Please consult fluro **Note De-Identified [place] Obfuscation** for LP.  Ok to start empiric antibiotics as recommended by NS.  Karena Addison Alitza Cowman MD [facility] [phone]  If 7pm to 7am, please call on call as listed on AMION.

## 2016-12-18 NOTE — H&P (Addendum)
**Note De-Identified [place] Obfuscation** History and Physical    Sanjay Broadfoot Lupu DDU:[id number] DOB: 21-Aug-1970 DOA: 12/17/2016  Referring MD/NP/PA: Dr. Oleta Mouse PCP: Dione Housekeeper, MD  Patient coming from: Home  Chief Complaint:   HPI: Derek Donaldson is a 46 y.o. male with medical history significant of HTN, asthma, and recent suboccipital craniectomy with duraplasty by Dr. Cyndy Freeze on 7/Derek, due to incomplete [place] decompression that had previously treated with AV fistula. Patient was discharged home on 728. Since that time patient that he was initially feeling well treating symptoms with as needed Tylenol. However, 2 days ago he developed worsening chills and headache. With the headache he denies any significant photophobia, sensitivity to sound, changes in vision , focal weakness, or confusion. Associated symptoms include significant muscle spasms in his neck, feelings of his throat closing up, and low-grade fever up to 99.57F at home. He tried using medications of tizanidine and hydrocodone without significant relief of symptoms. As advised at discharge, he came back to hospital for further evaluation due to worsening headache symptoms. Patient denies any signs of redness or drainage along the healing surgical site.  ED Course: On admission into the emergency department patient was seen to be febrile up to 100.69F rectally, pulse 78-90, respirations 16-25, blood pressures 159/77, and O2 saturation noted as well as 89% on room air. Labs revealed WBC 17.8, hemoglobin 12.1, sodium 132, potassium 3.7, and lactic acid 0.96. Urinalysis showed no bacteria, trace leukocytes, and 6-30 WBCs. The On-call neurosurgeon Dr. Arnoldo Morale was notified and recommended holding off on antibiotics until interventional radiology lumbar puncture could be obtained in a.m. Patient was given Tylenol, 10 mg of Decadron, 1 mg IV Dilaudid, 500 mg IV of Robaxin, 10 mg of Reglan, and blood cultures were obtained.  Review of Systems: Review of Systems  Constitutional: Positive for  chills and malaise/fatigue.  HENT: Negative for ear discharge and nosebleeds.   Eyes: Negative for photophobia and pain.  Respiratory: Negative for shortness of breath.   Cardiovascular: Negative for chest pain and palpitations.  Gastrointestinal: Negative for nausea and vomiting.  Genitourinary: Negative for frequency and urgency.  Musculoskeletal: Positive for back pain and neck pain. Negative for falls.  Skin: Negative for itching and rash.  Neurological: Positive for headaches. Negative for sensory change and focal weakness.  Psychiatric/Behavioral: Negative for memory loss and substance abuse.    Past Medical History:  Diagnosis Date  . Asthma    Albuterol inhaler prn;just a small touch  . Cancer ([place]) 2010   skin ( unsure of the pathology)- removed  . Chiari malformation   . Essential hypertension, benign    takes Metoprolol,Diovan,and Amlodipine daily  . Headache    migraines in the past  . History of bronchitis   . History of kidney stones    passed on his own  . Mixed hyperlipidemia    takes Fenofibrate daily  . Muscle spasm    takes Flexeril daily as needed  . Numbness    left arm from chiari malformation  . Pneumonia 2005   hx of  . Shortness of breath    with exertion    Past Surgical History:  Procedure Laterality Date  . CEREBRAL ANGIOGRAM  12/2013  . CHOLECYSTECTOMY  11/13  . ERCP  03/21/2012   Procedure: ENDOSCOPIC RETROGRADE CHOLANGIOPANCREATOGRAPHY (ERCP);  Surgeon: Jeryl Columbia, MD;  Location: [facility];  Service: Endoscopy;  Laterality: N/A;  aqntibiotics and type and screen  . IR GENERIC HISTORICAL  07/20/2016   IR ANGIO INTRA EXTRACRAN SEL **Note De-Identified [place] Obfuscation** COM CAROTID INNOMINATE BILAT MOD SED 07/20/2016 Luanne Bras, MD [facility]  . IR GENERIC HISTORICAL  07/20/2016   IR ANGIO VERTEBRAL SEL VERTEBRAL UNI R MOD SED 07/20/2016 Luanne Bras, MD [facility]  . IR GENERIC HISTORICAL  07/20/2016   IR ANGIO VERTEBRAL SEL SUBCLAVIAN INNOMINATE UNI L MOD SED  07/20/2016 Luanne Bras, MD [facility]  . MRI     x 2  . RADIOLOGY WITH ANESTHESIA N/A 02/20/2014   Procedure: RADIOLOGY WITH ANESTHESIA EMBOLIZATION;  Surgeon: Rob Hickman, MD;  Location: [place];  Service: Radiology;  Laterality: N/A;  . RADIOLOGY WITH ANESTHESIA N/A 04/29/2014   Procedure: STAGE TWO EMBOLIZATION;  Surgeon: Luanne Bras, MD;  Location: [place];  Service: Radiology;  Laterality: N/A;  . RADIOLOGY WITH ANESTHESIA N/A 06/05/2014   Procedure: Embolization;  Surgeon: Rob Hickman, MD;  Location: [place];  Service: Radiology;  Laterality: N/A;  . RADIOLOGY WITH ANESTHESIA N/A 07/15/2014   Procedure: RADIOLOGY WITH ANESTHESIA/EMBOLIZATION;  Surgeon: Rob Hickman, MD;  Location: [place];  Service: Radiology;  Laterality: N/A;  . STAPEDECTOMY  04/23/2003   Revision left stapedectomy  . SUBOCCIPITAL CRANIECTOMY CERVICAL LAMINECTOMY N/A 12/21/2013   Procedure: Aborted Cranioplasty For Chiari Malformation (Could not complete procedure);  Surgeon: Ashok Pall, MD;  Location: [facility];  Service: Neurosurgery;  Laterality: N/A;  Aborted Cranioplasty For Chiari Malformation  . SUBOCCIPITAL CRANIECTOMY CERVICAL LAMINECTOMY N/A 7/Derek/2018   Procedure: CHIARI DECOMPRESSION;  Surgeon: Ashok Pall, MD;  Location: [place];  Service: Neurosurgery;  Laterality: N/A;  Posterior   . VASECTOMY       reports that he has never smoked. He has never used smokeless tobacco. He reports that he does not drink alcohol or use drugs.  Allergies  Allergen Reactions  . Contrast Media [Iodinated Diagnostic Agents] Hives and Swelling    Lip swelling - reaction after contrast dye given in the morning and shrimp eaten at lunch - unable to determine which caused the reaction - 2015 - pt has had contrast dye since then with pre-medication  . Shellfish Allergy Hives and Swelling    Lip swelling - reaction after contrast dye given in the morning and shrimp eaten at lunch - unable to determine  which caused the reaction - 2015     Family History  Problem Relation Age of Onset  . Aneurysm Father   . Asthma Mother   . Asthma Brother   . Heart failure Maternal Grandmother   . Cancer Maternal Grandfather        unknown type  . Stroke Paternal Grandfather     Prior to Admission medications   Medication Sig Start Date End Date Taking? Authorizing Provider  acetaminophen (TYLENOL) 500 MG tablet Take 1,000 mg by mouth every 6 (six) hours as needed for headache (pain).   Yes [provider]  albuterol (PROVENTIL HFA;VENTOLIN HFA) 108 (90 BASE) MCG/ACT inhaler Inhale 2 puffs into the lungs every 4 (four) hours as needed for wheezing or shortness of breath.   Yes [provider]  amLODipine (NORVASC) 10 MG tablet Take 10 mg by mouth daily before breakfast.    Yes [provider]  aspirin EC 81 MG tablet Take 81 mg by mouth daily.   Yes [provider]  EPINEPHrine 0.3 mg/0.3 mL IJ SOAJ injection Inject 0.3 mg into the muscle once as needed (severe allergic reaction).    Yes [provider]  fenofibrate micronized (ANTARA) 130 MG capsule Take 130 mg by **Note De-Identified [place] Obfuscation** mouth daily before breakfast.   Yes [provider]  HYDROcodone-acetaminophen (NORCO/VICODIN) 5-325 MG tablet Take 1 tablet by mouth every 6 (six) hours as needed for moderate pain. 12/11/16  Yes Ashok Pall, MD  ibuprofen (ADVIL,MOTRIN) 200 MG tablet Take 400 mg by mouth every 6 (six) hours as needed for mild pain or moderate pain.   Yes [provider]  metoprolol succinate (TOPROL-XL) 25 MG 24 hr tablet Take 25 mg by mouth daily before breakfast.    Yes [provider]  potassium chloride SA (K-DUR,KLOR-CON) 20 MEQ tablet Take 1 tablet (20 mEq total) by mouth daily. 10/20/15  Yes Milton Ferguson, MD  tiZANidine (ZANAFLEX) 4 MG tablet Take 1 tablet (4 mg total) by mouth every 6 (six) hours as needed for muscle spasms. 12/11/16  Yes Ashok Pall, MD    valsartan-hydrochlorothiazide (DIOVAN-HCT) 320-25 MG per tablet Take 1 tablet by mouth daily before breakfast.    Yes [provider]    Physical Exam:  Constitutional: Middle aged male who appears to be in moderate discomfort Vitals:   12/17/16 2356 12/18/16 0045 12/18/16 0110 12/18/16 0200  BP: (!) 147/87 (!) 149/91  (!) 159/77  Pulse: 90 88  82  Resp: 16   (!) 25  Temp:   (!) 100.8 F (38.2 C)   TempSrc:   Rectal   SpO2: 92% (!) 89%  (!) 89%  Weight:      Height:       Eyes: PERRL, lids and conjunctivae normal ENMT: Mucous membranes are moist. Posterior pharynx clear of any exudate or lesions. Normal dentition.  Neck: Muscle spasms present of the posterior neck, no thyromegaly Respiratory: clear to auscultation bilaterally, no wheezing, no crackles. Normal respiratory effort. No accessory muscle use.  Cardiovascular: Regular rate and rhythm, no murmurs / rubs / gallops. No extremity edema. 2+ pedal pulses. No carotid bruits.  Abdomen: no tenderness, no masses palpated. No hepatosplenomegaly. Bowel sounds positive.  Musculoskeletal: no clubbing / cyanosis. No joint deformity upper and lower extremities. Good ROM, no contractures. Normal muscle tone.  Skin:Healing surgical wound of the posterior neck Neurologic: CN 2-12 grossly intact. Sensation intact, DTR normal. Strength 5/5 in all 4.  Psychiatric: Normal judgment and insight. Alert and oriented x 3. Normal mood.     Labs on Admission: I have personally reviewed following labs and imaging studies  CBC:  Recent Labs Lab 12/17/16 2108  WBC 17.8*  NEUTROABS 14.0*  HGB 12.1*  HCT 36.1*  MCV 88.7  PLT 742   Basic Metabolic Panel:  Recent Labs Lab 12/17/16 2108  NA 132*  K 3.4*  CL 99*  CO2 Derek  GLUCOSE 109*  BUN 15  CREATININE 1.00  CALCIUM 9.2   GFR: Estimated Creatinine Clearance: 127 mL/min (by C-G formula based on SCr of 1 mg/dL). Liver Function Tests: No results for input(s): AST, ALT,  ALKPHOS, BILITOT, PROT, ALBUMIN in the last 168 hours. No results for input(s): LIPASE, AMYLASE in the last 168 hours. No results for input(s): AMMONIA in the last 168 hours. Coagulation Profile: No results for input(s): INR, PROTIME in the last 168 hours. Cardiac Enzymes: No results for input(s): CKTOTAL, CKMB, CKMBINDEX, TROPONINI in the last 168 hours. BNP (last 3 results) No results for input(s): PROBNP in the last 8760 hours. HbA1C: No results for input(s): HGBA1C in the last 72 hours. CBG: No results for input(s): GLUCAP in the last 168 hours. Lipid Profile: No results for input(s): CHOL, HDL, LDLCALC, TRIG, CHOLHDL, LDLDIRECT in **Note De-Identified [place] Obfuscation** the last 72 hours. Thyroid Function Tests: No results for input(s): TSH, T4TOTAL, FREET4, T3FREE, THYROIDAB in the last 72 hours. Anemia Panel: No results for input(s): VITAMINB12, FOLATE, FERRITIN, TIBC, IRON, RETICCTPCT in the last 72 hours. Urine analysis:    Component Value Date/Time   COLORURINE YELLOW 07/16/2014 0935   APPEARANCEUR Clear 08/13/2015 0000   LABSPEC 1.033 (H) 07/16/2014 0935   PHURINE 5.5 07/16/2014 0935   GLUCOSEU Negative 08/13/2015 0000   HGBUR NEGATIVE 07/16/2014 0935   BILIRUBINUR Negative 08/13/2015 0000   KETONESUR NEGATIVE 07/16/2014 0935   PROTEINUR Negative 08/13/2015 0000   PROTEINUR NEGATIVE 07/16/2014 0935   UROBILINOGEN 0.2 07/16/2014 0935   NITRITE Negative 08/13/2015 0000   NITRITE NEGATIVE 07/16/2014 0935   LEUKOCYTESUR Negative 08/13/2015 0000   Sepsis Labs: Recent Results (from the past 240 hour(s))  MRSA PCR Screening     Status: None   Collection Time: 07/Derek/18 11:39 PM  Result Value Ref Range Status   MRSA by PCR NEGATIVE NEGATIVE Final    Comment:        The GeneXpert MRSA Assay (FDA approved for NASAL specimens only), is one component of a comprehensive MRSA colonization surveillance program. It is not intended to diagnose MRSA infection nor to guide or monitor treatment for MRSA  infections.      Radiological Exams on Admission: No results found.    Assessment/Plan Possible meningitis after procedure: Acute. Patient presents with worsening headache and neck spasms.  Found to have WBC count of 17.8 with low-grade fever of 100.70F. Given recent surgical procedure there was concern for the possibility of meningitis. Neurosurgery was consulted and recommended obtaining LP prior to antibiotics. Neurosurgery suspects symptoms could be secondary to a chemical meningitis. - Admit to a neuro telemetry bed - Neuro checks - NPO - IR consult ordered for need of LP in a.m, will need to call and make sure order received in a.m. - Pain control - Robaxin prn Neck spasms - Appreciate neurosurgery consultative services, Dr. Arnoldo Morale to follow-up with patient in a.m.  Leukocytosis: WBC elevated at 17.8 on admission. Question if secondary to above. - Recheck CBC in a.m.  H/O Chiari malformation type I: Patient recently underwent suboccipital craniectomy with duraplasty by Dr. Cyndy Freeze on 7/Derek, due to incomplete Chari decompression with AV fistula   Hypokalemia: Acute. Initial potassium 3.4 on admission. - Give 20 mEq of potassium chloride IV 1 dose   Mild intermittent asthma: Stable. - Albuterol inhaler as needed for shortness of breath/wheezing  Hyponatremia: Initial sodium 132.  - Gentle IV fluids of normal saline overnight - Follow-up repeat BMP  DVT prophylaxis: SCDs overnight will need to place on Lovenox when medically stable Code Status: Full  Family Communication: Plan of care was discussed with the patient and family present at bedside Disposition Plan: likely discharge home once medically stable Consults called: neurosurgery  Admission status: Inpatient  Derek Morton MD [facility] Pager [phone]   If 7PM-7AM, please contact night-coverage [email or web address] Password [email or web address]  12/18/2016, 2:48 AM

## 2016-12-19 DIAGNOSIS — M62838 Other muscle spasm: Secondary | ICD-10-CM

## 2016-12-19 LAB — CBC
HCT: 33.5 % — ABNORMAL LOW (ref 39.0–52.0)
Hemoglobin: 11.1 g/dL — ABNORMAL LOW (ref 13.0–17.0)
MCH: 29.6 pg (ref 26.0–34.0)
MCHC: 33.1 g/dL (ref 30.0–36.0)
MCV: 89.3 fL (ref 78.0–100.0)
PLATELETS: 311 10*3/uL (ref 150–400)
RBC: 3.75 MIL/uL — AB (ref 4.22–5.81)
RDW: 13.6 % (ref 11.5–15.5)
WBC: 14.5 10*3/uL — ABNORMAL HIGH (ref 4.0–10.5)

## 2016-12-19 MED ORDER — DEXAMETHASONE SODIUM PHOSPHATE 4 MG/ML IJ SOLN
4.0000 mg | Freq: Four times a day (QID) | INTRAMUSCULAR | Status: DC
  Administered 2016-12-20 – 2016-12-21 (×6): 4 mg via INTRAVENOUS
  Filled 2016-12-19 (×6): qty 1

## 2016-12-19 MED ORDER — KETOROLAC TROMETHAMINE 30 MG/ML IJ SOLN
30.0000 mg | Freq: Four times a day (QID) | INTRAMUSCULAR | Status: DC | PRN
  Administered 2016-12-19 – 2016-12-20 (×2): 30 mg via INTRAVENOUS
  Filled 2016-12-19 (×2): qty 1

## 2016-12-19 MED ORDER — DIAZEPAM 2 MG PO TABS
2.0000 mg | ORAL_TABLET | Freq: Two times a day (BID) | ORAL | Status: DC
  Administered 2016-12-19 – 2016-12-23 (×9): 2 mg via ORAL
  Filled 2016-12-19 (×9): qty 1

## 2016-12-19 MED ORDER — DEXAMETHASONE SODIUM PHOSPHATE 10 MG/ML IJ SOLN
10.0000 mg | Freq: Once | INTRAMUSCULAR | Status: AC
  Administered 2016-12-19: 10 mg via INTRAVENOUS
  Filled 2016-12-19: qty 1

## 2016-12-19 MED ORDER — CYCLOBENZAPRINE HCL 5 MG PO TABS
7.5000 mg | ORAL_TABLET | Freq: Three times a day (TID) | ORAL | Status: DC | PRN
  Administered 2016-12-19: 7.5 mg via ORAL
  Filled 2016-12-19 (×2): qty 2

## 2016-12-19 MED ORDER — OXYCODONE-ACETAMINOPHEN 5-325 MG PO TABS
2.0000 | ORAL_TABLET | Freq: Four times a day (QID) | ORAL | Status: DC | PRN
  Administered 2016-12-19: 2 via ORAL
  Filled 2016-12-19: qty 2

## 2016-12-19 MED ORDER — DEXTROSE 5 % IV SOLN
2.0000 g | Freq: Three times a day (TID) | INTRAVENOUS | Status: DC
  Administered 2016-12-19 – 2016-12-22 (×10): 2 g via INTRAVENOUS
  Filled 2016-12-19 (×11): qty 2

## 2016-12-19 NOTE — Progress Notes (Signed)
**Note De-Identified [place] Obfuscation** Asked for steroid recommendations Rec Decadron 10 now and then decadron 4q6. Orders entered.

## 2016-12-19 NOTE — Progress Notes (Signed)
**Note De-Identified [place] Obfuscation** Interim Note - LP was performed under fluroscopy yesterday- shows significant WBC, mainly neutrophils and elevated protein. Called Dr.  Burnis Medin this morning and recommended he get ID on board regarding decision on antibiotics. Neurosurgery on board.  We have nothing further to add to this patient's care and will sign off. Thanks for the consult.

## 2016-12-19 NOTE — Progress Notes (Signed)
**Note De-Identified [place] Obfuscation** Patient was evaluated by me for sever headache and spasm . I discussed starting IV steroids with the ID attending  Dr.Snider and Neurology Dr.Aroor who deferred to the Nsx attending , I paged the Nsx , their PA responded to me and,  I asked to discuss the case with his Attending, the PA mentioned to start the steroids regardless , I asked for the Neurosurgery  Attending input for such acquired meningitis  but the PA refused to help me to get in touch with the on call neurosuregry attending .

## 2016-12-19 NOTE — Progress Notes (Signed)
**Note De-Identified [name] Obfuscation** Patient given multiple pain medications for neck/head spasms, Dilaudid 1mg  IV @ 1339, Valium 2mg  PO @ 1351, Ativan 0.5mg  @ 1422, notified MD at 1532 that patient continued to c/o of headache and small spasms, Flexeril  7.5mg  1548. Vital signs taken per MD order B/P 148/80, P 84, R 20, T 101 @ 1600 and patient than given ketorolac 30mg  IV for fever per MD instructions, will recheck vital signs in one hour.

## 2016-12-19 NOTE — Progress Notes (Signed)
**Note De-Identified [place] Obfuscation** PROGRESS NOTE    Derek Donaldson  YPP:[id number] DOB: 02-27-1971 DOA: 12/17/2016 PCP: Dione Housekeeper, MD    Brief Narrative: Derek Donaldson is a 46 y.o. male with medical history significant of HTN, asthma, and recent suboccipital craniectomy with duraplasty by Dr. Cyndy Freeze on 7/26, due to incomplete [place] decompression that had previously treated with AV fistula. Patient was discharged home on 12/11/16 presents with worsening chills back pain neck pain and spasms with low-grade fevers.  Assessment & Plan:    *meningitis after procedure:   -Possible hospital acquired , CSF is supportive ,continue IV Abx , pending final Cx .   *H/O Chiari malformation type I: Patient recently underwent suboccipital craniectomy with duraplasty by Dr. Cyndy Freeze on 7/26, due to incomplete Chari decompression with AV fistula    *Mild intermittent asthma: Stable. - Albuterol inhaler as needed for shortness of breath/wheezing.      DVT prophylaxis: (SCD') Code Status:(Full ) Family Communication: ("discussed with patient") Disposition Plan: (HOME)   Consultants:     Procedures:    Antimicrobials: (specify start and planned stop date. Auto populated tables are space occupying and do not give end dates)  NONE      Subjective:  Still with neck spasm , no fevers no chills , no headache .  Objective: Vitals:   12/18/16 1412 12/18/16 1618 12/18/16 2123 12/19/16 0554  BP: (!) 143/79 140/83 139/83 (!) 154/90  Pulse: 80 79 84 83  Resp: 18 18 18 16   Temp: 97.8 F (36.6 C) 98.1 F (36.7 C) 98.8 F (37.1 C) 98 F (36.7 C)  TempSrc: Oral Oral Oral Oral  SpO2: 97% 96% 96% 96%  Weight:      Height:        Intake/Output Summary (Last 24 hours) at 12/19/16 1301 Last data filed at 12/19/16 0603  Gross per 24 hour  Intake           2527.5 ml  Output              400 ml  Net           2127.5 ml   Filed Weights   12/17/16 1926 12/18/16 0544  Weight: 133.8 kg (295 lb) 132.2 kg (291 lb 8 oz)     Examination:  General exam: NAD. Respiratory system: Clear to auscultation. Respiratory effort normal. Cardiovascular system: RRR,S1S2. Gastrointestinal system: Abdomen is nondistended, soft and nontender. No organomegaly or masses felt. Normal bowel sounds heard. Central nervous system: Alert and oriented. No focal neurological deficitS , MENINGEAL SIGNS are negative. Extremities: Symmetric 5 x 5 power. Skin: No rashes, lesions or ulcers Psychiatry: Judgement and insight appear normal. Mood & affect appropriate.     Data Reviewed: I have personally reviewed following labs and imaging studies  CBC:  Recent Labs Lab 12/17/16 2108 12/18/16 0414  WBC 17.8* 18.8*  NEUTROABS 14.0* 17.6*  HGB 12.1* 11.6*  HCT 36.1* 34.2*  MCV 88.7 87.9  PLT 365 458   Basic Metabolic Panel:  Recent Labs Lab 12/17/16 2108 12/18/16 0414  NA 132* 131*  K 3.4* 4.0  CL 99* 102  CO2 26 21*  GLUCOSE 109* 156*  BUN 15 13  CREATININE 1.00 0.85  CALCIUM 9.2 8.8*   GFR: Estimated Creatinine Clearance: 148.5 mL/min (by C-G formula based on SCr of 0.85 mg/dL). Liver Function Tests: No results for input(s): AST, ALT, ALKPHOS, BILITOT, PROT, ALBUMIN in the last 168 hours. No results for input(s): LIPASE, AMYLASE in the last 168 hours. No **Note De-Identified [place] Obfuscation** results for input(s): AMMONIA in the last 168 hours. Coagulation Profile:  Recent Labs Lab 12/18/16 1758  INR 1.05   Cardiac Enzymes: No results for input(s): CKTOTAL, CKMB, CKMBINDEX, TROPONINI in the last 168 hours. BNP (last 3 results) No results for input(s): PROBNP in the last 8760 hours. HbA1C: No results for input(s): HGBA1C in the last 72 hours. CBG: No results for input(s): GLUCAP in the last 168 hours. Lipid Profile: No results for input(s): CHOL, HDL, LDLCALC, TRIG, CHOLHDL, LDLDIRECT in the last 72 hours. Thyroid Function Tests: No results for input(s): TSH, T4TOTAL, FREET4, T3FREE, THYROIDAB in the last 72 hours. Anemia Panel: No  results for input(s): VITAMINB12, FOLATE, FERRITIN, TIBC, IRON, RETICCTPCT in the last 72 hours. Urine analysis:    Component Value Date/Time   COLORURINE YELLOW 12/18/2016 0214   APPEARANCEUR CLOUDY (A) 12/18/2016 0214   APPEARANCEUR Clear 08/13/2015 0000   LABSPEC 1.026 12/18/2016 0214   PHURINE 7.0 12/18/2016 0214   GLUCOSEU NEGATIVE 12/18/2016 0214   HGBUR NEGATIVE 12/18/2016 0214   BILIRUBINUR NEGATIVE 12/18/2016 0214   BILIRUBINUR Negative 08/13/2015 0000   KETONESUR NEGATIVE 12/18/2016 0214   PROTEINUR 30 (A) 12/18/2016 0214   UROBILINOGEN 0.2 07/16/2014 0935   NITRITE NEGATIVE 12/18/2016 0214   LEUKOCYTESUR TRACE (A) 12/18/2016 0214   LEUKOCYTESUR Negative 08/13/2015 0000   Sepsis Labs: @LABRCNTIP (procalcitonin:4,lacticidven:4)  ) Recent Results (from the past 240 hour(s))  MRSA PCR Screening     Status: None   Collection Time: 12/09/16 11:39 PM  Result Value Ref Range Status   MRSA by PCR NEGATIVE NEGATIVE Final    Comment:        The GeneXpert MRSA Assay (FDA approved for NASAL specimens only), is one component of a comprehensive MRSA colonization surveillance program. It is not intended to diagnose MRSA infection nor to guide or monitor treatment for MRSA infections.   Blood culture (routine x 2)     Status: None (Preliminary result)   Collection Time: 12/18/16  1:50 AM  Result Value Ref Range Status   Specimen Description BLOOD RIGHT HAND  Final   Special Requests   Final    BOTTLES DRAWN AEROBIC AND ANAEROBIC Blood Culture adequate volume   Culture NO GROWTH 1 DAY  Final   Report Status PENDING  Incomplete  Blood culture (routine x 2)     Status: None (Preliminary result)   Collection Time: 12/18/16  2:00 AM  Result Value Ref Range Status   Specimen Description BLOOD LEFT HAND  Final   Special Requests   Final    BOTTLES DRAWN AEROBIC AND ANAEROBIC Blood Culture adequate volume   Culture NO GROWTH 1 DAY  Final   Report Status PENDING  Incomplete   Urine Culture     Status: Abnormal (Preliminary result)   Collection Time: 12/18/16  2:12 AM  Result Value Ref Range Status   Specimen Description URINE, RANDOM  Final   Special Requests NONE  Final   Culture >=100,000 COLONIES/mL UNIDENTIFIED ORGANISM (A)  Final   Report Status PENDING  Incomplete  Anaerobic culture     Status: None (Preliminary result)   Collection Time: 12/18/16  5:18 PM  Result Value Ref Range Status   Specimen Description CSF TUBE 2  Final   Special Requests NONE  Final   Culture   Final    NO ANAEROBES ISOLATED; CULTURE IN PROGRESS FOR 5 DAYS   Report Status PENDING  Incomplete  CSF culture     Status: None (Preliminary result) **Note De-Identified [place] Obfuscation** Collection Time: 12/18/16  5:18 PM  Result Value Ref Range Status   Specimen Description CSF TUBE 2  Final   Special Requests NONE  Final   Gram Stain   Final    ABUNDANT WBC PRESENT, PREDOMINANTLY PMN NO ORGANISMS SEEN    Culture NO GROWTH 1 DAY  Final   Report Status PENDING  Incomplete  Culture, fungus without smear     Status: None (Preliminary result)   Collection Time: 12/18/16  5:18 PM  Result Value Ref Range Status   Specimen Description CSF TUBE 2  Final   Special Requests NONE  Final   Culture NO GROWTH < 24 HOURS  Final   Report Status PENDING  Incomplete         Radiology Studies: Dg Chest 2 View  Result Date: 12/18/2016 CLINICAL DATA:  46 year old male with shortness of breath EXAM: CHEST  2 VIEW COMPARISON:  Prior chest x-ray 10/20/2015 FINDINGS: Cardiac and mediastinal contours are within normal limits. Mild streaky airspace opacities in both lower lobes. Trace bronchitic changes are similar compared to prior. No pneumothorax, pleural effusion or focal airspace consolidation. No acute osseous abnormality. IMPRESSION: Mild streaky airspace opacities in both lung bases favored to reflect subsegmental atelectasis. Otherwise, no acute cardiopulmonary process. Electronically Signed   By: Jacqulynn Cadet M.D.   On:  12/18/2016 09:13   Ct Head Wo Contrast  Result Date: 12/17/2016 CLINICAL DATA:  Initial evaluation for acute severe headache, worse headache of life. EXAM: CT HEAD WITHOUT CONTRAST TECHNIQUE: Contiguous axial images were obtained from the base of the skull through the vertex without intravenous contrast. COMPARISON:  Prior MRI from 11/29/2016. 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 Additionally, patient is status post suboccipital decompressive craniectomy for decompression of Chiari 1 malformation. Evaluation of the basilar cisterns and posterior fossa as well as the left cerebral hemisphere limited by streak artifact. No evidence for acute intracranial hemorrhage. No acute large vessel territory infarct. No mass lesion, midline shift, or mass effect. No hydrocephalus. No definite extra-axial fluid collection. Vascular: Intracranial vasculature poorly evaluated due to extensive streak artifact. Skull: Embolization material present at the age left temporal and occipital calvarium, extending into the extracranial soft tissues. Skin staple in place at the right frontotemporal scalp. Small collection measuring approximately 5.0 x 3.3 cm present at the Chiari 1 decompression site (series 3, image 8), most likely postoperative seroma. Sinuses/Orbits: Globes and orbital soft tissues within normal limits. Mucosal thickening present within the inferior maxillary sinuses. Paranasal sinuses are otherwise clear. Mastoid air cells grossly clear. IMPRESSION: 1. 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 2. No other definite acute intracranial process identified. Electronically Signed   By: Jeannine Boga M.D.   On: 12/17/2016 21:50   Dg Fluoro Guide Lumbar Puncture  Addendum Date: 12/18/2016   ADDENDUM REPORT: 12/18/2016 18:13  ADDENDUM: Laboratory staff confirms that 10 cc of CSF was delivered. Electronically Signed   By: Franki Cabot M.D.   On: 12/18/2016 18:13   Result Date: 12/18/2016 CLINICAL DATA:  Meningitis after procedure. EXAM: DIAGNOSTIC LUMBAR PUNCTURE UNDER FLUOROSCOPIC GUIDANCE FLUOROSCOPY TIME:  Fluoroscopy Time:  24 seconds Number of Acquired Spot Images: 1 PROCEDURE: Informed consent was obtained from the patient prior to the procedure, including potential complications of headache, allergy, and pain. With the patient prone, the lower back was prepped with Betadine. 1% Lidocaine **Note De-Identified [place] Obfuscation** was used for local anesthesia. Lumbar puncture was performed at the L4-5 level using a 20 gauge needle with return of cloudy CSF. Opening pressure was not obtained due to inability to moved the patient to a lateral decubitus positioning. 11 ml of CSF were obtained for laboratory studies. The patient tolerated the procedure well and there were no apparent complications. IMPRESSION: Successful fluoroscopic guided lumbar puncture with 11 cc of cloudy CSF removed and sent to the laboratory. Electronically Signed: By: Franki Cabot M.D. On: 12/18/2016 17:54        Scheduled Meds: . diazepam  2 mg Oral BID   Continuous Infusions: . sodium chloride 75 mL/hr at 12/18/16 1623  . ampicillin (OMNIPEN) IV Stopped (12/19/16 1218)  . ceFEPime (MAXIPIME) IV 2 g (12/19/16 1118)  . vancomycin Stopped (12/19/16 0942)     LOS: 1 day    Time spent: 35 minutes .    Waldron Session, MD [facility]   If 7PM-7AM, please contact night-coverage [email or web address] Password [email or web address] 12/19/2016, 1:01 PM

## 2016-12-19 NOTE — Progress Notes (Signed)
**Note De-Identified [place] Obfuscation** Patient ID: Derek Donaldson, male   DOB: 02-Jun-1970, 46 y.o.   MRN: [id number] Subjective:  The patient is alert and pleasant. He looks and feels better today. His wife is at the bedside.  Objective: Vital signs in last 24 hours: Temp:  [97.6 F (36.4 C)-98.8 F (37.1 C)] 98 F (36.7 C) (08/05 0554) Pulse Rate:  [78-84] 83 (08/05 0554) Resp:  [16-18] 16 (08/05 0554) BP: (139-154)/(79-90) 154/90 (08/05 0554) SpO2:  [96 %-98 %] 96 % (08/05 0554)  Intake/Output from previous day: 08/04 0701 - 08/05 0700 In: 2527.5 [I.V.:1967.5; IV Piggyback:560] Out: 400 [Urine:400] Intake/Output this shift: No intake/output data recorded.  Physical exam the patient is alert and pleasant. He is moving his lower extremities well. He is in no apparent distress.  Lab Results:  Recent Labs  12/17/16 2108 12/18/16 0414  WBC 17.8* 18.8*  HGB 12.1* 11.6*  HCT 36.1* 34.2*  PLT 365 331   BMET  Recent Labs  12/17/16 2108 12/18/16 0414  NA 132* 131*  K 3.4* 4.0  CL 99* 102  CO2 26 21*  GLUCOSE 109* 156*  BUN 15 13  CREATININE 1.00 0.85  CALCIUM 9.2 8.8*    Studies/Results: Dg Chest 2 View  Result Date: 12/18/2016 CLINICAL DATA:  46 year old male with shortness of breath EXAM: CHEST  2 VIEW COMPARISON:  Prior chest x-ray 10/20/2015 FINDINGS: Cardiac and mediastinal contours are within normal limits. Mild streaky airspace opacities in both lower lobes. Trace bronchitic changes are similar compared to prior. No pneumothorax, pleural effusion or focal airspace consolidation. No acute osseous abnormality. IMPRESSION: Mild streaky airspace opacities in both lung bases favored to reflect subsegmental atelectasis. Otherwise, no acute cardiopulmonary process. Electronically Signed   By: Jacqulynn Cadet M.D.   On: 12/18/2016 09:13   Ct Head Wo Contrast  Result Date: 12/17/2016 CLINICAL DATA:  Initial evaluation for acute severe headache, worse headache of life. EXAM: CT HEAD WITHOUT CONTRAST TECHNIQUE:  Contiguous axial images were obtained from the base of the skull through the vertex without intravenous contrast. COMPARISON:  Prior MRI from 11/29/2016. 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 Additionally, patient is status post suboccipital decompressive craniectomy for decompression of Chiari 1 malformation. Evaluation of the basilar cisterns and posterior fossa as well as the left cerebral hemisphere limited by streak artifact. No evidence for acute intracranial hemorrhage. No acute large vessel territory infarct. No mass lesion, midline shift, or mass effect. No hydrocephalus. No definite extra-axial fluid collection. Vascular: Intracranial vasculature poorly evaluated due to extensive streak artifact. Skull: Embolization material present at the age left temporal and occipital calvarium, extending into the extracranial soft tissues. Skin staple in place at the right frontotemporal scalp. Small collection measuring approximately 5.0 x 3.3 cm present at the Chiari 1 decompression site (series 3, image 8), most likely postoperative seroma. Sinuses/Orbits: Globes and orbital soft tissues within normal limits. Mucosal thickening present within the inferior maxillary sinuses. Paranasal sinuses are otherwise clear. Mastoid air cells grossly clear. IMPRESSION: 1. 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 2. No other definite acute intracranial process identified. Electronically Signed   By: Jeannine Boga M.D.   On: 12/17/2016 21:50   Dg Fluoro Guide Lumbar Puncture  Addendum Date: 12/18/2016   ADDENDUM REPORT: 12/18/2016 18:13 ADDENDUM: Laboratory staff confirms that 10 cc of CSF was delivered. Electronically Signed   By: Cherlynn Kaiser **Note De-Identified [place] Obfuscation** Enriqueta Shutter M.D.   On: 12/18/2016 18:13   Result Date: 12/18/2016 CLINICAL DATA:  Meningitis after  procedure. EXAM: DIAGNOSTIC LUMBAR PUNCTURE UNDER FLUOROSCOPIC GUIDANCE FLUOROSCOPY TIME:  Fluoroscopy Time:  24 seconds Number of Acquired Spot Images: 1 PROCEDURE: Informed consent was obtained from the patient prior to the procedure, including potential complications of headache, allergy, and pain. With the patient prone, the lower back was prepped with Betadine. 1% Lidocaine was used for local anesthesia. Lumbar puncture was performed at the L4-5 level using a 20 gauge needle with return of cloudy CSF. Opening pressure was not obtained due to inability to moved the patient to a lateral decubitus positioning. 11 ml of CSF were obtained for laboratory studies. The patient tolerated the procedure well and there were no apparent complications. IMPRESSION: Successful fluoroscopic guided lumbar puncture with 11 cc of cloudy CSF removed and sent to the laboratory. Electronically Signed: By: Franki Cabot M.D. On: 12/18/2016 17:54    Assessment/Plan: Meningitis: The patient has quite a few white cells in the spinal fluid. We will will await the cultures to determine whether this is a aseptic or septic meningitis. In the meantime we continue with antibiotics. We will ask infectious disease to see the patient tomorrow.  LOS: 1 day     Brynnlie Unterreiner D 12/19/2016, 9:20 AM

## 2016-12-19 NOTE — Consult Note (Signed)
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 He was admitted on 7/26 for decompression [place] C1 laminectomy, suboccipital craniotomy with duraplasty with dural patch graft and dural sealant. He was discharged on 7/28, with muscle relaxants and pain meds to use PRN. He started to have increasing neck spasms which caused him to have HA, in addition to chills roughly POD#6. Symptoms did not improve with muscle relaxants and pain meds. In the ED, He was found to have fever of 100.8, neck stiffness and admitted on 8/3 for concern of post-op related meningitis, (aseptic vs. Bacterial.) he underwent LP which showed 9,800 cells with 94% neutrophils, protein 138. Glu 44. He was empirically started on vanco, amp, ceftriaxone, plus 1 dose of decadron. He was diffusely diaphoretic on the first day for which has improved. Headache/muscle spasms occur when he has gone from laying to sitting up, either for feeding or going to bathroom. They are severe in nature, but denies photophobia or n/v. Has decrease range of motion in all directions for his neck. No drainage from the 2 staples on scalp near right ear.  Past Medical History:  Diagnosis Date  . Asthma    Albuterol inhaler prn;just a small touch  . Cancer ([place]) 2010   skin ( unsure of the pathology)- removed  . Chiari malformation   . Essential hypertension, benign    takes  Metoprolol,Diovan,and Amlodipine daily  . Headache    migraines in the past  . History of bronchitis   . History of kidney stones    passed on his own  . Mixed hyperlipidemia    takes Fenofibrate daily  . Muscle spasm    takes Flexeril daily as needed  . Numbness    left arm from chiari malformation  . Pneumonia 2005   hx of  . Shortness of breath    with exertion    Allergies:  Allergies  Allergen Reactions  . Contrast Media [Iodinated Diagnostic Agents] Hives and Swelling    Lip swelling - reaction after contrast dye given in the morning and shrimp eaten at lunch - unable to determine which caused the reaction - 2015 - pt has had contrast dye since then with pre-medication  . Shellfish Allergy Hives and Swelling    Lip swelling - reaction after contrast dye given in the morning and shrimp eaten at lunch - unable to determine which caused the reaction - 2015     Social History  Substance Use Topics  . Smoking status: Never Smoker  . Smokeless tobacco: Never Used  . Alcohol use No    Family History  Problem Relation Age of Onset  . Aneurysm Father   . Asthma Mother   . Asthma Brother   . Heart failure Maternal Grandmother   . Cancer Maternal Grandfather **Note De-Identified [place] Obfuscation** unknown type  . Stroke Paternal Grandfather    Review of Systems  Constitutional: +for fever, chills, diaphoresis, activity change, appetite change, fatigue and unexpected weight change.  HENT: Negative for congestion, sore throat, rhinorrhea, sneezing, trouble swallowing and sinus pressure.  Eyes: Negative for photophobia and visual disturbance.  Respiratory: Negative for cough, chest tightness, shortness of breath, wheezing and stridor.  Cardiovascular: Negative for chest pain, palpitations and leg swelling.  Gastrointestinal: Negative for nausea, vomiting, abdominal pain, diarrhea, constipation, blood in stool, abdominal distention and anal bleeding.  Genitourinary: Negative for dysuria, hematuria, flank  pain and difficulty urinating.  Musculoskeletal: + neck pain/spasm Skin: Negative for color change, pallor, rash and wound.  Neurological: + headache  Hematological: Negative for adenopathy. Does not bruise/bleed easily.  Psychiatric/Behavioral: Negative for behavioral problems, confusion, sleep disturbance, dysphoric mood, decreased concentration and agitation.     OBJECTIVE: Temp:  [97.6 F (36.4 C)-98.8 F (37.1 C)] 98 F (36.7 C) (08/05 0554) Pulse Rate:  [78-84] 83 (08/05 0554) Resp:  [16-18] 16 (08/05 0554) BP: (139-154)/(79-90) 154/90 (08/05 0554) SpO2:  [96 %-98 %] 96 % (08/05 0554) Physical Exam  Constitutional: He is oriented to person, place, and time. He appears well-developed and well-nourished. In significant distress. Has hand over this head Scalp: staples are c/d/i, no surrounding erythema or drainage noted in hairline  HENT:  Mouth/Throat: Oropharynx is clear and moist. No oropharyngeal exudate.  Cardiovascular: Normal rate, regular rhythm and normal heart sounds. Exam reveals no gallop and no friction rub.  No murmur heard.  Pulmonary/Chest: Effort normal and breath sounds normal. No respiratory distress. He has no wheezes.  Abdominal: Soft. Bowel sounds are normal. He exhibits no distension. There is no tenderness.  Neck: guarded and did not examine Neurological: He is alert and oriented to person, place, and time.  Skin: Skin is warm and dry. No rash noted. No erythema.  Psychiatric: He has a normal mood and affect. His behavior is normal.     LABS: Results for orders placed or performed during the hospital encounter of 12/17/16 (from the past 48 hour(s))  CBC with Differential     Status: Abnormal   Collection Time: 12/17/16  9:08 PM  Result Value Ref Range   WBC 17.8 (H) 4.0 - 10.5 K/uL   RBC 4.07 (L) 4.22 - 5.81 MIL/uL   Hemoglobin 12.1 (L) 13.0 - 17.0 g/dL   HCT 36.1 (L) 39.0 - 52.0 %   MCV 88.7 78.0 - 100.0 fL   MCH 29.7 26.0 - 34.0 pg   MCHC 33.5  30.0 - 36.0 g/dL   RDW 13.5 11.5 - 15.5 %   Platelets 365 150 - 400 K/uL   Neutrophils Relative % 79 %   Neutro Abs 14.0 (H) 1.7 - 7.7 K/uL   Lymphocytes Relative 13 %   Lymphs Abs 2.4 0.7 - 4.0 K/uL   Monocytes Relative 7 %   Monocytes Absolute 1.3 (H) 0.1 - 1.0 K/uL   Eosinophils Relative 1 %   Eosinophils Absolute 0.2 0.0 - 0.7 K/uL   Basophils Relative 0 %   Basophils Absolute 0.0 0.0 - 0.1 K/uL  Basic metabolic panel     Status: Abnormal   Collection Time: 12/17/16  9:08 PM  Result Value Ref Range   Sodium 132 (L) 135 - 145 mmol/L   Potassium 3.4 (L) 3.5 - 5.1 mmol/L   Chloride 99 (L) 101 - 111 mmol/L   CO2 26 22 - 32 mmol/L   Glucose, Bld 109 (H) **Note De-Identified [place] Obfuscation** 65 - 99 mg/dL   BUN 15 6 - 20 mg/dL   Creatinine, Ser 1.00 0.61 - 1.24 mg/dL   Calcium 9.2 8.9 - 10.3 mg/dL   GFR calc non Af Amer >60 >60 mL/min   GFR calc Af Amer >60 >60 mL/min    Comment: (NOTE) The eGFR has been calculated using the CKD EPI equation. This calculation has not been validated in all clinical situations. eGFR's persistently <60 mL/min signify possible Chronic Kidney Disease.    Anion gap 7 5 - 15  I-Stat CG4 Lactic Acid, ED     Status: None   Collection Time: 12/18/16  1:47 AM  Result Value Ref Range   Lactic Acid, Venous 0.96 0.5 - 1.9 mmol/L  Urinalysis, Routine w reflex microscopic     Status: Abnormal   Collection Time: 12/18/16  2:14 AM  Result Value Ref Range   Color, Urine YELLOW YELLOW   APPearance CLOUDY (A) CLEAR   Specific Gravity, Urine 1.026 1.005 - 1.030   pH 7.0 5.0 - 8.0   Glucose, UA NEGATIVE NEGATIVE mg/dL   Hgb urine dipstick NEGATIVE NEGATIVE   Bilirubin Urine NEGATIVE NEGATIVE   Ketones, ur NEGATIVE NEGATIVE mg/dL   Protein, ur 30 (A) NEGATIVE mg/dL   Nitrite NEGATIVE NEGATIVE   Leukocytes, UA TRACE (A) NEGATIVE   RBC / HPF 0-5 0 - 5 RBC/hpf   WBC, UA 6-30 0 - 5 WBC/hpf   Bacteria, UA NONE SEEN NONE SEEN   Squamous Epithelial / LPF 0-5 (A) NONE SEEN   Mucous PRESENT   CBC  with Differential/Platelet     Status: Abnormal   Collection Time: 12/18/16  4:14 AM  Result Value Ref Range   WBC 18.8 (H) 4.0 - 10.5 K/uL   RBC 3.89 (L) 4.22 - 5.81 MIL/uL   Hemoglobin 11.6 (L) 13.0 - 17.0 g/dL   HCT 34.2 (L) 39.0 - 52.0 %   MCV 87.9 78.0 - 100.0 fL   MCH 29.8 26.0 - 34.0 pg   MCHC 33.9 30.0 - 36.0 g/dL   RDW 13.4 11.5 - 15.5 %   Platelets 331 150 - 400 K/uL   Neutrophils Relative % 93 %   Neutro Abs 17.6 (H) 1.7 - 7.7 K/uL   Lymphocytes Relative 5 %   Lymphs Abs 0.8 0.7 - 4.0 K/uL   Monocytes Relative 2 %   Monocytes Absolute 0.4 0.1 - 1.0 K/uL   Eosinophils Relative 0 %   Eosinophils Absolute 0.0 0.0 - 0.7 K/uL   Basophils Relative 0 %   Basophils Absolute 0.0 0.0 - 0.1 K/uL  Basic metabolic panel     Status: Abnormal   Collection Time: 12/18/16  4:14 AM  Result Value Ref Range   Sodium 131 (L) 135 - 145 mmol/L   Potassium 4.0 3.5 - 5.1 mmol/L   Chloride 102 101 - 111 mmol/L   CO2 21 (L) 22 - 32 mmol/L   Glucose, Bld 156 (H) 65 - 99 mg/dL   BUN 13 6 - 20 mg/dL   Creatinine, Ser 0.85 0.61 - 1.24 mg/dL   Calcium 8.8 (L) 8.9 - 10.3 mg/dL   GFR calc non Af Amer >60 >60 mL/min   GFR calc Af Amer >60 >60 mL/min    Comment: (NOTE) The eGFR has been calculated using the CKD EPI equation. This calculation has not been validated in all clinical situations. eGFR's persistently <60 mL/min signify possible Chronic Kidney Disease.    Anion gap 8 5 - 15 **Note De-Identified [place] Obfuscation** Glucose, CSF     Status: None   Collection Time: 12/18/16  5:18 PM  Result Value Ref Range   Glucose, CSF 44 40 - 70 mg/dL  Protein, CSF     Status: Abnormal   Collection Time: 12/18/16  5:18 PM  Result Value Ref Range   Total  Protein, CSF 138 (H) 15 - 45 mg/dL    Comment: RESULTS CONFIRMED BY MANUAL DILUTION  CSF cell count with differential     Status: Abnormal   Collection Time: 12/18/16  5:18 PM  Result Value Ref Range   Tube # 3    Color, CSF PINK (A) COLORLESS   Appearance, CSF CLOUDY (A) CLEAR    Supernatant NOT INDICATED    RBC Count, CSF 2,025 (H) 0 /cu mm   WBC, CSF 9,800 (HH) 0 - 5 /cu mm    Comment: CRITICAL RESULT CALLED TO, READ BACK BY AND VERIFIED WITH: MARIE TROGDON RN 0800 12/18/2016 BY MACEDA, J    Segmented Neutrophils-CSF 94 (H) 0 - 6 %   Lymphs, CSF 2 (L) 40 - 80 %   Monocyte-Macrophage-Spinal Fluid 4 (L) 15 - 45 %  Anaerobic culture     Status: None (Preliminary result)   Collection Time: 12/18/16  5:18 PM  Result Value Ref Range   Specimen Description CSF TUBE 2    Special Requests NONE    Culture      NO ANAEROBES ISOLATED; CULTURE IN PROGRESS FOR 5 DAYS   Report Status PENDING   CSF culture     Status: None (Preliminary result)   Collection Time: 12/18/16  5:18 PM  Result Value Ref Range   Specimen Description CSF TUBE 2    Special Requests NONE    Gram Stain      ABUNDANT WBC PRESENT, PREDOMINANTLY PMN NO ORGANISMS SEEN    Culture NO GROWTH 1 DAY    Report Status PENDING   APTT     Status: None   Collection Time: 12/18/16  5:58 PM  Result Value Ref Range   aPTT 29 24 - 36 seconds  Protime-INR     Status: None   Collection Time: 12/18/16  5:58 PM  Result Value Ref Range   Prothrombin Time 13.8 11.4 - 15.2 seconds   INR 1.05     MICRO: 8/4 blood cx ngtd 8/4 csf gram stain no organism, ngtd IMAGING: Dg Chest 2 View  Result Date: 12/18/2016 CLINICAL DATA:  46 year old male with shortness of breath EXAM: CHEST  2 VIEW COMPARISON:  Prior chest x-ray 10/20/2015 FINDINGS: Cardiac and mediastinal contours are within normal limits. Mild streaky airspace opacities in both lower lobes. Trace bronchitic changes are similar compared to prior. No pneumothorax, pleural effusion or focal airspace consolidation. No acute osseous abnormality. IMPRESSION: Mild streaky airspace opacities in both lung bases favored to reflect subsegmental atelectasis. Otherwise, no acute cardiopulmonary process. Electronically Signed   By: Jacqulynn Cadet M.D.   On: 12/18/2016  09:13   Ct Head Wo Contrast  Result Date: 12/17/2016 CLINICAL DATA:  Initial evaluation for acute severe headache, worse headache of life. EXAM: CT HEAD WITHOUT CONTRAST TECHNIQUE: Contiguous axial images were obtained from the base of the skull through the vertex without intravenous contrast. COMPARISON:  Prior MRI from 11/29/2016. 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 Additionally, patient is status post suboccipital decompressive craniectomy for decompression of Chiari 1 malformation. Evaluation of the basilar cisterns and posterior fossa as **Note De-Identified Kenan Obfuscation** well as the left cerebral hemisphere limited by streak artifact. No evidence for acute intracranial hemorrhage. No acute large vessel territory infarct. No mass lesion, midline shift, or mass effect. No hydrocephalus. No definite extra-axial fluid collection. Vascular: Intracranial vasculature poorly evaluated due to extensive streak artifact. Skull: Embolization material present at the age left temporal and occipital calvarium, extending into the extracranial soft tissues. Skin staple in place at the right frontotemporal scalp. Small collection measuring approximately 5.0 x 3.3 cm present at the Chiari 1 decompression site (series 3, image 8), most likely postoperative seroma. Sinuses/Orbits: Globes and orbital soft tissues within normal limits. Mucosal thickening present within the inferior maxillary sinuses. Paranasal sinuses are otherwise clear. Mastoid air cells grossly clear. IMPRESSION: 1. 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 2. No other definite acute intracranial process identified. Electronically Signed   By: Jeannine Boga M.D.   On: 12/17/2016 21:50   Dg Fluoro Guide Lumbar Puncture  Addendum Date: 12/18/2016   ADDENDUM REPORT: 12/18/2016 18:13 ADDENDUM:  Laboratory staff confirms that 10 cc of CSF was delivered. Electronically Signed   By: Franki Cabot M.D.   On: 12/18/2016 18:13   Result Date: 12/18/2016 CLINICAL DATA:  Meningitis after procedure. EXAM: DIAGNOSTIC LUMBAR PUNCTURE UNDER FLUOROSCOPIC GUIDANCE FLUOROSCOPY TIME:  Fluoroscopy Time:  24 seconds Number of Acquired Spot Images: 1 PROCEDURE: Informed consent was obtained from the patient prior to the procedure, including potential complications of headache, allergy, and pain. With the patient prone, the lower back was prepped with Betadine. 1% Lidocaine was used for local anesthesia. Lumbar puncture was performed at the L4-5 level using a 20 gauge needle with return of cloudy CSF. Opening pressure was not obtained due to inability to moved the patient to a lateral decubitus positioning. 11 ml of CSF were obtained for laboratory studies. The patient tolerated the procedure well and there were no apparent complications. IMPRESSION: Successful fluoroscopic guided lumbar puncture with 11 cc of cloudy CSF removed and sent to the laboratory. Electronically Signed: By: Franki Cabot M.D. On: 12/18/2016 17:54   Assessment/Plan: 46 yo M who is s/p decompressive suboccipital craniotomy with duraplasty admitted for neck spasm/pain, headache, fevers found to have neutrophilic predominance pleocytosis on CSF profile concerning for post-op meningitis - aseptic (chemical) vs. Bacterial  - for now keep on cefepime plus vancomycin. Unlikely to be listeria, can d/c amp, and unlikely to be community acquired strep pneumo, can d/c ceftriaxone. 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 Consider short course of high dose steroids with decadron - defer to neurosurgery to see if they agree.

## 2016-12-20 DIAGNOSIS — Z79899 Other long term (current) drug therapy: Secondary | ICD-10-CM

## 2016-12-20 DIAGNOSIS — G030 Nonpyogenic meningitis: Secondary | ICD-10-CM

## 2016-12-20 LAB — COMPREHENSIVE METABOLIC PANEL
ALT: 36 U/L (ref 17–63)
ANION GAP: 10 (ref 5–15)
AST: 13 U/L — ABNORMAL LOW (ref 15–41)
Albumin: 3.1 g/dL — ABNORMAL LOW (ref 3.5–5.0)
Alkaline Phosphatase: 34 U/L — ABNORMAL LOW (ref 38–126)
BILIRUBIN TOTAL: 0.8 mg/dL (ref 0.3–1.2)
BUN: 11 mg/dL (ref 6–20)
CHLORIDE: 106 mmol/L (ref 101–111)
CO2: 22 mmol/L (ref 22–32)
Calcium: 8.8 mg/dL — ABNORMAL LOW (ref 8.9–10.3)
Creatinine, Ser: 0.66 mg/dL (ref 0.61–1.24)
GFR calc Af Amer: >60 mL/min (ref >60)
Glucose, Bld: 138 mg/dL — ABNORMAL HIGH (ref 65–99)
POTASSIUM: 3.7 mmol/L (ref 3.5–5.1)
Sodium: 138 mmol/L (ref 135–145)
TOTAL PROTEIN: 6.3 g/dL — AB (ref 6.5–8.1)

## 2016-12-20 LAB — VANCOMYCIN, TROUGH: Vancomycin Tr: 10 ug/mL — ABNORMAL LOW (ref 15–20)

## 2016-12-20 LAB — CBC
HEMATOCRIT: 34.1 % — AB (ref 39.0–52.0)
Hemoglobin: 11.2 g/dL — ABNORMAL LOW (ref 13.0–17.0)
MCH: 29.1 pg (ref 26.0–34.0)
MCHC: 32.8 g/dL (ref 30.0–36.0)
MCV: 88.6 fL (ref 78.0–100.0)
PLATELETS: 319 10*3/uL (ref 150–400)
RBC: 3.85 MIL/uL — ABNORMAL LOW (ref 4.22–5.81)
RDW: 13.4 % (ref 11.5–15.5)
WBC: 14.1 10*3/uL — AB (ref 4.0–10.5)

## 2016-12-20 MED ORDER — KETOROLAC TROMETHAMINE 30 MG/ML IJ SOLN: 15.0000 mg | Freq: Four times a day (QID) | INTRAMUSCULAR | Status: DC | PRN

## 2016-12-20 MED ORDER — VANCOMYCIN HCL 10 G IV SOLR: 1500.0000 mg | Freq: Three times a day (TID) | INTRAVENOUS | Status: DC

## 2016-12-20 MED ORDER — VANCOMYCIN HCL 10 G IV SOLR
1500.0000 mg | Freq: Three times a day (TID) | INTRAVENOUS | Status: DC
  Administered 2016-12-20 – 2016-12-22 (×6): 1500 mg via INTRAVENOUS
  Filled 2016-12-20 (×7): qty 1500

## 2016-12-20 NOTE — Progress Notes (Signed)
**Note De-Identified [place] Obfuscation** PROGRESS NOTE    Derek Donaldson  QZR:[id number] DOB: 1971-04-07 DOA: 12/17/2016 PCP: Dione Housekeeper, MD    Brief Narrative: Derek Donaldson is a 46 y.o. male with medical history significant of HTN, asthma, and recent suboccipital craniectomy with duraplasty by Dr. Cyndy Freeze on 7/26, due to incomplete [place] decompression that had previously treated with AV fistula. Patient was discharged home on 12/11/16 presents with worsening chills back pain neck pain and spasms with low-grade fevers.  Assessment & Plan:    *Meningitis after procedure:   -Possible hospital acquired vs chemical induced  , CSF analysis with elevated WCC mostly Neutrophils and elevated protein , continue IV Abx pending final Cx results , Ucx is growing different bacteria , on appropriate coverage with IV Abx , Toradol added PRN with relief of his headache , Decadron initiated by Nsx . -His Grady started to trend down after the Abx initiated , no fevers for the last 12 hours.  *H/O Chiari malformation type I: Patient recently underwent suboccipital craniectomy with duraplasty by Dr. Cyndy Freeze on 7/26, due to incomplete Chari decompression with AV fistula , Neurosurgery is following .   *Mild intermittent asthma: Stable. - Albuterol inhaler as needed for shortness of breath/wheezing.      DVT prophylaxis: (SCD') Code Status:(Full ) Family Communication: ("discussed with patient") Disposition Plan: (HOME)   Consultants:   ID/NSX.  Procedures:    Antimicrobials: (specify start and planned stop date. Auto populated tables are space occupying and do not give end dates)  NONE      Subjective:  Feels much better , Neck spasm and fevers resolved , no more headache , feels better .  Objective: Vitals:   12/19/16 1803 12/19/16 2124 12/20/16 0058 12/20/16 0451  BP: (!) 149/80 (!) 151/82 (!) 150/86 (!) 160/91  Pulse: 82 76 75 79  Resp: 20 20 20  (!) 22  Temp: 99.9 F (37.7 C) 98 F (36.7 C) 98.3 F (36.8 C) 98.3 F  (36.8 C)  TempSrc: Oral Oral Oral Oral  SpO2:  95% 95% 97%  Weight:      Height:        Intake/Output Summary (Last 24 hours) at 12/20/16 1137 Last data filed at 12/20/16 0900  Gross per 24 hour  Intake          2913.75 ml  Output             2900 ml  Net            13.75 ml   Filed Weights   12/17/16 1926 12/18/16 0544  Weight: 133.8 kg (295 lb) 132.2 kg (291 lb 8 oz)    Examination:  General exam: NAD. Respiratory system: Clear to auscultation. Respiratory effort normal. Cardiovascular system: RRR,S1S2. Gastrointestinal system: Abdomen is nondistended, soft and nontender. No organomegaly or masses felt. Normal bowel sounds heard. Central nervous system: Alert and oriented. No focal neurological deficitS , MENINGEAL SIGNS are negative. Extremities: Symmetric 5 x 5 power. Skin: No rashes, lesions or ulcers Psychiatry: Judgement and insight appear normal. Mood & affect appropriate.     Data Reviewed: I have personally reviewed following labs and imaging studies  CBC:  Recent Labs Lab 12/17/16 2108 12/18/16 0414 12/19/16 1502 12/20/16 0638  WBC 17.8* 18.8* 14.5* 14.1*  NEUTROABS 14.0* 17.6*  --   --   HGB 12.1* 11.6* 11.1* 11.2*  HCT 36.1* 34.2* 33.5* 34.1*  MCV 88.7 87.9 89.3 88.6  PLT 365 331 311 354   Basic Metabolic Panel: **Note De-Identified [place]to Obfuscation** Recent Labs Lab 12/17/16 2108 12/18/16 0414 12/20/16 0638  NA 132* 131* 138  K 3.4* 4.0 3.7  CL 99* 102 106  CO2 26 21* 22  GLUCOSE 109* 156* 138*  BUN 15 13 11   CREATININE 1.00 0.85 0.66  CALCIUM 9.2 8.8* 8.8*   GFR: Estimated Creatinine Clearance: 157.8 mL/min (by C-G formula based on SCr of 0.66 mg/dL). Liver Function Tests:  Recent Labs Lab 12/20/16 0638  AST 13*  ALT 36  ALKPHOS 34*  BILITOT 0.8  PROT 6.3*  ALBUMIN 3.1*   No results for input(s): LIPASE, AMYLASE in the last 168 hours. No results for input(s): AMMONIA in the last 168 hours. Coagulation Profile:  Recent Labs Lab 12/18/16 1758  INR 1.05    Cardiac Enzymes: No results for input(s): CKTOTAL, CKMB, CKMBINDEX, TROPONINI in the last 168 hours. BNP (last 3 results) No results for input(s): PROBNP in the last 8760 hours. HbA1C: No results for input(s): HGBA1C in the last 72 hours. CBG: No results for input(s): GLUCAP in the last 168 hours. Lipid Profile: No results for input(s): CHOL, HDL, LDLCALC, TRIG, CHOLHDL, LDLDIRECT in the last 72 hours. Thyroid Function Tests: No results for input(s): TSH, T4TOTAL, FREET4, T3FREE, THYROIDAB in the last 72 hours. Anemia Panel: No results for input(s): VITAMINB12, FOLATE, FERRITIN, TIBC, IRON, RETICCTPCT in the last 72 hours. Urine analysis:    Component Value Date/Time   COLORURINE YELLOW 12/18/2016 0214   APPEARANCEUR CLOUDY (A) 12/18/2016 0214   APPEARANCEUR Clear 08/13/2015 0000   LABSPEC 1.026 12/18/2016 0214   PHURINE 7.0 12/18/2016 0214   GLUCOSEU NEGATIVE 12/18/2016 0214   HGBUR NEGATIVE 12/18/2016 0214   BILIRUBINUR NEGATIVE 12/18/2016 0214   BILIRUBINUR Negative 08/13/2015 0000   KETONESUR NEGATIVE 12/18/2016 0214   PROTEINUR 30 (A) 12/18/2016 0214   UROBILINOGEN 0.2 07/16/2014 0935   NITRITE NEGATIVE 12/18/2016 0214   LEUKOCYTESUR TRACE (A) 12/18/2016 0214   LEUKOCYTESUR Negative 08/13/2015 0000   Sepsis Labs: @LABRCNTIP (procalcitonin:4,lacticidven:4)  ) Recent Results (from the past 240 hour(s))  Blood culture (routine x 2)     Status: None (Preliminary result)   Collection Time: 12/18/16  1:50 AM  Result Value Ref Range Status   Specimen Description BLOOD RIGHT HAND  Final   Special Requests   Final    BOTTLES DRAWN AEROBIC AND ANAEROBIC Blood Culture adequate volume   Culture NO GROWTH 2 DAYS  Final   Report Status PENDING  Incomplete  Blood culture (routine x 2)     Status: None (Preliminary result)   Collection Time: 12/18/16  2:00 AM  Result Value Ref Range Status   Specimen Description BLOOD LEFT HAND  Final   Special Requests   Final    BOTTLES  DRAWN AEROBIC AND ANAEROBIC Blood Culture adequate volume   Culture NO GROWTH 2 DAYS  Final   Report Status PENDING  Incomplete  Urine Culture     Status: Abnormal (Preliminary result)   Collection Time: 12/18/16  2:12 AM  Result Value Ref Range Status   Specimen Description URINE, RANDOM  Final   Special Requests NONE  Final   Culture (A)  Final    >=100,000 COLONIES/mL ENTEROCOCCUS FAECALIS 40,000 COLONIES/mL ESCHERICHIA COLI SUSCEPTIBILITIES TO FOLLOW    Report Status PENDING  Incomplete   Organism ID, Bacteria ENTEROCOCCUS FAECALIS (A)  Final      Susceptibility   Enterococcus faecalis - MIC*    AMPICILLIN <=2 SENSITIVE Sensitive     LEVOFLOXACIN 1 SENSITIVE Sensitive **Note De-Identified Kaplan Obfuscation** NITROFURANTOIN <=16 SENSITIVE Sensitive     VANCOMYCIN 1 SENSITIVE Sensitive     * >=100,000 COLONIES/mL ENTEROCOCCUS FAECALIS  Anaerobic culture     Status: None (Preliminary result)   Collection Time: 12/18/16  5:18 PM  Result Value Ref Range Status   Specimen Description CSF TUBE 2  Final   Special Requests NONE  Final   Culture   Final    NO GROWTH 2 DAYS NO ANAEROBES ISOLATED; CULTURE IN PROGRESS FOR 5 DAYS   Report Status PENDING  Incomplete  CSF culture     Status: None (Preliminary result)   Collection Time: 12/18/16  5:18 PM  Result Value Ref Range Status   Specimen Description CSF TUBE 2  Final   Special Requests NONE  Final   Gram Stain   Final    ABUNDANT WBC PRESENT, PREDOMINANTLY PMN NO ORGANISMS SEEN    Culture NO GROWTH 2 DAYS  Final   Report Status PENDING  Incomplete  Culture, fungus without smear     Status: None (Preliminary result)   Collection Time: 12/18/16  5:18 PM  Result Value Ref Range Status   Specimen Description CSF TUBE 2  Final   Special Requests NONE  Final   Culture NO GROWTH 2 DAYS  Final   Report Status PENDING  Incomplete         Radiology Studies: Dg Fluoro Guide Lumbar Puncture  Addendum Date: 12/18/2016   ADDENDUM REPORT: 12/18/2016 18:13 ADDENDUM:  Laboratory staff confirms that 10 cc of CSF was delivered. Electronically Signed   By: Franki Cabot M.D.   On: 12/18/2016 18:13   Result Date: 12/18/2016 CLINICAL DATA:  Meningitis after procedure. EXAM: DIAGNOSTIC LUMBAR PUNCTURE UNDER FLUOROSCOPIC GUIDANCE FLUOROSCOPY TIME:  Fluoroscopy Time:  24 seconds Number of Acquired Spot Images: 1 PROCEDURE: Informed consent was obtained from the patient prior to the procedure, including potential complications of headache, allergy, and pain. With the patient prone, the lower back was prepped with Betadine. 1% Lidocaine was used for local anesthesia. Lumbar puncture was performed at the L4-5 level using a 20 gauge needle with return of cloudy CSF. Opening pressure was not obtained due to inability to moved the patient to a lateral decubitus positioning. 11 ml of CSF were obtained for laboratory studies. The patient tolerated the procedure well and there were no apparent complications. IMPRESSION: Successful fluoroscopic guided lumbar puncture with 11 cc of cloudy CSF removed and sent to the laboratory. Electronically Signed: By: Franki Cabot M.D. On: 12/18/2016 17:54        Scheduled Meds: . dexamethasone  4 mg Intravenous Q6H  . diazepam  2 mg Oral BID   Continuous Infusions: . sodium chloride 75 mL/hr at 12/18/16 1623  . ceFEPime (MAXIPIME) IV Stopped (12/20/16 0542)  . vancomycin Stopped (12/20/16 1007)     LOS: 2 days    Time spent: 35 minutes .    Waldron Session, MD [facility]   If 7PM-7AM, please contact night-coverage [email or web address] Password [email or web address] 12/20/2016, 11:37 AM

## 2016-12-20 NOTE — Progress Notes (Signed)
**Note De-Identified [place]pa Obfuscation** Pharmacy Antibiotic Note  Derek Donaldson is a 46 y.o. male admitted on 12/17/2016 with r/o CNS infection.  Pharmacy has been consulted for vancomycin dosing. Pt is s/p decompressive craniotomy. Pt remains on vancomycin and cefepime. A vancomycin trough was drawn today and was SUBtherapeutic. Will make conservative adjustment given pt is already on high dose vancomycin and at risk for quick accumulation. If pt is still subtherapeutic at next vancomycin level, would consider lowering dose and changing regimen to q6h.   Plan: -Increase vancomycin to 1500 mg IV q8h -Continue cefepime 2g/8h -Monitor renal fx, uop and cultures -Obtain VT at next Css  Height: 5\' 10"  (177.8 cm) Weight: 291 lb 8 oz (132.2 kg) IBW/kg (Calculated) : 73  Temp (24hrs), Avg:99 F (37.2 C), Min:98 F (36.7 C), Max:100.9 F (38.3 C)   Recent Labs Lab 12/17/16 2108 12/18/16 0147 12/18/16 0414 12/19/16 1502 12/20/16 0638 12/20/16 1502  WBC 17.8*  --  18.8* 14.5* 14.1*  --   CREATININE 1.00  --  0.85  --  0.66  --   LATICACIDVEN  --  0.96  --   --   --   --   VANCOTROUGH  --   --   --   --   --  10*    Estimated Creatinine Clearance: 157.8 mL/min (by C-G formula based on SCr of 0.66 mg/dL).    Allergies  Allergen Reactions  . Contrast Media [Iodinated Diagnostic Agents] Hives and Swelling    Lip swelling - reaction after contrast dye given in the morning and shrimp eaten at lunch - unable to determine which caused the reaction - 2015 - pt has had contrast dye since then with pre-medication  . Shellfish Allergy Hives and Swelling    Lip swelling - reaction after contrast dye given in the morning and shrimp eaten at lunch - unable to determine which caused the reaction - 2015     CTX 8/4>>8/4 Zosyn 8/4>>8/4 Vanc 8/4>> Ampicillin 8/4>>8/5 Cefepime 8/5>>  8/4 CSF>> pink, cloudy, WBC 9800, glu 138 8/4 Ucx>>100K E. Faecalis, 40K E coli 8/4 BCx>>ngtd x2 8/4 CSF fungal>>ngtd 8/4 CSF  anaerobe>>ngtd   Derek Donaldson 12/20/2016 4:12 PM

## 2016-12-20 NOTE — Progress Notes (Signed)
**Note De-Identified [place]aker Obfuscation** Subjective: No new complaints   Antibiotics:  Anti-infectives    Start     Dose/Rate Route Frequency Ordered Stop   12/21/16 1700  vancomycin (VANCOCIN) 1,500 mg in sodium chloride 0.9 % 500 mL IVPB  Status:  Discontinued     1,500 mg 250 mL/hr over 120 Minutes Intravenous Every 8 hours 12/20/16 1626 12/20/16 1639   12/20/16 1800  vancomycin (VANCOCIN) 1,500 mg in sodium chloride 0.9 % 500 mL IVPB     1,500 mg 250 mL/hr over 120 Minutes Intravenous Every 8 hours 12/20/16 1639     12/19/16 0830  ceFEPIme (MAXIPIME) 2 g in dextrose 5 % 50 mL IVPB     2 g 100 mL/hr over 30 Minutes Intravenous Every 8 hours 12/19/16 0817     12/19/16 0000  vancomycin (VANCOCIN) 1,250 mg in sodium chloride 0.9 % 250 mL IVPB  Status:  Discontinued     1,250 mg 166.7 mL/hr over 90 Minutes Intravenous Every 8 hours 12/18/16 1514 12/20/16 1626   12/18/16 2200  ampicillin (OMNIPEN) 2 g in sodium chloride 0.9 % 50 mL IVPB  Status:  Discontinued     2 g 150 mL/hr over 20 Minutes Intravenous Every 4 hours 12/18/16 2124 12/19/16 1432   12/18/16 2200  cefTRIAXone (ROCEPHIN) 2 g in dextrose 5 % 50 mL IVPB  Status:  Discontinued     2 g 100 mL/hr over 30 Minutes Intravenous Every 12 hours 12/18/16 2125 12/19/16 0817   12/18/16 2130  cefTRIAXone (ROCEPHIN) 2 g in dextrose 5 % 50 mL IVPB  Status:  Discontinued     2 g 100 mL/hr over 30 Minutes Intravenous Every 24 hours 12/18/16 2115 12/18/16 2125   12/18/16 1800  piperacillin-tazobactam (ZOSYN) IVPB 3.375 g  Status:  Discontinued     3.375 g 100 mL/hr over 30 Minutes Intravenous Every 6 hours 12/18/16 1450 12/18/16 1500   12/18/16 1600  piperacillin-tazobactam (ZOSYN) IVPB 3.375 g  Status:  Discontinued     3.375 g 12.5 mL/hr over 240 Minutes Intravenous Every 8 hours 12/18/16 1501 12/18/16 2115   12/18/16 1600  vancomycin (VANCOCIN) 2,500 mg in sodium chloride 0.9 % 500 mL IVPB     2,500 mg 250 mL/hr over 120 Minutes Intravenous  Once 12/18/16 1510  12/18/16 2004   12/18/16 1500  vancomycin (VANCOCIN) IVPB 1000 mg/200 mL premix  Status:  Discontinued     1,000 mg 200 mL/hr over 60 Minutes Intravenous Every 24 hours 12/18/16 1450 12/18/16 1510   12/18/16 0845  piperacillin-tazobactam (ZOSYN) IVPB 3.375 g  Status:  Discontinued     3.375 g 12.5 mL/hr over 240 Minutes Intravenous Every 6 hours 12/18/16 0841 12/18/16 0849      Medications: Scheduled Meds: . dexamethasone  4 mg Intravenous Q6H  . diazepam  2 mg Oral BID   Continuous Infusions: . sodium chloride 75 mL/hr at 12/20/16 1218  . ceFEPime (MAXIPIME) IV Stopped (12/20/16 1517)  . vancomycin 1,500 mg (12/20/16 1741)   PRN Meds:.acetaminophen **OR** acetaminophen (TYLENOL) oral liquid 160 mg/5 mL **OR** acetaminophen, albuterol, cyclobenzaprine, HYDROmorphone (DILAUDID) injection, ketorolac, LORazepam, oxyCODONE-acetaminophen, senna-docusate    Objective: Weight change:   Intake/Output Summary (Last 24 hours) at 12/20/16 1815 Last data filed at 12/20/16 0900  Gross per 24 hour  Intake          2913.75 ml  Output             2200 ml  Net **Note De-Identified [place] Obfuscation** 713.75 ml   Blood pressure (!) 148/82, pulse 81, temperature 98.3 F (36.8 C), temperature source Oral, resp. rate 17, height 5\' 10"  (1.778 m), weight 291 lb 8 oz (132.2 kg), SpO2 97 %. Temp:  [98 F (36.7 C)-98.3 F (36.8 C)] 98.3 F (36.8 C) (08/06 1425) Pulse Rate:  [75-81] 81 (08/06 1425) Resp:  [17-22] 17 (08/06 1425) BP: (148-160)/(82-91) 148/82 (08/06 1425) SpO2:  [95 %-97 %] 97 % (08/06 1425)  Physical Exam: General: Alert and awake, oriented x3, not in any acute distress. HEENT: anicteric sclera, pupils reactive to light and accommodation, EOMI CVS regular rate, normal r,  no murmur rubs or gallops Chest: clear to auscultation bilaterally, no wheezing, rales or rhonchi Abdomen: soft nontender, nondistended, normal bowel sounds, Extremities: no  clubbing or edema noted bilaterally Skin: no rashes  Neuro:  nonfocal  CBC:  CBC Latest Ref Rng & Units 12/20/2016 12/19/2016 12/18/2016  WBC 4.0 - 10.5 K/uL 14.1(H) 14.5(H) 18.8(H)  Hemoglobin 13.0 - 17.0 g/dL 11.2(L) 11.1(L) 11.6(L)  Hematocrit 39.0 - 52.0 % 34.1(L) 33.5(L) 34.2(L)  Platelets 150 - 400 K/uL 319 311 331     BMET  Recent Labs  12/18/16 0414 12/20/16 0638  NA 131* 138  K 4.0 3.7  CL 102 106  CO2 21* 22  GLUCOSE 156* 138*  BUN 13 11  CREATININE 0.85 0.66  CALCIUM 8.8* 8.8*     Liver Panel   Recent Labs  12/20/16 0638  PROT 6.3*  ALBUMIN 3.1*  AST 13*  ALT 36  ALKPHOS 34*  BILITOT 0.8       Sedimentation Rate No results for input(s): ESRSEDRATE in the last 72 hours. C-Reactive Protein No results for input(s): CRP in the last 72 hours.  Micro Results: Recent Results (from the past 720 hour(s))  MRSA PCR Screening     Status: None   Collection Time: 12/09/16 11:39 PM  Result Value Ref Range Status   MRSA by PCR NEGATIVE NEGATIVE Final    Comment:        The GeneXpert MRSA Assay (FDA approved for NASAL specimens only), is one component of a comprehensive MRSA colonization surveillance program. It is not intended to diagnose MRSA infection nor to guide or monitor treatment for MRSA infections.   Blood culture (routine x 2)     Status: None (Preliminary result)   Collection Time: 12/18/16  1:50 AM  Result Value Ref Range Status   Specimen Description BLOOD RIGHT HAND  Final   Special Requests   Final    BOTTLES DRAWN AEROBIC AND ANAEROBIC Blood Culture adequate volume   Culture NO GROWTH 2 DAYS  Final   Report Status PENDING  Incomplete  Blood culture (routine x 2)     Status: None (Preliminary result)   Collection Time: 12/18/16  2:00 AM  Result Value Ref Range Status   Specimen Description BLOOD LEFT HAND  Final   Special Requests   Final    BOTTLES DRAWN AEROBIC AND ANAEROBIC Blood Culture adequate volume   Culture NO GROWTH 2 DAYS  Final   Report Status PENDING  Incomplete  Urine  Culture     Status: Abnormal (Preliminary result)   Collection Time: 12/18/16  2:12 AM  Result Value Ref Range Status   Specimen Description URINE, RANDOM  Final   Special Requests NONE  Final   Culture (A)  Final    >=100,000 COLONIES/mL ENTEROCOCCUS FAECALIS 40,000 COLONIES/mL ESCHERICHIA COLI SUSCEPTIBILITIES TO FOLLOW    Report Status PENDING **Note De-Identified S[place] Obfuscation** Incomplete   Organism ID, Bacteria ENTEROCOCCUS FAECALIS (A)  Final      Susceptibility   Enterococcus faecalis - MIC*    AMPICILLIN <=2 SENSITIVE Sensitive     LEVOFLOXACIN 1 SENSITIVE Sensitive     NITROFURANTOIN <=16 SENSITIVE Sensitive     VANCOMYCIN 1 SENSITIVE Sensitive     * >=100,000 COLONIES/mL ENTEROCOCCUS FAECALIS  Anaerobic culture     Status: None (Preliminary result)   Collection Time: 12/18/16  5:18 PM  Result Value Ref Range Status   Specimen Description CSF TUBE 2  Final   Special Requests NONE  Final   Culture   Final    NO GROWTH 2 DAYS NO ANAEROBES ISOLATED; CULTURE IN PROGRESS FOR 5 DAYS   Report Status PENDING  Incomplete  CSF culture     Status: None (Preliminary result)   Collection Time: 12/18/16  5:18 PM  Result Value Ref Range Status   Specimen Description CSF TUBE 2  Final   Special Requests NONE  Final   Gram Stain   Final    ABUNDANT WBC PRESENT, PREDOMINANTLY PMN NO ORGANISMS SEEN    Culture NO GROWTH 2 DAYS  Final   Report Status PENDING  Incomplete  Culture, fungus without smear     Status: None (Preliminary result)   Collection Time: 12/18/16  5:18 PM  Result Value Ref Range Status   Specimen Description CSF TUBE 2  Final   Special Requests NONE  Final   Culture NO GROWTH 2 DAYS  Final   Report Status PENDING  Incomplete    Studies/Results: No results found.    Assessment/Plan:  INTERVAL HISTORY:  cx no growth   Principal Problem:   Meningitis after procedure Active Problems:   Chiari malformation type I (HCC)   Leukocytosis   Mild intermittent asthma    Hyponatremia    Derek Donaldson is a 46 y.o. male with possible chemical vs bacterial meningitis  Continue the current antibiotics but if NOTHING grows will stop abx and observe on steroids alone  If worsens then repeat LP  Pyuria: he does NOT have UTI. He does not have symptoms and hsi urine CFU are inconsequential   LOS: 2 days   Alcide Evener 12/20/2016, 6:15 PM

## 2016-12-20 NOTE — Progress Notes (Signed)
**Note De-Identified [place] Obfuscation** Patient ID: Derek Donaldson, male   DOB: [birth year], 46 y.o.   MRN: [id number] Subjective:  The patient is alert and pleasant. He continues to complain of neck painl 2 on a scale of 10 presently. His wife is at the bedside.  Objective: Vital signs in last 24 hours: Temp:  [98 F (36.7 C)-101 F (38.3 C)] 98.3 F (36.8 C) (08/06 0451) Pulse Rate:  [75-84] 79 (08/06 0451) Resp:  [20-22] 22 (08/06 0451) BP: (140-160)/(67-91) 160/91 (08/06 0451) SpO2:  [95 %-97 %] 97 % (08/06 0451)  Intake/Output from previous day: 08/05 0701 - 08/06 0700 In: 2793.8 [I.V.:1843.8; IV Piggyback:950] Out: 2900 [Urine:2900] Intake/Output this shift: Total I/O In: 120 [P.O.:120] Out: -   Physical exam the patient's cervical incision is healing well. There is no drainage or erythema. He is alert and oriented. He moves all 4 extremities well.  Lab Results:  Recent Labs  12/19/16 1502 12/20/16 0638  WBC 14.5* 14.1*  HGB 11.1* 11.2*  HCT 33.5* 34.1*  PLT 311 319   BMET  Recent Labs  12/18/16 0414 12/20/16 0638  NA 131* 138  K 4.0 3.7  CL 102 106  CO2 21* 22  GLUCOSE 156* 138*  BUN 13 11  CREATININE 0.85 0.66  CALCIUM 8.8* 8.8*    Studies/Results: Dg Fluoro Guide Lumbar Puncture  Addendum Date: 12/18/2016   ADDENDUM REPORT: 12/18/2016 18:13 ADDENDUM: Laboratory staff confirms that 10 cc of CSF was delivered. Electronically Signed   By: Franki Cabot M.D.   On: 12/18/2016 18:13   Result Date: 12/18/2016 CLINICAL DATA:  Meningitis after procedure. EXAM: DIAGNOSTIC LUMBAR PUNCTURE UNDER FLUOROSCOPIC GUIDANCE FLUOROSCOPY TIME:  Fluoroscopy Time:  24 seconds Number of Acquired Spot Images: 1 PROCEDURE: Informed consent was obtained from the patient prior to the procedure, including potential complications of headache, allergy, and pain. With the patient prone, the lower back was prepped with Betadine. 1% Lidocaine was used for local anesthesia. Lumbar puncture was performed at the L4-5 level using a 20  gauge needle with return of cloudy CSF. Opening pressure was not obtained due to inability to moved the patient to a lateral decubitus positioning. 11 ml of CSF were obtained for laboratory studies. The patient tolerated the procedure well and there were no apparent complications. IMPRESSION: Successful fluoroscopic guided lumbar puncture with 11 cc of cloudy CSF removed and sent to the laboratory. Electronically Signed: By: Franki Cabot M.D. On: 12/18/2016 17:54    Assessment/Plan: Meningitis: The cultures continued to be negative. He is on empiric antibiotics. Dr. Baxter Flattery, infectious disease has seen the patient. I will leave the duration of antibiotics up to her expertise. I don't think he needs steroids presently.  Enterococci UTI: He is on antibiotics.  LOS: 2 days     Derek Donaldson D 12/20/2016, 12:42 PM

## 2016-12-21 LAB — CBC
HEMATOCRIT: 30.9 % — AB (ref 39.0–52.0)
HEMOGLOBIN: 10.4 g/dL — AB (ref 13.0–17.0)
MCH: 30.1 pg (ref 26.0–34.0)
MCHC: 33.7 g/dL (ref 30.0–36.0)
MCV: 89.3 fL (ref 78.0–100.0)
Platelets: 324 10*3/uL (ref 150–400)
RBC: 3.46 MIL/uL — AB (ref 4.22–5.81)
RDW: 13.8 % (ref 11.5–15.5)
WBC: 15.1 10*3/uL — ABNORMAL HIGH (ref 4.0–10.5)

## 2016-12-21 LAB — COMPREHENSIVE METABOLIC PANEL
ALK PHOS: 30 U/L — AB (ref 38–126)
ALT: 29 U/L (ref 17–63)
ANION GAP: 7 (ref 5–15)
AST: 13 U/L — ABNORMAL LOW (ref 15–41)
Albumin: 2.9 g/dL — ABNORMAL LOW (ref 3.5–5.0)
BILIRUBIN TOTAL: 0.5 mg/dL (ref 0.3–1.2)
BUN: 14 mg/dL (ref 6–20)
CALCIUM: 8.6 mg/dL — AB (ref 8.9–10.3)
CO2: 23 mmol/L (ref 22–32)
CREATININE: 0.65 mg/dL (ref 0.61–1.24)
Chloride: 108 mmol/L (ref 101–111)
Glucose, Bld: 135 mg/dL — ABNORMAL HIGH (ref 65–99)
Potassium: 3.6 mmol/L (ref 3.5–5.1)
SODIUM: 138 mmol/L (ref 135–145)
TOTAL PROTEIN: 5.9 g/dL — AB (ref 6.5–8.1)

## 2016-12-21 LAB — URINE CULTURE: Culture: >=100000 — AB

## 2016-12-21 MED ORDER — DEXAMETHASONE SODIUM PHOSPHATE 4 MG/ML IJ SOLN
4.0000 mg | Freq: Three times a day (TID) | INTRAMUSCULAR | Status: DC
  Administered 2016-12-21 – 2016-12-22 (×3): 4 mg via INTRAVENOUS
  Filled 2016-12-21 (×3): qty 1

## 2016-12-21 NOTE — Progress Notes (Signed)
**Note De-Identified [place] Obfuscation** Subjective: No new complaints, resting comfortably   Antibiotics:  Anti-infectives    Start     Dose/Rate Route Frequency Ordered Stop   12/21/16 1700  vancomycin (VANCOCIN) 1,500 mg in sodium chloride 0.9 % 500 mL IVPB  Status:  Discontinued     1,500 mg 250 mL/hr over 120 Minutes Intravenous Every 8 hours 12/20/16 1626 12/20/16 1639   12/20/16 1800  vancomycin (VANCOCIN) 1,500 mg in sodium chloride 0.9 % 500 mL IVPB     1,500 mg 250 mL/hr over 120 Minutes Intravenous Every 8 hours 12/20/16 1639     12/19/16 0830  ceFEPIme (MAXIPIME) 2 g in dextrose 5 % 50 mL IVPB     2 g 100 mL/hr over 30 Minutes Intravenous Every 8 hours 12/19/16 0817     12/19/16 0000  vancomycin (VANCOCIN) 1,250 mg in sodium chloride 0.9 % 250 mL IVPB  Status:  Discontinued     1,250 mg 166.7 mL/hr over 90 Minutes Intravenous Every 8 hours 12/18/16 1514 12/20/16 1626   12/18/16 2200  ampicillin (OMNIPEN) 2 g in sodium chloride 0.9 % 50 mL IVPB  Status:  Discontinued     2 g 150 mL/hr over 20 Minutes Intravenous Every 4 hours 12/18/16 2124 12/19/16 1432   12/18/16 2200  cefTRIAXone (ROCEPHIN) 2 g in dextrose 5 % 50 mL IVPB  Status:  Discontinued     2 g 100 mL/hr over 30 Minutes Intravenous Every 12 hours 12/18/16 2125 12/19/16 0817   12/18/16 2130  cefTRIAXone (ROCEPHIN) 2 g in dextrose 5 % 50 mL IVPB  Status:  Discontinued     2 g 100 mL/hr over 30 Minutes Intravenous Every 24 hours 12/18/16 2115 12/18/16 2125   12/18/16 1800  piperacillin-tazobactam (ZOSYN) IVPB 3.375 g  Status:  Discontinued     3.375 g 100 mL/hr over 30 Minutes Intravenous Every 6 hours 12/18/16 1450 12/18/16 1500   12/18/16 1600  piperacillin-tazobactam (ZOSYN) IVPB 3.375 g  Status:  Discontinued     3.375 g 12.5 mL/hr over 240 Minutes Intravenous Every 8 hours 12/18/16 1501 12/18/16 2115   12/18/16 1600  vancomycin (VANCOCIN) 2,500 mg in sodium chloride 0.9 % 500 mL IVPB     2,500 mg 250 mL/hr over 120 Minutes Intravenous   Once 12/18/16 1510 12/18/16 2004   12/18/16 1500  vancomycin (VANCOCIN) IVPB 1000 mg/200 mL premix  Status:  Discontinued     1,000 mg 200 mL/hr over 60 Minutes Intravenous Every 24 hours 12/18/16 1450 12/18/16 1510   12/18/16 0845  piperacillin-tazobactam (ZOSYN) IVPB 3.375 g  Status:  Discontinued     3.375 g 12.5 mL/hr over 240 Minutes Intravenous Every 6 hours 12/18/16 0841 12/18/16 0849      Medications: Scheduled Meds: . dexamethasone  4 mg Intravenous Q6H  . diazepam  2 mg Oral BID   Continuous Infusions: . sodium chloride 75 mL/hr at 12/20/16 1218  . ceFEPime (MAXIPIME) IV Stopped (12/21/16 0617)  . vancomycin Stopped (12/21/16 0348)   PRN Meds:.acetaminophen **OR** acetaminophen (TYLENOL) oral liquid 160 mg/5 mL **OR** acetaminophen, albuterol, cyclobenzaprine, HYDROmorphone (DILAUDID) injection, ketorolac, LORazepam, oxyCODONE-acetaminophen, senna-docusate    Objective: Weight change:   Intake/Output Summary (Last 24 hours) at 12/21/16 0819 Last data filed at 12/21/16 0557  Gross per 24 hour  Intake          3018.75 ml  Output                0 ml **Note De-Identified [place] Obfuscation** Net          3018.75 ml   Blood pressure (!) 163/91, pulse 69, temperature 98 F (36.7 C), temperature source Oral, resp. rate 18, height 5\' 10"  (1.778 m), weight 291 lb 8 oz (132.2 kg), SpO2 97 %. Temp:  [98 F (36.7 C)-98.9 F (37.2 C)] 98 F (36.7 C) (08/07 0428) Pulse Rate:  [67-83] 69 (08/07 0428) Resp:  [17-18] 18 (08/07 0428) BP: (148-163)/(82-97) 163/91 (08/07 0428) SpO2:  [95 %-97 %] 97 % (08/07 0428)  Physical Exam: General: Alert and awake, oriented x3, not in any acute distress. HEENT: anicteric sclera, pupils reactive to light and accommodation, EOMI CVS regular rate, normal r,   Chest: no wheezing, resp distress Abdomen: soft nontender, nondistended,    Skin: no rashes  Neuro: nonfocal  CBC:  CBC Latest Ref Rng & Units 12/21/2016 12/20/2016 12/19/2016  WBC 4.0 - 10.5 K/uL 15.1(H) 14.1(H) 14.5(H)    Hemoglobin 13.0 - 17.0 g/dL 10.4(L) 11.2(L) 11.1(L)  Hematocrit 39.0 - 52.0 % 30.9(L) 34.1(L) 33.5(L)  Platelets 150 - 400 K/uL 324 319 311     BMET  Recent Labs  12/20/16 0638 12/21/16 0513  NA 138 138  K 3.7 3.6  CL 106 108  CO2 22 23  GLUCOSE 138* 135*  BUN 11 14  CREATININE 0.66 0.65  CALCIUM 8.8* 8.6*     Liver Panel   Recent Labs  12/20/16 0638 12/21/16 0513  PROT 6.3* 5.9*  ALBUMIN 3.1* 2.9*  AST 13* 13*  ALT 36 29  ALKPHOS 34* 30*  BILITOT 0.8 0.5       Sedimentation Rate No results for input(s): ESRSEDRATE in the last 72 hours. C-Reactive Protein No results for input(s): CRP in the last 72 hours.  Micro Results: Recent Results (from the past 720 hour(s))  MRSA PCR Screening     Status: None   Collection Time: 12/09/16 11:39 PM  Result Value Ref Range Status   MRSA by PCR NEGATIVE NEGATIVE Final    Comment:        The GeneXpert MRSA Assay (FDA approved for NASAL specimens only), is one component of a comprehensive MRSA colonization surveillance program. It is not intended to diagnose MRSA infection nor to guide or monitor treatment for MRSA infections.   Blood culture (routine x 2)     Status: None (Preliminary result)   Collection Time: 12/18/16  1:50 AM  Result Value Ref Range Status   Specimen Description BLOOD RIGHT HAND  Final   Special Requests   Final    BOTTLES DRAWN AEROBIC AND ANAEROBIC Blood Culture adequate volume   Culture NO GROWTH 2 DAYS  Final   Report Status PENDING  Incomplete  Blood culture (routine x 2)     Status: None (Preliminary result)   Collection Time: 12/18/16  2:00 AM  Result Value Ref Range Status   Specimen Description BLOOD LEFT HAND  Final   Special Requests   Final    BOTTLES DRAWN AEROBIC AND ANAEROBIC Blood Culture adequate volume   Culture NO GROWTH 2 DAYS  Final   Report Status PENDING  Incomplete  Urine Culture     Status: Abnormal   Collection Time: 12/18/16  2:12 AM  Result Value Ref  Range Status   Specimen Description URINE, RANDOM  Final   Special Requests NONE  Final   Culture (A)  Final    >=100,000 COLONIES/mL ENTEROCOCCUS FAECALIS 40,000 COLONIES/mL ESCHERICHIA COLI    Report Status 12/21/2016 FINAL  Final **Note De-Identified [place] Obfuscation** Organism ID, Bacteria ENTEROCOCCUS FAECALIS (A)  Final   Organism ID, Bacteria ESCHERICHIA COLI (A)  Final      Susceptibility   Escherichia coli - MIC*    AMPICILLIN 8 SENSITIVE Sensitive     CEFAZOLIN <=4 SENSITIVE Sensitive     CEFTRIAXONE <=1 SENSITIVE Sensitive     CIPROFLOXACIN <=0.25 SENSITIVE Sensitive     GENTAMICIN <=1 SENSITIVE Sensitive     IMIPENEM <=0.25 SENSITIVE Sensitive     NITROFURANTOIN <=16 SENSITIVE Sensitive     TRIMETH/SULFA <=20 SENSITIVE Sensitive     AMPICILLIN/SULBACTAM 4 SENSITIVE Sensitive     PIP/TAZO <=4 SENSITIVE Sensitive     Extended ESBL NEGATIVE Sensitive     * 40,000 COLONIES/mL ESCHERICHIA COLI   Enterococcus faecalis - MIC*    AMPICILLIN <=2 SENSITIVE Sensitive     LEVOFLOXACIN 1 SENSITIVE Sensitive     NITROFURANTOIN <=16 SENSITIVE Sensitive     VANCOMYCIN 1 SENSITIVE Sensitive     * >=100,000 COLONIES/mL ENTEROCOCCUS FAECALIS  Anaerobic culture     Status: None (Preliminary result)   Collection Time: 12/18/16  5:18 PM  Result Value Ref Range Status   Specimen Description CSF TUBE 2  Final   Special Requests NONE  Final   Culture   Final    NO GROWTH 2 DAYS NO ANAEROBES ISOLATED; CULTURE IN PROGRESS FOR 5 DAYS   Report Status PENDING  Incomplete  CSF culture     Status: None (Preliminary result)   Collection Time: 12/18/16  5:18 PM  Result Value Ref Range Status   Specimen Description CSF TUBE 2  Final   Special Requests NONE  Final   Gram Stain   Final    ABUNDANT WBC PRESENT, PREDOMINANTLY PMN NO ORGANISMS SEEN    Culture NO GROWTH 2 DAYS  Final   Report Status PENDING  Incomplete  Culture, fungus without smear     Status: None (Preliminary result)   Collection Time: 12/18/16  5:18 PM  Result  Value Ref Range Status   Specimen Description CSF TUBE 2  Final   Special Requests NONE  Final   Culture NO GROWTH 2 DAYS  Final   Report Status PENDING  Incomplete    Studies/Results: No results found.    Assessment/Plan:  INTERVAL HISTORY:  cx no growth on CSF cultures still  Principal Problem:   Meningitis after procedure Active Problems:   Chiari malformation type I ([place])   Leukocytosis   Mild intermittent asthma   Hyponatremia   Chemical meningitis    Derek Donaldson is a 46 y.o. male with possible chemical vs bacterial meningitis  Continue the current antibiotics but if NOTHING grows will stop abx and observe on steroids alone  If worsens OFF abx then repeat LP     LOS: 3 days   Alcide Evener 12/21/2016, 8:19 AM

## 2016-12-21 NOTE — Progress Notes (Signed)
**Note De-Identified [place] Obfuscation** Patient ID: Derek Donaldson, male   DOB: [birth year], 46 y.o.   MRN: [id number] Subjective:  The patient is alert and pleasant. He continues to improve.  Objective: Vital signs in last 24 hours: Temp:  [98 F (36.7 C)-98.9 F (37.2 C)] 98 F (36.7 C) (08/07 0428) Pulse Rate:  [67-83] 69 (08/07 0428) Resp:  [17-18] 18 (08/07 0428) BP: (148-163)/(82-97) 163/91 (08/07 0428) SpO2:  [95 %-97 %] 97 % (08/07 0428)  Intake/Output from previous day: 08/06 0701 - 08/07 0700 In: 3018.8 [P.O.:120; I.V.:1748.8; IV Piggyback:1150] Out: -  Intake/Output this shift: No intake/output data recorded.  Physical exam the patient is alert and oriented. He is moving all 4 extremities well.  Lab Results:  Recent Labs  12/20/16 0638 12/21/16 0513  WBC 14.1* 15.1*  HGB 11.2* 10.4*  HCT 34.1* 30.9*  PLT 319 324   BMET  Recent Labs  12/20/16 0638 12/21/16 0513  NA 138 138  K 3.7 3.6  CL 106 108  CO2 22 23  GLUCOSE 138* 135*  BUN 11 14  CREATININE 0.66 0.65  CALCIUM 8.8* 8.6*    Studies/Results: No results found.  Assessment/Plan: Meningitis: The cultures continued to be negative. I appreciate Dr. Arlyss Queen input on antibiotic management. I agree with stopping the antibiotics if his cultures remained negative as this may be an aseptic meningitis.  LOS: 3 days     [name] D 12/21/2016, 7:53 AM

## 2016-12-21 NOTE — Progress Notes (Signed)
**Note De-Identified [place] Obfuscation** PROGRESS NOTE    Derek Donaldson  TGY:[id number] DOB: 1970/11/22 DOA: 12/17/2016 PCP: Dione Housekeeper, MD    Brief Narrative: Derek Donaldson is a 46 y.o. male with medical history significant of HTN, asthma, and recent suboccipital craniectomy with duraplasty by Dr. Cyndy Freeze on 7/26, due to incomplete Pierron decompression that had previously treated with AV fistula. Patient was discharged home on 12/11/16 presents with worsening chills back pain neck pain and spasms with low-grade fevers.  Assessment & Plan:    *Meningitis after procedure:   -Possible hospital acquired vs chemical induced  , CSF analysis with elevated WCC mostly Neutrophils and elevated protein , continue IV Abx pending final Cx results , Ucx is growing different bacteria( ecoli and enterococcus fecalis) , on appropriate coverage with IV Abx , ID is advising to continue pending finalization of the Cx, I would be very careful to stop the Abx now as his fever and symptoms improved significantly after the Abx initiation . -His Altheimer started to trend down after the Abx initiated , no fevers for the last 24 hours. -wean off the steroids slowly and dilaudid now.  *H/O Chiari malformation type I: Patient recently underwent suboccipital craniectomy with duraplasty by Dr. Cyndy Freeze on 7/26, due to incomplete Chari decompression with AV fistula , Neurosurgery is following .   *Mild intermittent asthma: Stable. - Albuterol inhaler as needed for shortness of breath/wheezing.      DVT prophylaxis: (SCD') Code Status:(Full ) Family Communication: ("discussed with patient") Disposition Plan: (HOME)   Consultants:   ID/NSX.  Procedures:     Antimicrobials:  Cefepim and Vanc day#3   Subjective: Cheerful and smiley , no more headache or issues , stable .  Objective: Vitals:   12/20/16 2029 12/21/16 0020 12/21/16 0428 12/21/16 1324  BP: (!) 160/96 (!) 156/97 (!) 163/91 (!) 161/93  Pulse: 83 67 69 (!) 57  Resp: 18 18 18 18     Temp: 98.2 F (36.8 C) 98.9 F (37.2 C) 98 F (36.7 C) 98.1 F (36.7 C)  TempSrc: Oral Oral Oral Oral  SpO2: 97% 95% 97% 95%  Weight:      Height:        Intake/Output Summary (Last 24 hours) at 12/21/16 1423 Last data filed at 12/21/16 0900  Gross per 24 hour  Intake          3138.75 ml  Output                0 ml  Net          3138.75 ml   Filed Weights   12/17/16 1926 12/18/16 0544  Weight: 133.8 kg (295 lb) 132.2 kg (291 lb 8 oz)    Examination:  General exam: NAD. Respiratory system: Clear to auscultation. Respiratory effort normal. Cardiovascular system: RRR,S1S2. Gastrointestinal system: Abdomen is nondistended, soft and nontender. No organomegaly or masses felt. Normal bowel sounds heard. Central nervous system: Alert and oriented. No focal neurological deficitS , MENINGEAL SIGNS are negative. Extremities: Symmetric 5 x 5 power. Skin: No rashes, lesions or ulcers Psychiatry: Judgement and insight appear normal. Mood & affect appropriate.     Data Reviewed: I have personally reviewed following labs and imaging studies  CBC:  Recent Labs Lab 12/17/16 2108 12/18/16 0414 12/19/16 1502 12/20/16 0638 12/21/16 0513  WBC 17.8* 18.8* 14.5* 14.1* 15.1*  NEUTROABS 14.0* 17.6*  --   --   --   HGB 12.1* 11.6* 11.1* 11.2* 10.4*  HCT 36.1* 34.2* 33.5* 34.1* 30.9* **Note De-Identified [place] Obfuscation** MCV 88.7 87.9 89.3 88.6 89.3  PLT 365 331 311 319 660   Basic Metabolic Panel:  Recent Labs Lab 12/17/16 2108 12/18/16 0414 12/20/16 0638 12/21/16 0513  NA 132* 131* 138 138  K 3.4* 4.0 3.7 3.6  CL 99* 102 106 108  CO2 26 21* 22 23  GLUCOSE 109* 156* 138* 135*  BUN 15 13 11 14   CREATININE 1.00 0.85 0.66 0.65  CALCIUM 9.2 8.8* 8.8* 8.6*   GFR: Estimated Creatinine Clearance: 157.8 mL/min (by C-G formula based on SCr of 0.65 mg/dL). Liver Function Tests:  Recent Labs Lab 12/20/16 0638 12/21/16 0513  AST 13* 13*  ALT 36 29  ALKPHOS 34* 30*  BILITOT 0.8 0.5  PROT 6.3* 5.9*  ALBUMIN  3.1* 2.9*   No results for input(s): LIPASE, AMYLASE in the last 168 hours. No results for input(s): AMMONIA in the last 168 hours. Coagulation Profile:  Recent Labs Lab 12/18/16 1758  INR 1.05   Cardiac Enzymes: No results for input(s): CKTOTAL, CKMB, CKMBINDEX, TROPONINI in the last 168 hours. BNP (last 3 results) No results for input(s): PROBNP in the last 8760 hours. HbA1C: No results for input(s): HGBA1C in the last 72 hours. CBG: No results for input(s): GLUCAP in the last 168 hours. Lipid Profile: No results for input(s): CHOL, HDL, LDLCALC, TRIG, CHOLHDL, LDLDIRECT in the last 72 hours. Thyroid Function Tests: No results for input(s): TSH, T4TOTAL, FREET4, T3FREE, THYROIDAB in the last 72 hours. Anemia Panel: No results for input(s): VITAMINB12, FOLATE, FERRITIN, TIBC, IRON, RETICCTPCT in the last 72 hours. Urine analysis:    Component Value Date/Time   COLORURINE YELLOW 12/18/2016 0214   APPEARANCEUR CLOUDY (A) 12/18/2016 0214   APPEARANCEUR Clear 08/13/2015 0000   LABSPEC 1.026 12/18/2016 0214   PHURINE 7.0 12/18/2016 0214   GLUCOSEU NEGATIVE 12/18/2016 0214   HGBUR NEGATIVE 12/18/2016 0214   BILIRUBINUR NEGATIVE 12/18/2016 0214   BILIRUBINUR Negative 08/13/2015 0000   KETONESUR NEGATIVE 12/18/2016 0214   PROTEINUR 30 (A) 12/18/2016 0214   UROBILINOGEN 0.2 07/16/2014 0935   NITRITE NEGATIVE 12/18/2016 0214   LEUKOCYTESUR TRACE (A) 12/18/2016 0214   LEUKOCYTESUR Negative 08/13/2015 0000   Sepsis Labs: @LABRCNTIP (procalcitonin:4,lacticidven:4)  ) Recent Results (from the past 240 hour(s))  Blood culture (routine x 2)     Status: None (Preliminary result)   Collection Time: 12/18/16  1:50 AM  Result Value Ref Range Status   Specimen Description BLOOD RIGHT HAND  Final   Special Requests   Final    BOTTLES DRAWN AEROBIC AND ANAEROBIC Blood Culture adequate volume   Culture NO GROWTH 2 DAYS  Final   Report Status PENDING  Incomplete  Blood culture  (routine x 2)     Status: None (Preliminary result)   Collection Time: 12/18/16  2:00 AM  Result Value Ref Range Status   Specimen Description BLOOD LEFT HAND  Final   Special Requests   Final    BOTTLES DRAWN AEROBIC AND ANAEROBIC Blood Culture adequate volume   Culture NO GROWTH 2 DAYS  Final   Report Status PENDING  Incomplete  Urine Culture     Status: Abnormal   Collection Time: 12/18/16  2:12 AM  Result Value Ref Range Status   Specimen Description URINE, RANDOM  Final   Special Requests NONE  Final   Culture (A)  Final    >=100,000 COLONIES/mL ENTEROCOCCUS FAECALIS 40,000 COLONIES/mL ESCHERICHIA COLI    Report Status 12/21/2016 FINAL  Final   Organism ID, Bacteria ENTEROCOCCUS FAECALIS ( **Note De-Identified [place] Obfuscation** A)  Final   Organism ID, Bacteria ESCHERICHIA COLI (A)  Final      Susceptibility   Escherichia coli - MIC*    AMPICILLIN 8 SENSITIVE Sensitive     CEFAZOLIN <=4 SENSITIVE Sensitive     CEFTRIAXONE <=1 SENSITIVE Sensitive     CIPROFLOXACIN <=0.25 SENSITIVE Sensitive     GENTAMICIN <=1 SENSITIVE Sensitive     IMIPENEM <=0.25 SENSITIVE Sensitive     NITROFURANTOIN <=16 SENSITIVE Sensitive     TRIMETH/SULFA <=20 SENSITIVE Sensitive     AMPICILLIN/SULBACTAM 4 SENSITIVE Sensitive     PIP/TAZO <=4 SENSITIVE Sensitive     Extended ESBL NEGATIVE Sensitive     * 40,000 COLONIES/mL ESCHERICHIA COLI   Enterococcus faecalis - MIC*    AMPICILLIN <=2 SENSITIVE Sensitive     LEVOFLOXACIN 1 SENSITIVE Sensitive     NITROFURANTOIN <=16 SENSITIVE Sensitive     VANCOMYCIN 1 SENSITIVE Sensitive     * >=100,000 COLONIES/mL ENTEROCOCCUS FAECALIS  Anaerobic culture     Status: None (Preliminary result)   Collection Time: 12/18/16  5:18 PM  Result Value Ref Range Status   Specimen Description CSF TUBE 2  Final   Special Requests NONE  Final   Culture   Final    NO GROWTH 3 DAYS NO ANAEROBES ISOLATED; CULTURE IN PROGRESS FOR 5 DAYS   Report Status PENDING  Incomplete  CSF culture     Status: None  (Preliminary result)   Collection Time: 12/18/16  5:18 PM  Result Value Ref Range Status   Specimen Description CSF TUBE 2  Final   Special Requests NONE  Final   Gram Stain   Final    ABUNDANT WBC PRESENT, PREDOMINANTLY PMN NO ORGANISMS SEEN    Culture NO GROWTH 3 DAYS  Final   Report Status PENDING  Incomplete  Culture, fungus without smear     Status: None (Preliminary result)   Collection Time: 12/18/16  5:18 PM  Result Value Ref Range Status   Specimen Description CSF TUBE 2  Final   Special Requests NONE  Final   Culture NO GROWTH 3 DAYS  Final   Report Status PENDING  Incomplete         Radiology Studies: No results found.      Scheduled Meds: . dexamethasone  4 mg Intravenous Q8H  . diazepam  2 mg Oral BID   Continuous Infusions: . sodium chloride 75 mL/hr at 12/20/16 1218  . ceFEPime (MAXIPIME) IV 2 g (12/21/16 1323)  . vancomycin Stopped (12/21/16 1238)     LOS: 3 days    Time spent: 35 minutes .    Waldron Session, MD [facility]   If 7PM-7AM, please contact night-coverage [email or web address] Password [email or web address] 12/21/2016, 2:23 PM

## 2016-12-22 LAB — COMPREHENSIVE METABOLIC PANEL
ALT: 25 U/L (ref 17–63)
AST: 11 U/L — ABNORMAL LOW (ref 15–41)
Albumin: 3 g/dL — ABNORMAL LOW (ref 3.5–5.0)
Alkaline Phosphatase: 29 U/L — ABNORMAL LOW (ref 38–126)
Anion gap: 8 (ref 5–15)
BUN: 12 mg/dL (ref 6–20)
CALCIUM: 8.7 mg/dL — AB (ref 8.9–10.3)
CO2: 22 mmol/L (ref 22–32)
CREATININE: 0.61 mg/dL (ref 0.61–1.24)
Chloride: 107 mmol/L (ref 101–111)
Glucose, Bld: 109 mg/dL — ABNORMAL HIGH (ref 65–99)
Potassium: 3.6 mmol/L (ref 3.5–5.1)
Sodium: 137 mmol/L (ref 135–145)
Total Bilirubin: 0.5 mg/dL (ref 0.3–1.2)
Total Protein: 5.5 g/dL — ABNORMAL LOW (ref 6.5–8.1)

## 2016-12-22 LAB — CBC
HCT: 31.8 % — ABNORMAL LOW (ref 39.0–52.0)
Hemoglobin: 10.6 g/dL — ABNORMAL LOW (ref 13.0–17.0)
MCH: 30.1 pg (ref 26.0–34.0)
MCHC: 33.3 g/dL (ref 30.0–36.0)
MCV: 90.3 fL (ref 78.0–100.0)
PLATELETS: 330 10*3/uL (ref 150–400)
RBC: 3.52 MIL/uL — ABNORMAL LOW (ref 4.22–5.81)
RDW: 13.8 % (ref 11.5–15.5)
WBC: 14.2 10*3/uL — ABNORMAL HIGH (ref 4.0–10.5)

## 2016-12-22 LAB — CSF CULTURE W GRAM STAIN

## 2016-12-22 LAB — CSF CULTURE: CULTURE: NO GROWTH

## 2016-12-22 MED ORDER — DEXAMETHASONE SODIUM PHOSPHATE 4 MG/ML IJ SOLN
2.0000 mg | Freq: Three times a day (TID) | INTRAMUSCULAR | Status: DC
  Administered 2016-12-22 – 2016-12-23 (×3): 2 mg via INTRAVENOUS
  Filled 2016-12-22 (×3): qty 1

## 2016-12-22 NOTE — Progress Notes (Signed)
**Note De-Identified [place] Obfuscation** At 0332 this morning, pt called to nurse's station stating his IV was swollen and burning. Went into pt's room and pt's rt hand that had vancomycin infusing is very swollen. D/c IV site and applied heating pack x2 and ice pack to right hand. Will continue to monitor pt. Ranelle Oyster, RN

## 2016-12-22 NOTE — Progress Notes (Signed)
**Note De-identified [place] Obfuscation**      **Note De-Identified [place] Obfuscation** INFECTIOUS DISEASE ATTENDING ADDENDUM:   Date: 12/22/2016  Patient name: Derek Donaldson  Medical record number: [id number]  Date of birth: 11/20/1970   CSF cultures NGTD at 3 days final and one still being incubated for anerobes  I AM STOPPING IV ANTIBIOTICS  I WILL SIGN OFF FOR NOW  PLEASE CALL us BACK IF EITHER HE GROWS AN ORGANISM OR WORSENS OFF ABX.  Rhina Brackett Dam 12/22/2016, 1:25 PM

## 2016-12-22 NOTE — Progress Notes (Signed)
**Note De-Identified [place] Obfuscation** Notified Schorr, NP that pt's HR is the 40's on telemetry. Pt is asymptomatic and sleeping. Will continue to monitor pt. Ranelle Oyster, RN

## 2016-12-22 NOTE — Progress Notes (Signed)
**Note De-Identified [name]** PROGRESS NOTE    Derek Donaldson   ZHG:[id number]  DOB: 1971-05-04  DOA: 12/17/2016 PCP: Dione Housekeeper, MD   Brief Narrative:  Derek Donaldson is a 46 y.o.malewith medical history significant ofHTN, asthma, and recent suboccipital craniectomy withduraplasty by Dr. Cyndy Freeze on 7/26, due to incomplete Charidecompression that had previously treated with AV fistula. Patient was discharged home on 12/11/16 presented with worsening chills back pain neck pain and spasms with low-grade fevers.   Subjective: Mild neck stiffness, no headaches ROS: no complaints of nausea, vomiting, constipation diarrhea, cough, dyspnea or dysuria. No other complaints.   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 Cyndy Freeze on 7/26, due to incomplete Charidecompression with AV fistula  - Neurosurgery is following      Mild intermittent asthma - stable   DVT prophylaxis: SCDs Code Status: Full code Family Communication: wife and mother Disposition Plan:  Consultants:   NS  ID Procedures:   LP Antimicrobials:  Anti-infectives    Start     Dose/Rate Route Frequency Ordered Stop   12/21/16 1700  vancomycin (VANCOCIN) 1,500 mg in sodium chloride 0.9 % 500 mL IVPB  Status:  Discontinued     1,500 mg 250 mL/hr over 120 Minutes Intravenous Every 8 hours 12/20/16 1626 12/20/16 1639   12/20/16 1800  vancomycin (VANCOCIN) 1,500 mg in sodium chloride 0.9 % 500 mL IVPB     1,500 mg 250 mL/hr over 120 Minutes Intravenous Every 8 hours 12/20/16 1639     12/19/16 0830  ceFEPIme (MAXIPIME) 2 g in dextrose 5 % 50 mL IVPB     2 g 100 mL/hr over 30 Minutes Intravenous  Every 8 hours 12/19/16 0817     12/19/16 0000  vancomycin (VANCOCIN) 1,250 mg in sodium chloride 0.9 % 250 mL IVPB  Status:  Discontinued     1,250 mg 166.7 mL/hr over 90 Minutes Intravenous Every 8 hours 12/18/16 1514 12/20/16 1626   12/18/16 2200  ampicillin (OMNIPEN) 2 g in sodium chloride 0.9 % 50 mL IVPB  Status:  Discontinued     2 g 150 mL/hr over 20 Minutes Intravenous Every 4 hours 12/18/16 2124 12/19/16 1432   12/18/16 2200  cefTRIAXone (ROCEPHIN) 2 g in dextrose 5 % 50 mL IVPB  Status:  Discontinued     2 g 100 mL/hr over 30 Minutes Intravenous Every 12 hours 12/18/16 2125 12/19/16 0817   12/18/16 2130  cefTRIAXone (ROCEPHIN) 2 g in dextrose 5 % 50 mL IVPB  Status:  Discontinued     2 g 100 mL/hr over 30 Minutes Intravenous Every 24 hours 12/18/16 2115 12/18/16 2125   12/18/16 1800  piperacillin-tazobactam (ZOSYN) IVPB 3.375 g  Status:  Discontinued     3.375 g 100 mL/hr over 30 Minutes Intravenous Every 6 hours 12/18/16 1450 12/18/16 1500   12/18/16 1600  piperacillin-tazobactam (ZOSYN) IVPB 3.375 g  Status:  Discontinued     3.375 g 12.5 mL/hr over 240 Minutes Intravenous Every 8 hours 12/18/16 1501 12/18/16 2115   12/18/16 1600  vancomycin (VANCOCIN) 2,500 mg in sodium chloride 0.9 % 500 mL IVPB     2,500 mg 250 mL/hr over 120 **Note De-Identified [place] Obfuscation** Minutes Intravenous  Once 12/18/16 1510 12/18/16 2004   12/18/16 1500  vancomycin (VANCOCIN) IVPB 1000 mg/200 mL premix  Status:  Discontinued     1,000 mg 200 mL/hr over 60 Minutes Intravenous Every 24 hours 12/18/16 1450 12/18/16 1510   12/18/16 0845  piperacillin-tazobactam (ZOSYN) IVPB 3.375 g  Status:  Discontinued     3.375 g 12.5 mL/hr over 240 Minutes Intravenous Every 6 hours 12/18/16 0841 12/18/16 0849       Objective: Vitals:   12/21/16 1324 12/21/16 2002 12/22/16 0035 12/22/16 0441  BP: (!) 161/93 (!) 158/90 (!) 152/92 (!) 153/93  Pulse: (!) 57 65 (!) 47 (!) 47  Resp: 18 18 18 18   Temp: 98.1 F (36.7 C) 98.4 F (36.9 C) 98.1 F  (36.7 C) 97.7 F (36.5 C)  TempSrc: Oral Oral Oral Oral  SpO2: 95% 97% 97% 98%  Weight:      Height:        Intake/Output Summary (Last 24 hours) at 12/22/16 1216 Last data filed at 12/22/16 0300  Gross per 24 hour  Intake          1968.75 ml  Output                0 ml  Net          1968.75 ml   Filed Weights   12/17/16 1926 12/18/16 0544  Weight: 133.8 kg (295 lb) 132.2 kg (291 lb 8 oz)    Examination: General exam: Appears comfortable  HEENT: PERRLA, oral mucosa moist, no sclera icterus or thrush Respiratory system: Clear to auscultation. Respiratory effort normal. Cardiovascular system: S1 & S2 heard, RRR.  No murmurs  Gastrointestinal system: Abdomen soft, non-tender, nondistended. Normal bowel sound. No organomegaly Central nervous system: Alert and oriented. No focal neurological deficits. Extremities: No cyanosis, clubbing or edema Skin: No rashes or ulcers Psychiatry:  Mood & affect appropriate.     Data Reviewed: I have personally reviewed following labs and imaging studies  CBC:  Recent Labs Lab 12/17/16 2108 12/18/16 0414 12/19/16 1502 12/20/16 0638 12/21/16 0513 12/22/16 0704  WBC 17.8* 18.8* 14.5* 14.1* 15.1* 14.2*  NEUTROABS 14.0* 17.6*  --   --   --   --   HGB 12.1* 11.6* 11.1* 11.2* 10.4* 10.6*  HCT 36.1* 34.2* 33.5* 34.1* 30.9* 31.8*  MCV 88.7 87.9 89.3 88.6 89.3 90.3  PLT 365 331 311 319 324 355   Basic Metabolic Panel:  Recent Labs Lab 12/17/16 2108 12/18/16 0414 12/20/16 0638 12/21/16 0513 12/22/16 0704  NA 132* 131* 138 138 137  K 3.4* 4.0 3.7 3.6 3.6  CL 99* 102 106 108 107  CO2 26 21* 22 23 22   GLUCOSE 109* 156* 138* 135* 109*  BUN 15 13 11 14 12   CREATININE 1.00 0.85 0.66 0.65 0.61  CALCIUM 9.2 8.8* 8.8* 8.6* 8.7*   GFR: Estimated Creatinine Clearance: 157.8 mL/min (by C-G formula based on SCr of 0.61 mg/dL). Liver Function Tests:  Recent Labs Lab 12/20/16 7322 12/21/16 0513 12/22/16 0704  AST 13* 13* 11*  ALT  36 29 25  ALKPHOS 34* 30* 29*  BILITOT 0.8 0.5 0.5  PROT 6.3* 5.9* 5.5*  ALBUMIN 3.1* 2.9* 3.0*   No results for input(s): LIPASE, AMYLASE in the last 168 hours. No results for input(s): AMMONIA in the last 168 hours. Coagulation Profile:  Recent Labs Lab 12/18/16 1758  INR 1.05   Cardiac Enzymes: No results for input(s): CKTOTAL, CKMB, CKMBINDEX, TROPONINI in the **Note De-Identified [place] Obfuscation** last 168 hours. BNP (last 3 results) No results for input(s): PROBNP in the last 8760 hours. HbA1C: No results for input(s): HGBA1C in the last 72 hours. CBG: No results for input(s): GLUCAP in the last 168 hours. Lipid Profile: No results for input(s): CHOL, HDL, LDLCALC, TRIG, CHOLHDL, LDLDIRECT in the last 72 hours. Thyroid Function Tests: No results for input(s): TSH, T4TOTAL, FREET4, T3FREE, THYROIDAB in the last 72 hours. Anemia Panel: No results for input(s): VITAMINB12, FOLATE, FERRITIN, TIBC, IRON, RETICCTPCT in the last 72 hours. Urine analysis:    Component Value Date/Time   COLORURINE YELLOW 12/18/2016 0214   APPEARANCEUR CLOUDY (A) 12/18/2016 0214   APPEARANCEUR Clear 08/13/2015 0000   LABSPEC 1.026 12/18/2016 0214   PHURINE 7.0 12/18/2016 0214   GLUCOSEU NEGATIVE 12/18/2016 0214   HGBUR NEGATIVE 12/18/2016 0214   BILIRUBINUR NEGATIVE 12/18/2016 0214   BILIRUBINUR Negative 08/13/2015 0000   KETONESUR NEGATIVE 12/18/2016 0214   PROTEINUR 30 (A) 12/18/2016 0214   UROBILINOGEN 0.2 07/16/2014 0935   NITRITE NEGATIVE 12/18/2016 0214   LEUKOCYTESUR TRACE (A) 12/18/2016 0214   LEUKOCYTESUR Negative 08/13/2015 0000   Sepsis Labs: @LABRCNTIP (procalcitonin:4,lacticidven:4) ) Recent Results (from the past 240 hour(s))  Blood culture (routine x 2)     Status: None (Preliminary result)   Collection Time: 12/18/16  1:50 AM  Result Value Ref Range Status   Specimen Description BLOOD RIGHT HAND  Final   Special Requests   Final    BOTTLES DRAWN AEROBIC AND ANAEROBIC Blood Culture adequate volume    Culture NO GROWTH 4 DAYS  Final   Report Status PENDING  Incomplete  Blood culture (routine x 2)     Status: None (Preliminary result)   Collection Time: 12/18/16  2:00 AM  Result Value Ref Range Status   Specimen Description BLOOD LEFT HAND  Final   Special Requests   Final    BOTTLES DRAWN AEROBIC AND ANAEROBIC Blood Culture adequate volume   Culture NO GROWTH 4 DAYS  Final   Report Status PENDING  Incomplete  Urine Culture     Status: Abnormal   Collection Time: 12/18/16  2:12 AM  Result Value Ref Range Status   Specimen Description URINE, RANDOM  Final   Special Requests NONE  Final   Culture (A)  Final    >=100,000 COLONIES/mL ENTEROCOCCUS FAECALIS 40,000 COLONIES/mL ESCHERICHIA COLI    Report Status 12/21/2016 FINAL  Final   Organism ID, Bacteria ENTEROCOCCUS FAECALIS (A)  Final   Organism ID, Bacteria ESCHERICHIA COLI (A)  Final      Susceptibility   Escherichia coli - MIC*    AMPICILLIN 8 SENSITIVE Sensitive     CEFAZOLIN <=4 SENSITIVE Sensitive     CEFTRIAXONE <=1 SENSITIVE Sensitive     CIPROFLOXACIN <=0.25 SENSITIVE Sensitive     GENTAMICIN <=1 SENSITIVE Sensitive     IMIPENEM <=0.25 SENSITIVE Sensitive     NITROFURANTOIN <=16 SENSITIVE Sensitive     TRIMETH/SULFA <=20 SENSITIVE Sensitive     AMPICILLIN/SULBACTAM 4 SENSITIVE Sensitive     PIP/TAZO <=4 SENSITIVE Sensitive     Extended ESBL NEGATIVE Sensitive     * 40,000 COLONIES/mL ESCHERICHIA COLI   Enterococcus faecalis - MIC*    AMPICILLIN <=2 SENSITIVE Sensitive     LEVOFLOXACIN 1 SENSITIVE Sensitive     NITROFURANTOIN <=16 SENSITIVE Sensitive     VANCOMYCIN 1 SENSITIVE Sensitive     * >=100,000 COLONIES/mL ENTEROCOCCUS FAECALIS  Anaerobic culture     Status: None (Preliminary result) **Note De-Identified [place] Obfuscation** Collection Time: 12/18/16  5:18 PM  Result Value Ref Range Status   Specimen Description CSF TUBE 2  Final   Special Requests NONE  Final   Culture   Final    NO GROWTH 4 DAYS NO ANAEROBES ISOLATED; CULTURE IN PROGRESS  FOR 5 DAYS   Report Status PENDING  Incomplete  CSF culture     Status: None   Collection Time: 12/18/16  5:18 PM  Result Value Ref Range Status   Specimen Description CSF TUBE 2  Final   Special Requests NONE  Final   Gram Stain   Final    ABUNDANT WBC PRESENT, PREDOMINANTLY PMN NO ORGANISMS SEEN    Culture NO GROWTH 3 DAYS  Final   Report Status 12/22/2016 FINAL  Final  Culture, fungus without smear     Status: None (Preliminary result)   Collection Time: 12/18/16  5:18 PM  Result Value Ref Range Status   Specimen Description CSF TUBE 2  Final   Special Requests NONE  Final   Culture NO GROWTH 3 DAYS  Final   Report Status PENDING  Incomplete         Radiology Studies: No results found.    Scheduled Meds: . dexamethasone  4 mg Intravenous Q8H  . diazepam  2 mg Oral BID   Continuous Infusions: . ceFEPime (MAXIPIME) IV Stopped (12/22/16 0942)  . vancomycin 1,500 mg (12/22/16 0935)     LOS: 4 days    Time spent in minutes: 35    Debbe Odea, MD [facility] Pager: [email or web address] Password [email or web address] 12/22/2016, 12:16 PM

## 2016-12-23 DIAGNOSIS — I10 Essential (primary) hypertension: Secondary | ICD-10-CM

## 2016-12-23 LAB — CULTURE, BLOOD (ROUTINE X 2)
Culture: NO GROWTH
Culture: NO GROWTH
SPECIAL REQUESTS: ADEQUATE
Special Requests: ADEQUATE

## 2016-12-23 LAB — ANAEROBIC CULTURE

## 2016-12-23 MED ORDER — DEXAMETHASONE 2 MG PO TABS: ORAL_TABLET | ORAL | 0 refills | Status: DC

## 2016-12-23 MED ORDER — HYDRALAZINE HCL 20 MG/ML IJ SOLN
5.0000 mg | Freq: Once | INTRAMUSCULAR | Status: AC
  Administered 2016-12-23: 5 mg via INTRAVENOUS
  Filled 2016-12-23: qty 1

## 2016-12-23 NOTE — Discharge Summary (Addendum)
**Note De-Identified [place] Obfuscation** Physician Discharge Summary  Derek Donaldson SEG:[id number] DOB: July 13, 1970 DOA: 12/17/2016  PCP: Dione Housekeeper, MD  Admit date: 12/17/2016 Discharge date: 12/23/2016  Admitted From: home  Disposition:  home    Discharge Condition:  stable   CODE STATUS:  Full code   Consultations:  NS  ID    Discharge Diagnoses:  Principal Problem:   Meningitis after procedure/  Chemical meningitis/   Leukocytosis Active Problems:   Chiari malformation type I ([place])   Mild intermittent asthma    Subjective: Pain is improving. He feels good about this. No new complaints.   Brief Summary: Derek Donaldson is a 46 y.o.malewith medical history significant ofHTN, asthma, and recent suboccipital craniectomy withduraplasty by Dr. Cyndy Freeze on 7/26, due to incomplete Charidecompression that had previously treated with AV fistula. Patient was discharged home on 12/11/16 presented with worsening chills back pain neck pain and spasms with low-grade fevers.   Hospital Course:  Principal Problem:   Meningitis after procedure- Leukocytosis - CSF>> 2.025 RBC, 9800 WBC, 94% neutro, T pro 138, glucose 44 - infectious vs chemical - currently on antibiotics per ID - Steroids being weaned off - symptoms of pain in neck and headache improving - cultures negative and ID has decided to stop antibiotics on 8/8- ok to d/c patient home - will continue weaning steroids rather than stop as it may have helped to decrease his pain.  - surgery will remove sutures and staples today  Active Problems:  Pyuria  - asymptomatic- hold off on treating   Chiarimalformation type I: - underwent suboccipital craniectomy withduraplasty by Dr. Cyndy Freeze on 7/26, due to incomplete Charidecompression with AV fistula  - Neurosurgery is following      Mild intermittent asthma - stable  HTN - Norvasc, Metoprolol, valsartan/HCTZ  Discharge Instructions  Discharge Instructions    Diet - low sodium heart healthy    Complete by:  As  directed    Increase activity slowly    Complete by:  As directed      Allergies as of 12/23/2016      Reactions   Contrast Media [iodinated Diagnostic Agents] Hives, Swelling   Lip swelling - reaction after contrast dye given in the morning and shrimp eaten at lunch - unable to determine which caused the reaction - 2015 - pt has had contrast dye since then with pre-medication   Shellfish Allergy Hives, Swelling   Lip swelling - reaction after contrast dye given in the morning and shrimp eaten at lunch - unable to determine which caused the reaction - 2015      Medication List    TAKE these medications   acetaminophen 500 MG tablet Commonly known as:  TYLENOL Take 1,000 mg by mouth every 6 (six) hours as needed for headache (pain).   albuterol 108 (90 Base) MCG/ACT inhaler Commonly known as:  PROVENTIL HFA;VENTOLIN HFA Inhale 2 puffs into the lungs every 4 (four) hours as needed for wheezing or shortness of breath.   amLODipine 10 MG tablet Commonly known as:  NORVASC Take 10 mg by mouth daily before breakfast.   ANTARA 130 MG capsule Generic drug:  fenofibrate micronized Take 130 mg by mouth daily before breakfast.   aspirin EC 81 MG tablet Take 81 mg by mouth daily.   dexamethasone 2 MG tablet Commonly known as:  DECADRON Take 1 pill 3 times a day for 1 day, then 1 pill 2 times a day for 1 day, then 1 pill in the morning for 1 **Note De-Identified [place] Obfuscation** day, then 1/2 pill in Am for 2 days and then stop   EPINEPHrine 0.3 mg/0.3 mL Soaj injection Commonly known as:  EPI-PEN Inject 0.3 mg into the muscle once as needed (severe allergic reaction).   HYDROcodone-acetaminophen 5-325 MG tablet Commonly known as:  NORCO/VICODIN Take 1 tablet by mouth every 6 (six) hours as needed for moderate pain.   ibuprofen 200 MG tablet Commonly known as:  ADVIL,MOTRIN Take 400 mg by mouth every 6 (six) hours as needed for mild pain or moderate pain.   metoprolol succinate 25 MG 24 hr tablet Commonly known as:   TOPROL-XL Take 25 mg by mouth daily before breakfast.   potassium chloride SA 20 MEQ tablet Commonly known as:  K-DUR,KLOR-CON Take 1 tablet (20 mEq total) by mouth daily.   tiZANidine 4 MG tablet Commonly known as:  ZANAFLEX Take 1 tablet (4 mg total) by mouth every 6 (six) hours as needed for muscle spasms.   valsartan-hydrochlorothiazide 320-25 MG tablet Commonly known as:  DIOVAN-HCT Take 1 tablet by mouth daily before breakfast.      Follow-up Information    Tommy Medal, Lavell Islam, MD Follow up.   Specialty:  Infectious Diseases Why:  call if symptoms recur Contact information: [address] [address] [phone]        Ashok Pall, MD Follow up.   Specialty:  Neurosurgery Why:  call for follow up Contact information: [address] [place] [address] [phone]          Allergies  Allergen Reactions  . Contrast Media [Iodinated Diagnostic Agents] Hives and Swelling    Lip swelling - reaction after contrast dye given in the morning and shrimp eaten at lunch - unable to determine which caused the reaction - 2015 - pt has had contrast dye since then with pre-medication  . Shellfish Allergy Hives and Swelling    Lip swelling - reaction after contrast dye given in the morning and shrimp eaten at lunch - unable to determine which caused the reaction - 2015      Procedures/Studies:  LP  Dg Chest 2 View  Result Date: 12/18/2016 CLINICAL DATA:  46 year old male with shortness of breath EXAM: CHEST  2 VIEW COMPARISON:  Prior chest x-ray 10/20/2015 FINDINGS: Cardiac and mediastinal contours are within normal limits. Mild streaky airspace opacities in both lower lobes. Trace bronchitic changes are similar compared to prior. No pneumothorax, pleural effusion or focal airspace consolidation. No acute osseous abnormality. IMPRESSION: Mild streaky airspace opacities in both lung bases favored to reflect subsegmental atelectasis.  Otherwise, no acute cardiopulmonary process. Electronically Signed   By: Jacqulynn Cadet M.D.   On: 12/18/2016 09:13   Ct Head Wo Contrast  Result Date: 12/17/2016 CLINICAL DATA:  Initial evaluation for acute severe headache, worse headache of life. EXAM: CT HEAD WITHOUT CONTRAST TECHNIQUE: Contiguous axial images were obtained from the base of the skull through the vertex without intravenous contrast. COMPARISON:  Prior MRI from 11/29/2016. 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 Additionally, patient is status post suboccipital decompressive craniectomy for decompression of Chiari 1 malformation. Evaluation of the basilar cisterns and posterior fossa as well as the left cerebral hemisphere limited by streak artifact. No evidence for acute intracranial hemorrhage. No acute large vessel territory infarct. No mass lesion, midline shift, or mass effect. No hydrocephalus. No definite extra-axial fluid collection. Vascular: Intracranial vasculature poorly evaluated due to extensive streak artifact. **Note De-Identified [place] Obfuscation** Skull: Embolization material present at the age left temporal and occipital calvarium, extending into the extracranial soft tissues. Skin staple in place at the right frontotemporal scalp. Small collection measuring approximately 5.0 x 3.3 cm present at the Chiari 1 decompression site (series 3, image 8), most likely postoperative seroma. Sinuses/Orbits: Globes and orbital soft tissues within normal limits. Mucosal thickening present within the inferior maxillary sinuses. Paranasal sinuses are otherwise clear. Mastoid air cells grossly clear. IMPRESSION: 1. 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 2. No other definite acute intracranial process identified. Electronically Signed   By: Jeannine Boga M.D.   On: 12/17/2016 21:50    Dg Fluoro Guide Lumbar Puncture  Addendum Date: 12/18/2016   ADDENDUM REPORT: 12/18/2016 18:13 ADDENDUM: Laboratory staff confirms that 10 cc of CSF was delivered. Electronically Signed   By: Franki Cabot M.D.   On: 12/18/2016 18:13   Result Date: 12/18/2016 CLINICAL DATA:  Meningitis after procedure. EXAM: DIAGNOSTIC LUMBAR PUNCTURE UNDER FLUOROSCOPIC GUIDANCE FLUOROSCOPY TIME:  Fluoroscopy Time:  24 seconds Number of Acquired Spot Images: 1 PROCEDURE: Informed consent was obtained from the patient prior to the procedure, including potential complications of headache, allergy, and pain. With the patient prone, the lower back was prepped with Betadine. 1% Lidocaine was used for local anesthesia. Lumbar puncture was performed at the L4-5 level using a 20 gauge needle with return of cloudy CSF. Opening pressure was not obtained due to inability to moved the patient to a lateral decubitus positioning. 11 ml of CSF were obtained for laboratory studies. The patient tolerated the procedure well and there were no apparent complications. IMPRESSION: Successful fluoroscopic guided lumbar puncture with 11 cc of cloudy CSF removed and sent to the laboratory. Electronically Signed: By: Franki Cabot M.D. On: 12/18/2016 17:54        Discharge Exam: Vitals:   12/23/16 0509 12/23/16 0649  BP: (!) 170/94 (!) 148/84  Pulse: (!) 58 (!) 48  Resp: 18   Temp: 97.8 F (36.6 C)    Vitals:   12/22/16 2126 12/22/16 2128 12/23/16 0509 12/23/16 0649  BP: (!) 161/95 (!) 161/94 (!) 170/94 (!) 148/84  Pulse: (!) 51 (!) 49 (!) 58 (!) 48  Resp: 16  18   Temp: 97.7 F (36.5 C)  97.8 F (36.6 C)   TempSrc: Oral  Oral   SpO2: 96%  98%   Weight:      Height:        General: Pt is alert, awake, not in acute distress Cardiovascular: RRR, S1/S2 +, no rubs, no gallops Respiratory: CTA bilaterally, no wheezing, no rhonchi Abdominal: Soft, NT, ND, bowel sounds + Extremities: no edema, no cyanosis    The results of  significant diagnostics from this hospitalization (including imaging, microbiology, ancillary and laboratory) are listed below for reference.     Microbiology: Recent Results (from the past 240 hour(s))  Blood culture (routine x 2)     Status: None (Preliminary result)   Collection Time: 12/18/16  1:50 AM  Result Value Ref Range Status   Specimen Description BLOOD RIGHT HAND  Final   Special Requests   Final    BOTTLES DRAWN AEROBIC AND ANAEROBIC Blood Culture adequate volume   Culture NO GROWTH 4 DAYS  Final   Report Status PENDING  Incomplete  Blood culture (routine x 2)     Status: None (Preliminary result)   Collection Time: 12/18/16  2:00 **Note De-Identified [place] Obfuscation** AM  Result Value Ref Range Status   Specimen Description BLOOD LEFT HAND  Final   Special Requests   Final    BOTTLES DRAWN AEROBIC AND ANAEROBIC Blood Culture adequate volume   Culture NO GROWTH 4 DAYS  Final   Report Status PENDING  Incomplete  Urine Culture     Status: Abnormal   Collection Time: 12/18/16  2:12 AM  Result Value Ref Range Status   Specimen Description URINE, RANDOM  Final   Special Requests NONE  Final   Culture (A)  Final    >=100,000 COLONIES/mL ENTEROCOCCUS FAECALIS 40,000 COLONIES/mL ESCHERICHIA COLI    Report Status 12/21/2016 FINAL  Final   Organism ID, Bacteria ENTEROCOCCUS FAECALIS (A)  Final   Organism ID, Bacteria ESCHERICHIA COLI (A)  Final      Susceptibility   Escherichia coli - MIC*    AMPICILLIN 8 SENSITIVE Sensitive     CEFAZOLIN <=4 SENSITIVE Sensitive     CEFTRIAXONE <=1 SENSITIVE Sensitive     CIPROFLOXACIN <=0.25 SENSITIVE Sensitive     GENTAMICIN <=1 SENSITIVE Sensitive     IMIPENEM <=0.25 SENSITIVE Sensitive     NITROFURANTOIN <=16 SENSITIVE Sensitive     TRIMETH/SULFA <=20 SENSITIVE Sensitive     AMPICILLIN/SULBACTAM 4 SENSITIVE Sensitive     PIP/TAZO <=4 SENSITIVE Sensitive     Extended ESBL NEGATIVE Sensitive     * 40,000 COLONIES/mL ESCHERICHIA COLI   Enterococcus faecalis - MIC*     AMPICILLIN <=2 SENSITIVE Sensitive     LEVOFLOXACIN 1 SENSITIVE Sensitive     NITROFURANTOIN <=16 SENSITIVE Sensitive     VANCOMYCIN 1 SENSITIVE Sensitive     * >=100,000 COLONIES/mL ENTEROCOCCUS FAECALIS  Anaerobic culture     Status: None (Preliminary result)   Collection Time: 12/18/16  5:18 PM  Result Value Ref Range Status   Specimen Description CSF TUBE 2  Final   Special Requests NONE  Final   Culture   Final    NO GROWTH 4 DAYS NO ANAEROBES ISOLATED; CULTURE IN PROGRESS FOR 5 DAYS   Report Status PENDING  Incomplete  CSF culture     Status: None   Collection Time: 12/18/16  5:18 PM  Result Value Ref Range Status   Specimen Description CSF TUBE 2  Final   Special Requests NONE  Final   Gram Stain   Final    ABUNDANT WBC PRESENT, PREDOMINANTLY PMN NO ORGANISMS SEEN    Culture NO GROWTH 3 DAYS  Final   Report Status 12/22/2016 FINAL  Final  Culture, fungus without smear     Status: None (Preliminary result)   Collection Time: 12/18/16  5:18 PM  Result Value Ref Range Status   Specimen Description CSF TUBE 2  Final   Special Requests NONE  Final   Culture NO GROWTH 3 DAYS  Final   Report Status PENDING  Incomplete     Labs: BNP (last 3 results) No results for input(s): BNP in the last 8760 hours. Basic Metabolic Panel:  Recent Labs Lab 12/17/16 2108 12/18/16 0414 12/20/16 0638 12/21/16 0513 12/22/16 0704  NA 132* 131* 138 138 137  K 3.4* 4.0 3.7 3.6 3.6  CL 99* 102 106 108 107  CO2 26 21* 22 23 22   GLUCOSE 109* 156* 138* 135* 109*  BUN 15 13 11 14 12   CREATININE 1.00 0.85 0.66 0.65 0.61  CALCIUM 9.2 8.8* 8.8* 8.6* 8.7*   Liver Function Tests:  Recent Labs Lab 12/20/16 6144 12/21/16 0513 12/22/16 **Note De-Identified [place] Obfuscation** 0704  AST 13* 13* 11*  ALT 36 29 25  ALKPHOS 34* 30* 29*  BILITOT 0.8 0.5 0.5  PROT 6.3* 5.9* 5.5*  ALBUMIN 3.1* 2.9* 3.0*   No results for input(s): LIPASE, AMYLASE in the last 168 hours. No results for input(s): AMMONIA in the last 168  hours. CBC:  Recent Labs Lab 12/17/16 2108 12/18/16 0414 12/19/16 1502 12/20/16 0638 12/21/16 0513 12/22/16 0704  WBC 17.8* 18.8* 14.5* 14.1* 15.1* 14.2*  NEUTROABS 14.0* 17.6*  --   --   --   --   HGB 12.1* 11.6* 11.1* 11.2* 10.4* 10.6*  HCT 36.1* 34.2* 33.5* 34.1* 30.9* 31.8*  MCV 88.7 87.9 89.3 88.6 89.3 90.3  PLT 365 331 311 319 324 330   Cardiac Enzymes: No results for input(s): CKTOTAL, CKMB, CKMBINDEX, TROPONINI in the last 168 hours. BNP: Invalid input(s): POCBNP CBG: No results for input(s): GLUCAP in the last 168 hours. D-Dimer No results for input(s): DDIMER in the last 72 hours. Hgb A1c No results for input(s): HGBA1C in the last 72 hours. Lipid Profile No results for input(s): CHOL, HDL, LDLCALC, TRIG, CHOLHDL, LDLDIRECT in the last 72 hours. Thyroid function studies No results for input(s): TSH, T4TOTAL, T3FREE, THYROIDAB in the last 72 hours.  Invalid input(s): FREET3 Anemia work up No results for input(s): VITAMINB12, FOLATE, FERRITIN, TIBC, IRON, RETICCTPCT in the last 72 hours. Urinalysis    Component Value Date/Time   COLORURINE YELLOW 12/18/2016 0214   APPEARANCEUR CLOUDY (A) 12/18/2016 0214   APPEARANCEUR Clear 08/13/2015 0000   LABSPEC 1.026 12/18/2016 0214   PHURINE 7.0 12/18/2016 0214   GLUCOSEU NEGATIVE 12/18/2016 0214   HGBUR NEGATIVE 12/18/2016 0214   BILIRUBINUR NEGATIVE 12/18/2016 0214   BILIRUBINUR Negative 08/13/2015 0000   KETONESUR NEGATIVE 12/18/2016 0214   PROTEINUR 30 (A) 12/18/2016 0214   UROBILINOGEN 0.2 07/16/2014 0935   NITRITE NEGATIVE 12/18/2016 0214   LEUKOCYTESUR TRACE (A) 12/18/2016 0214   LEUKOCYTESUR Negative 08/13/2015 0000   Sepsis Labs Invalid input(s): PROCALCITONIN,  WBC,  LACTICIDVEN Microbiology Recent Results (from the past 240 hour(s))  Blood culture (routine x 2)     Status: None (Preliminary result)   Collection Time: 12/18/16  1:50 AM  Result Value Ref Range Status   Specimen Description BLOOD  RIGHT HAND  Final   Special Requests   Final    BOTTLES DRAWN AEROBIC AND ANAEROBIC Blood Culture adequate volume   Culture NO GROWTH 4 DAYS  Final   Report Status PENDING  Incomplete  Blood culture (routine x 2)     Status: None (Preliminary result)   Collection Time: 12/18/16  2:00 AM  Result Value Ref Range Status   Specimen Description BLOOD LEFT HAND  Final   Special Requests   Final    BOTTLES DRAWN AEROBIC AND ANAEROBIC Blood Culture adequate volume   Culture NO GROWTH 4 DAYS  Final   Report Status PENDING  Incomplete  Urine Culture     Status: Abnormal   Collection Time: 12/18/16  2:12 AM  Result Value Ref Range Status   Specimen Description URINE, RANDOM  Final   Special Requests NONE  Final   Culture (A)  Final    >=100,000 COLONIES/mL ENTEROCOCCUS FAECALIS 40,000 COLONIES/mL ESCHERICHIA COLI    Report Status 12/21/2016 FINAL  Final   Organism ID, Bacteria ENTEROCOCCUS FAECALIS (A)  Final   Organism ID, Bacteria ESCHERICHIA COLI (A)  Final      Susceptibility   Escherichia coli - MIC* **Note De-Identified [place] Obfuscation** AMPICILLIN 8 SENSITIVE Sensitive     CEFAZOLIN <=4 SENSITIVE Sensitive     CEFTRIAXONE <=1 SENSITIVE Sensitive     CIPROFLOXACIN <=0.25 SENSITIVE Sensitive     GENTAMICIN <=1 SENSITIVE Sensitive     IMIPENEM <=0.25 SENSITIVE Sensitive     NITROFURANTOIN <=16 SENSITIVE Sensitive     TRIMETH/SULFA <=20 SENSITIVE Sensitive     AMPICILLIN/SULBACTAM 4 SENSITIVE Sensitive     PIP/TAZO <=4 SENSITIVE Sensitive     Extended ESBL NEGATIVE Sensitive     * 40,000 COLONIES/mL ESCHERICHIA COLI   Enterococcus faecalis - MIC*    AMPICILLIN <=2 SENSITIVE Sensitive     LEVOFLOXACIN 1 SENSITIVE Sensitive     NITROFURANTOIN <=16 SENSITIVE Sensitive     VANCOMYCIN 1 SENSITIVE Sensitive     * >=100,000 COLONIES/mL ENTEROCOCCUS FAECALIS  Anaerobic culture     Status: None (Preliminary result)   Collection Time: 12/18/16  5:18 PM  Result Value Ref Range Status   Specimen Description CSF TUBE 2   Final   Special Requests NONE  Final   Culture   Final    NO GROWTH 4 DAYS NO ANAEROBES ISOLATED; CULTURE IN PROGRESS FOR 5 DAYS   Report Status PENDING  Incomplete  CSF culture     Status: None   Collection Time: 12/18/16  5:18 PM  Result Value Ref Range Status   Specimen Description CSF TUBE 2  Final   Special Requests NONE  Final   Gram Stain   Final    ABUNDANT WBC PRESENT, PREDOMINANTLY PMN NO ORGANISMS SEEN    Culture NO GROWTH 3 DAYS  Final   Report Status 12/22/2016 FINAL  Final  Culture, fungus without smear     Status: None (Preliminary result)   Collection Time: 12/18/16  5:18 PM  Result Value Ref Range Status   Specimen Description CSF TUBE 2  Final   Special Requests NONE  Final   Culture NO GROWTH 3 DAYS  Final   Report Status PENDING  Incomplete     Time coordinating discharge: Over 30 minutes  SIGNED:   Debbe Odea, MD  [facility] 12/23/2016, 8:33 AM Pager   If 7PM-7AM, please contact night-coverage [email or web address] Password [email or web address]

## 2016-12-23 NOTE — Progress Notes (Signed)
**Note De-Identified [place] Obfuscation** Nsg Discharge Note  Admit Date:  12/17/2016 Discharge date: 12/23/2016   Benjamyn D Hann to be D/C'd Home per MD order.  AVS completed.  Copy for chart, and copy for patient signed, and dated. Patient/caregiver able to verbalize understanding.  Discharge Medication: Allergies as of 12/23/2016      Reactions   Contrast Media [iodinated Diagnostic Agents] Hives, Swelling   Lip swelling - reaction after contrast dye given in the morning and shrimp eaten at lunch - unable to determine which caused the reaction - 2015 - pt has had contrast dye since then with pre-medication   Shellfish Allergy Hives, Swelling   Lip swelling - reaction after contrast dye given in the morning and shrimp eaten at lunch - unable to determine which caused the reaction - 2015      Medication List    TAKE these medications   acetaminophen 500 MG tablet Commonly known as:  TYLENOL Take 1,000 mg by mouth every 6 (six) hours as needed for headache (pain).   albuterol 108 (90 Base) MCG/ACT inhaler Commonly known as:  PROVENTIL HFA;VENTOLIN HFA Inhale 2 puffs into the lungs every 4 (four) hours as needed for wheezing or shortness of breath.   amLODipine 10 MG tablet Commonly known as:  NORVASC Take 10 mg by mouth daily before breakfast.   ANTARA 130 MG capsule Generic drug:  fenofibrate micronized Take 130 mg by mouth daily before breakfast.   aspirin EC 81 MG tablet Take 81 mg by mouth daily.   dexamethasone 2 MG tablet Commonly known as:  DECADRON Take 1 pill 3 times a day for 1 day, then 1 pill 2 times a day for 1 day, then 1 pill in the morning for 1 day, then 1/2 pill in Am for 2 days and then stop   EPINEPHrine 0.3 mg/0.3 mL Soaj injection Commonly known as:  EPI-PEN Inject 0.3 mg into the muscle once as needed (severe allergic reaction).   HYDROcodone-acetaminophen 5-325 MG tablet Commonly known as:  NORCO/VICODIN Take 1 tablet by mouth every 6 (six) hours as needed for moderate pain.   ibuprofen 200  MG tablet Commonly known as:  ADVIL,MOTRIN Take 400 mg by mouth every 6 (six) hours as needed for mild pain or moderate pain.   metoprolol succinate 25 MG 24 hr tablet Commonly known as:  TOPROL-XL Take 25 mg by mouth daily before breakfast.   potassium chloride SA 20 MEQ tablet Commonly known as:  K-DUR,KLOR-CON Take 1 tablet (20 mEq total) by mouth daily.   tiZANidine 4 MG tablet Commonly known as:  ZANAFLEX Take 1 tablet (4 mg total) by mouth every 6 (six) hours as needed for muscle spasms.   valsartan-hydrochlorothiazide 320-25 MG tablet Commonly known as:  DIOVAN-HCT Take 1 tablet by mouth daily before breakfast.       Discharge Assessment: Vitals:   12/23/16 0509 12/23/16 0649  BP: (!) 170/94 (!) 148/84  Pulse: (!) 58 (!) 48  Resp: 18   Temp: 97.8 F (36.6 C)   SpO2: 98%    Skin clean, dry and intact without evidence of skin break down, no evidence of skin tears noted. Has healing incision on back of head.  IV catheter discontinued intact. Site without signs and symptoms of complications - no redness or edema noted at insertion site, patient denies c/o pain - only slight tenderness at site.  Dressing with slight pressure applied.  D/c Instructions-Education: Discharge instructions given to patient/family with verbalized understanding. D/c education completed with patient/family **Note De-Identified [place] Obfuscation** including follow up instructions, medication list, d/c activities limitations if indicated, with other d/c instructions as indicated by MD - patient able to verbalize understanding, all questions fully answered. Patient instructed to return to ED, call 911, or call MD for any changes in condition.  Patient escorted [place], and D/C home [place] private auto.  Jayveion Stalling Margaretha Sheffield, RN 12/23/2016 10:56 AM

## 2016-12-23 NOTE — Progress Notes (Signed)
**Note De-Identified [place] Obfuscation** Patient ID: Derek Donaldson, male   DOB: 1970/06/16, 46 y.o.   MRN: [id number] Subjective:  The patient is alert and pleasant. He looks and feels better. He wants to go home.  Objective: Vital signs in last 24 hours: Temp:  [97.7 F (36.5 C)-98.4 F (36.9 C)] 97.8 F (36.6 C) (08/09 0509) Pulse Rate:  [48-58] 48 (08/09 0649) Resp:  [16-18] 18 (08/09 0509) BP: (148-170)/(84-95) 148/84 (08/09 0649) SpO2:  [96 %-98 %] 98 % (08/09 0509)  Intake/Output from previous day: 08/08 0701 - 08/09 0700 In: 240 [P.O.:240] Out: -  Intake/Output this shift: No intake/output data recorded.  Physical exam the patient is alert and pleasant. His strength and speech is normal. His wound is healing well.  Lab Results:  Recent Labs  12/21/16 0513 12/22/16 0704  WBC 15.1* 14.2*  HGB 10.4* 10.6*  HCT 30.9* 31.8*  PLT 324 330   BMET  Recent Labs  12/21/16 0513 12/22/16 0704  NA 138 137  K 3.6 3.6  CL 108 107  CO2 23 22  GLUCOSE 135* 109*  BUN 14 12  CREATININE 0.65 0.61  CALCIUM 8.6* 8.7*    Studies/Results: No results found.  Assessment/Plan: Postop day #14: The patient is doing well. He can be discharged from my point of view and follow-up with Dr. Cyndy Freeze in a week or 2. We will remove his sutures and staples prior to discharge. I have answered all his questions.  LOS: 5 days     Pernella Ackerley D 12/23/2016, 11:43 AM

## 2017-01-08 LAB — CULTURE, FUNGUS WITHOUT SMEAR

## 2017-01-25 ENCOUNTER — Emergency Department (HOSPITAL_COMMUNITY): Payer: BLUE CROSS/BLUE SHIELD

## 2017-01-25 ENCOUNTER — Emergency Department (HOSPITAL_COMMUNITY)
Admission: EM | Admit: 2017-01-25 | Discharge: 2017-01-25 | Disposition: A | Discharge status: Home / Self Care | Payer: BLUE CROSS/BLUE SHIELD | Attending: Emergency Medicine | Admitting: Emergency Medicine

## 2017-01-25 ENCOUNTER — Encounter (HOSPITAL_COMMUNITY): Payer: Self-pay | Admitting: *Deleted

## 2017-01-25 DIAGNOSIS — Z7982 Long term (current) use of aspirin: Secondary | ICD-10-CM | POA: Diagnosis not present

## 2017-01-25 DIAGNOSIS — J45909 Unspecified asthma, uncomplicated: Secondary | ICD-10-CM | POA: Insufficient documentation

## 2017-01-25 DIAGNOSIS — N200 Calculus of kidney: Secondary | ICD-10-CM | POA: Insufficient documentation

## 2017-01-25 DIAGNOSIS — I10 Essential (primary) hypertension: Secondary | ICD-10-CM | POA: Diagnosis not present

## 2017-01-25 DIAGNOSIS — R110 Nausea: Secondary | ICD-10-CM | POA: Insufficient documentation

## 2017-01-25 DIAGNOSIS — R5383 Other fatigue: Secondary | ICD-10-CM | POA: Insufficient documentation

## 2017-01-25 DIAGNOSIS — R1031 Right lower quadrant pain: Secondary | ICD-10-CM | POA: Diagnosis present

## 2017-01-25 DIAGNOSIS — Z79899 Other long term (current) drug therapy: Secondary | ICD-10-CM | POA: Diagnosis not present

## 2017-01-25 LAB — URINALYSIS, ROUTINE W REFLEX MICROSCOPIC
Bilirubin Urine: NEGATIVE
GLUCOSE, UA: NEGATIVE mg/dL
KETONES UR: NEGATIVE mg/dL
Leukocytes, UA: NEGATIVE
Nitrite: NEGATIVE
PH: 5 (ref 5.0–8.0)
Protein, ur: 30 mg/dL — AB
SPECIFIC GRAVITY, URINE: 1.027 (ref 1.005–1.030)

## 2017-01-25 LAB — BASIC METABOLIC PANEL
ANION GAP: 10 (ref 5–15)
BUN: 16 mg/dL (ref 6–20)
CHLORIDE: 107 mmol/L (ref 101–111)
CO2: 20 mmol/L — ABNORMAL LOW (ref 22–32)
Calcium: 9.5 mg/dL (ref 8.9–10.3)
Creatinine, Ser: 1.09 mg/dL (ref 0.61–1.24)
GFR calc Af Amer: >60 mL/min (ref >60)
GFR calc non Af Amer: >60 mL/min (ref >60)
GLUCOSE: 108 mg/dL — AB (ref 65–99)
POTASSIUM: 3.7 mmol/L (ref 3.5–5.1)
Sodium: 137 mmol/L (ref 135–145)

## 2017-01-25 LAB — CBC WITH DIFFERENTIAL/PLATELET
BASOS ABS: 0 10*3/uL (ref 0.0–0.1)
Basophils Relative: 0 %
Eosinophils Absolute: 0.2 10*3/uL (ref 0.0–0.7)
Eosinophils Relative: 1 %
HCT: 40.3 % (ref 39.0–52.0)
HEMOGLOBIN: 13.3 g/dL (ref 13.0–17.0)
LYMPHS ABS: 1.9 10*3/uL (ref 0.7–4.0)
LYMPHS PCT: 13 %
MCH: 29.6 pg (ref 26.0–34.0)
MCHC: 33 g/dL (ref 30.0–36.0)
MCV: 89.8 fL (ref 78.0–100.0)
Monocytes Absolute: 1.1 10*3/uL — ABNORMAL HIGH (ref 0.1–1.0)
Monocytes Relative: 8 %
NEUTROS ABS: 11.5 10*3/uL — AB (ref 1.7–7.7)
NEUTROS PCT: 78 %
Platelets: 323 10*3/uL (ref 150–400)
RBC: 4.49 MIL/uL (ref 4.22–5.81)
RDW: 13.1 % (ref 11.5–15.5)
WBC: 14.7 10*3/uL — ABNORMAL HIGH (ref 4.0–10.5)

## 2017-01-25 MED ORDER — OXYCODONE-ACETAMINOPHEN 5-325 MG PO TABS
1.0000 | ORAL_TABLET | ORAL | Status: DC | PRN
  Administered 2017-01-25: 1 via ORAL

## 2017-01-25 MED ORDER — KETOROLAC TROMETHAMINE 15 MG/ML IJ SOLN
15.0000 mg | Freq: Once | INTRAMUSCULAR | Status: AC
  Administered 2017-01-25: 15 mg via INTRAVENOUS
  Filled 2017-01-25: qty 1

## 2017-01-25 MED ORDER — SODIUM CHLORIDE 0.9 % IV BOLUS (SEPSIS): 1000.0000 mL | Freq: Once | INTRAVENOUS | Status: DC

## 2017-01-25 MED ORDER — ONDANSETRON 4 MG PO TBDP: 4.0000 mg | ORAL_TABLET | Freq: Three times a day (TID) | ORAL | 0 refills | Status: AC | PRN

## 2017-01-25 MED ORDER — OXYCODONE-ACETAMINOPHEN 5-325 MG PO TABS: 1.0000 | ORAL_TABLET | ORAL | 0 refills | Status: AC | PRN

## 2017-01-25 MED ORDER — SODIUM CHLORIDE 0.9 % IV BOLUS (SEPSIS)
1000.0000 mL | Freq: Once | INTRAVENOUS | Status: AC
  Administered 2017-01-25: 1000 mL via INTRAVENOUS

## 2017-01-25 MED ORDER — HYDROMORPHONE HCL 1 MG/ML IJ SOLN
1.0000 mg | Freq: Once | INTRAMUSCULAR | Status: AC
  Administered 2017-01-25: 1 mg via INTRAVENOUS
  Filled 2017-01-25: qty 1

## 2017-01-25 MED ORDER — IBUPROFEN 600 MG PO TABS: 600.0000 mg | ORAL_TABLET | Freq: Three times a day (TID) | ORAL | 0 refills | Status: AC | PRN

## 2017-01-25 MED ORDER — OXYCODONE-ACETAMINOPHEN 5-325 MG PO TABS
ORAL_TABLET | ORAL | Status: AC
  Filled 2017-01-25: qty 1

## 2017-01-25 MED ORDER — TAMSULOSIN HCL 0.4 MG PO CAPS: 0.4000 mg | ORAL_CAPSULE | Freq: Every day | ORAL | 0 refills | Status: AC

## 2017-01-25 MED ORDER — ONDANSETRON HCL 4 MG/2ML IJ SOLN
4.0000 mg | Freq: Once | INTRAMUSCULAR | Status: AC
  Administered 2017-01-25: 4 mg via INTRAVENOUS
  Filled 2017-01-25: qty 2

## 2017-01-25 MED ORDER — TRIAMCINOLONE ACETONIDE 0.5 % EX OINT: 1.0000 "application " | TOPICAL_OINTMENT | Freq: Two times a day (BID) | CUTANEOUS | 0 refills | Status: AC

## 2017-01-25 MED ORDER — TAMSULOSIN HCL 0.4 MG PO CAPS
0.4000 mg | ORAL_CAPSULE | Freq: Once | ORAL | Status: AC
  Administered 2017-01-25: 0.4 mg via ORAL
  Filled 2017-01-25: qty 1

## 2017-01-25 MED ORDER — OXYCODONE-ACETAMINOPHEN 5-325 MG PO TABS
2.0000 | ORAL_TABLET | Freq: Once | ORAL | Status: AC
  Administered 2017-01-25: 2 via ORAL
  Filled 2017-01-25: qty 2

## 2017-01-25 NOTE — Discharge Instructions (Signed)
**Note De-Identified [place] Obfuscation** Drink plenty of fluids, at least 8-10 glasses of water per day

## 2017-01-25 NOTE — ED Provider Notes (Signed)
**Note De-Identified Valentino Obfuscation** [facility] DEPT Provider Note   CSN: [id number] Arrival date & time: 01/25/17  0342     History   Chief Complaint Chief Complaint  Patient presents with  . Flank Pain    HPI Derek Donaldson is a 46 y.o. male.  HPI   46 yo M with h/o chiari malformation s/p decompression, HTN, HLD, h/o kidney stones here with right flank pain. Pt reports his sx started last night as 10-15 min of mild right flank pain. He went to sleep but awoke around midnight, with severe aching, cramp like, throbbing right flank pain. It has not moved. He has had associated nausea, but no vomiting. No diarrhea. No urinary frequency or gross hematuria. Sx feel similar to his prior renal stones. No fevers, chills or recent infectious sx. Pain worsens with certain movements. No alleviating factors. No cough or CP. He is s/p cholecystectomy.  Past Medical History:  Diagnosis Date  . Asthma    Albuterol inhaler prn;just a small touch  . Cancer ([place]) 2010   skin ( unsure of the pathology)- removed  . Chiari malformation   . Essential hypertension, benign    takes Metoprolol,Diovan,and Amlodipine daily  . Headache    migraines in the past  . History of bronchitis   . History of kidney stones    passed on his own  . Mixed hyperlipidemia    takes Fenofibrate daily  . Muscle spasm    takes Flexeril daily as needed  . Numbness    left arm from chiari malformation  . Pneumonia 2005   hx of  . Shortness of breath    with exertion    Patient Active Problem List   Diagnosis Date Noted  . Chemical meningitis   . 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 Mild intermittent asthma 12/18/2016  . Hyponatremia 12/18/2016  . Leukocytosis   . Facial nerve palsy, secondary   . ICH (intracerebral hemorrhage) ([place]) 07/15/2014  . Dural arteriovenous fistula   . AVF (arteriovenous fistula) ([place]) 06/05/2014  . 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 Chiari malformation type I ([place]) 12/21/2013  . Chronic  cholecystitis with calculus s/p lap chole [id number] 04/03/2012  . Choledocholithiasis s/p ERCP [id number] 03/21/2012  . GERD (gastroesophageal reflux disease)   . Essential hypertension, benign 09/22/2011  . Mixed hyperlipidemia 09/22/2011    Past Surgical History:  Procedure Laterality Date  . CEREBRAL ANGIOGRAM  12/2013  . CHOLECYSTECTOMY  11/13  . ERCP  03/21/2012   Procedure: ENDOSCOPIC RETROGRADE CHOLANGIOPANCREATOGRAPHY (ERCP);  Surgeon: Jeryl Columbia, MD;  Location: [facility];  Service: Endoscopy;  Laterality: N/A;  aqntibiotics and type and screen  . IR GENERIC HISTORICAL  07/20/2016   IR ANGIO INTRA EXTRACRAN SEL COM CAROTID INNOMINATE BILAT MOD SED 07/20/2016 Luanne Bras, MD [facility]  . IR GENERIC HISTORICAL  07/20/2016   IR ANGIO VERTEBRAL SEL VERTEBRAL UNI R MOD SED 07/20/2016 Luanne Bras, MD [facility]  . IR GENERIC HISTORICAL  07/20/2016   IR ANGIO VERTEBRAL SEL SUBCLAVIAN INNOMINATE UNI L MOD SED 07/20/2016 Luanne Bras, MD [facility]  . MRI     x 2  . RADIOLOGY WITH ANESTHESIA N/A 02/20/2014   Procedure: RADIOLOGY WITH ANESTHESIA EMBOLIZATION;  Surgeon: Rob Hickman, MD;  Location: [facility];  Service: Radiology;  Laterality: N/A;  . RADIOLOGY WITH ANESTHESIA N/A 04/29/2014   Procedure: STAGE TWO EMBOLIZATION;  Surgeon: Luanne Bras, MD;  Location: [facility];  Service: Radiology;  Laterality: N/A;  . RADIOLOGY WITH ANESTHESIA **Note De-Identified Hollerbach Obfuscation** N/A 06/05/2014   Procedure: Embolization;  Surgeon: Rob Hickman, MD;  Location: [facility];  Service: Radiology;  Laterality: N/A;  . RADIOLOGY WITH ANESTHESIA N/A 07/15/2014   Procedure: RADIOLOGY WITH ANESTHESIA/EMBOLIZATION;  Surgeon: Rob Hickman, MD;  Location: [facility];  Service: Radiology;  Laterality: N/A;  . STAPEDECTOMY  04/23/2003   Revision left stapedectomy  . SUBOCCIPITAL CRANIECTOMY CERVICAL LAMINECTOMY N/A 12/21/2013   Procedure: Aborted Cranioplasty For Chiari Malformation (Could not complete procedure);  Surgeon:  Ashok Pall, MD;  Location: [facility];  Service: Neurosurgery;  Laterality: N/A;  Aborted Cranioplasty For Chiari Malformation  . SUBOCCIPITAL CRANIECTOMY CERVICAL LAMINECTOMY N/A 12/09/2016   Procedure: CHIARI DECOMPRESSION;  Surgeon: Ashok Pall, MD;  Location: [facility];  Service: Neurosurgery;  Laterality: N/A;  Posterior   . VASECTOMY         Home Medications    Prior to Admission medications   Medication Sig Start Date End Date Taking? Authorizing Provider  amLODipine (NORVASC) 10 MG tablet Take 10 mg by mouth daily before breakfast.    Yes [provider]  aspirin EC 81 MG tablet Take 81 mg by mouth daily.   Yes [provider]  diphenhydrAMINE (BENADRYL) 25 mg capsule Take 25 mg by mouth every 6 (six) hours as needed for itching.   Yes [provider]  fenofibrate micronized (ANTARA) 130 MG capsule Take 130 mg by mouth daily before breakfast.   Yes [provider]  HYDROcodone-acetaminophen (NORCO/VICODIN) 5-325 MG tablet Take 1 tablet by mouth every 6 (six) hours as needed for moderate pain. 12/11/16  Yes Ashok Pall, MD  metoprolol succinate (TOPROL-XL) 25 MG 24 hr tablet Take 25 mg by mouth daily before breakfast.    Yes [provider]  potassium chloride SA (K-DUR,KLOR-CON) 20 MEQ tablet Take 1 tablet (20 mEq total) by mouth daily. 10/20/15  Yes Milton Ferguson, MD  tiZANidine (ZANAFLEX) 4 MG tablet Take 1 tablet (4 mg total) by mouth every 6 (six) hours as needed for muscle spasms. 12/11/16  Yes Ashok Pall, MD  valsartan-hydrochlorothiazide (DIOVAN-HCT) 320-25 MG per tablet Take 1 tablet by mouth daily before breakfast.    Yes [provider]  albuterol (PROVENTIL HFA;VENTOLIN HFA) 108 (90 BASE) MCG/ACT inhaler Inhale 2 puffs into the lungs every 4 (four) hours as needed for wheezing or shortness of breath.    [provider]  EPINEPHrine 0.3 mg/0.3 mL IJ SOAJ injection Inject 0.3 mg into the muscle once as needed  (severe allergic reaction).     [provider]  ibuprofen (ADVIL,MOTRIN) 600 MG tablet Take 1 tablet (600 mg total) by mouth every 8 (eight) hours as needed for mild pain. 01/25/17   Duffy Bruce, MD  ondansetron (ZOFRAN ODT) 4 MG disintegrating tablet Take 1 tablet (4 mg total) by mouth every 8 (eight) hours as needed for nausea or vomiting. 01/25/17   Duffy Bruce, MD  oxyCODONE-acetaminophen (PERCOCET/ROXICET) 5-325 MG tablet Take 1-2 tablets by mouth every 4 (four) hours as needed for severe pain. 01/25/17   Duffy Bruce, MD  tamsulosin (FLOMAX) 0.4 MG CAPS capsule Take 1 capsule (0.4 mg total) by mouth daily. 01/25/17 02/01/17  Duffy Bruce, MD  triamcinolone ointment (KENALOG) 0.5 % Apply 1 application topically 2 (two) times daily. 01/25/17 02/04/17  Duffy Bruce, MD    Family History Family History  Problem Relation Age of Onset  . Aneurysm Father   . Asthma Mother   . Asthma Brother   . Heart failure Maternal Grandmother   . **Note De-Identified Beecher Obfuscation** Cancer Maternal Grandfather        unknown type  . Stroke Paternal Grandfather     Social History Social History  Substance Use Topics  . Smoking status: Never Smoker  . Smokeless tobacco: Never Used  . Alcohol use No     Allergies   Contrast media [iodinated diagnostic agents] and Shellfish allergy   Review of Systems Review of Systems  Constitutional: Positive for fatigue. Negative for chills and fever.  HENT: Negative for congestion and rhinorrhea.   Eyes: Negative for visual disturbance.  Respiratory: Negative for cough, shortness of breath and wheezing.   Cardiovascular: Negative for chest pain and leg swelling.  Gastrointestinal: Positive for nausea. Negative for abdominal pain, diarrhea and vomiting.  Genitourinary: Positive for flank pain. Negative for dysuria.  Musculoskeletal: Negative for neck pain and neck stiffness.  Skin: Negative for rash and wound.  Allergic/Immunologic: Negative for immunocompromised state.    Neurological: Negative for syncope, weakness and headaches.  All other systems reviewed and are negative.    Physical Exam Updated Vital Signs BP (!) 155/105   Pulse 83   Temp 97.8 F (36.6 C) (Oral)   Resp 16   SpO2 98%   Physical Exam  Constitutional: He is oriented to person, place, and time. He appears well-developed and well-nourished. No distress.  HENT:  Head: Normocephalic and atraumatic.  Mouth/Throat: Oropharynx is clear and moist.  Eyes: Conjunctivae are normal.  Neck: Neck supple.  Cardiovascular: Normal rate, regular rhythm and normal heart sounds.  Exam reveals no friction rub.   No murmur heard. Pulmonary/Chest: Effort normal and breath sounds normal. No respiratory distress. He has no wheezes. He has no rales.  Abdominal: Soft. Bowel sounds are normal. He exhibits no distension. There is tenderness (mild TTP RUQ, RLQ; no rebound or guarding; no CVAT). There is no rebound and no guarding.  Musculoskeletal: He exhibits no edema.  Neurological: He is alert and oriented to person, place, and time. He exhibits normal muscle tone.  Skin: Skin is warm. Capillary refill takes less than 2 seconds.  Psychiatric: He has a normal mood and affect.  Nursing note and vitals reviewed.    ED Treatments / Results  Labs (all labs ordered are listed, but only abnormal results are displayed) Labs Reviewed  URINALYSIS, ROUTINE W REFLEX MICROSCOPIC - Abnormal; Notable for the following:       Result Value   APPearance HAZY (*)    Hgb urine dipstick LARGE (*)    Protein, ur 30 (*)    Bacteria, UA RARE (*)    Squamous Epithelial / LPF 0-5 (*)    All other components within normal limits  CBC WITH DIFFERENTIAL/PLATELET - Abnormal; Notable for the following:    WBC 14.7 (*)    Neutro Abs 11.5 (*)    Monocytes Absolute 1.1 (*)    All other components within normal limits  BASIC METABOLIC PANEL - Abnormal; Notable for the following:    CO2 20 (*)    Glucose, Bld 108 (*)     All other components within normal limits    EKG  EKG Interpretation None       Radiology Ct Renal Stone Study  Result Date: 01/25/2017 CLINICAL DATA:  Right side flank pain. EXAM: CT ABDOMEN AND PELVIS WITHOUT CONTRAST TECHNIQUE: Multidetector CT imaging of the abdomen and pelvis was performed following the standard protocol without IV contrast. COMPARISON:  None. FINDINGS: Lower chest: Linear subsegmental atelectasis in the lung bases. No effusions. Heart is **Note De-Identified Seidenberg Obfuscation** normal size. Hepatobiliary: Fatty infiltration of the liver. No focal abnormality or biliary ductal dilatation. Prior cholecystectomy. Pancreas: No focal abnormality or ductal dilatation. Spleen: No focal abnormality.  Normal size. Adrenals/Urinary Tract: Punctate nonobstructing renal stones bilaterally. There is mild right hydronephrosis due to a 2 mm proximal right ureteral stone. Urinary bladder is decompressed. Adrenal glands unremarkable. Stomach/Bowel: Appendix is normal. Stomach, large and small bowel grossly unremarkable. Vascular/Lymphatic: No evidence of aneurysm or adenopathy. Reproductive: No visible focal abnormality. Other: No free fluid or free air. Musculoskeletal: No acute bony abnormality. IMPRESSION: 2 mm proximal right ureteral stone with mild right hydronephrosis. Bilateral punctate nephrolithiasis. Fatty liver. Electronically Signed   By: Rolm Baptise M.D.   On: 01/25/2017 09:13    Procedures Procedures (including critical care time)  Medications Ordered in ED Medications  sodium chloride 0.9 % bolus 1,000 mL (0 mLs Intravenous Stopped 01/25/17 0919)  HYDROmorphone (DILAUDID) injection 1 mg (1 mg Intravenous Given 01/25/17 0820)  ketorolac (TORADOL) 15 MG/ML injection 15 mg (15 mg Intravenous Given 01/25/17 0820)  ondansetron (ZOFRAN) injection 4 mg (4 mg Intravenous Given 01/25/17 0820)  tamsulosin (FLOMAX) capsule 0.4 mg (0.4 mg Oral Given 01/25/17 1004)  oxyCODONE-acetaminophen (PERCOCET/ROXICET) 5-325 MG per  tablet 2 tablet (2 tablets Oral Given 01/25/17 1004)     Initial Impression / Assessment and Plan / ED Course  I have reviewed the triage vital signs and the nursing notes.  Pertinent labs & imaging results that were available during my care of the patient were reviewed by me and considered in my medical decision making (see chart for details).     46 yo M here with acute onset right flank pain, secondary to 2 mm obstructing stone. No fever, chills, normal VS - do not suspect infection or sepsis. He has a mild, likely reactive leukocytosis but is o/w well appearing. He has normal renal function. No complicating features. Testes without signs of torsion. He feels improved in ED with analgesia. Will d/c with outpt follow-up.  Final Clinical Impressions(s) / ED Diagnoses   Final diagnoses:  Right kidney stone  Nephrolithiasis    New Prescriptions Discharge Medication List as of 01/25/2017  9:59 AM    START taking these medications   Details  ondansetron (ZOFRAN ODT) 4 MG disintegrating tablet Take 1 tablet (4 mg total) by mouth every 8 (eight) hours as needed for nausea or vomiting., Starting Tue 01/25/2017, Print    oxyCODONE-acetaminophen (PERCOCET/ROXICET) 5-325 MG tablet Take 1-2 tablets by mouth every 4 (four) hours as needed for severe pain., Starting Tue 01/25/2017, Print    tamsulosin (FLOMAX) 0.4 MG CAPS capsule Take 1 capsule (0.4 mg total) by mouth daily., Starting Tue 01/25/2017, Until Tue 02/01/2017, Print         Duffy Bruce, MD 01/25/17 Joen Laura

## 2017-01-25 NOTE — ED Triage Notes (Signed)
**Note De-Identified Raby Obfuscation** Pt c/o R sided back pain radiating into R flank, denies urinary issues, hx of kidney stones, pain feels similar

## 2017-05-02 ENCOUNTER — Other Ambulatory Visit: Payer: Self-pay | Admitting: Physician Assistant

## 2017-05-02 DIAGNOSIS — R1032 Left lower quadrant pain: Secondary | ICD-10-CM

## 2017-05-02 DIAGNOSIS — R1012 Left upper quadrant pain: Secondary | ICD-10-CM

## 2017-05-02 DIAGNOSIS — R140 Abdominal distension (gaseous): Secondary | ICD-10-CM

## 2017-05-11 ENCOUNTER — Ambulatory Visit
Admission: RE | Admit: 2017-05-11 | Discharge: 2017-05-11 | Disposition: A | Discharge status: Home / Self Care | Payer: BLUE CROSS/BLUE SHIELD | Source: Ambulatory Visit | Attending: Physician Assistant | Admitting: Physician Assistant

## 2017-05-11 DIAGNOSIS — R1012 Left upper quadrant pain: Secondary | ICD-10-CM

## 2017-05-11 DIAGNOSIS — R140 Abdominal distension (gaseous): Secondary | ICD-10-CM

## 2017-05-11 DIAGNOSIS — R1032 Left lower quadrant pain: Secondary | ICD-10-CM

## 2017-11-24 NOTE — Telephone Encounter (Signed)
**Note De-identified Kleen Obfuscation** Signing past encounter of attempted phone call 

## 2018-10-15 ENCOUNTER — Encounter (HOSPITAL_COMMUNITY): Payer: Self-pay

## 2018-10-15 ENCOUNTER — Other Ambulatory Visit: Payer: Self-pay

## 2018-10-15 ENCOUNTER — Ambulatory Visit (HOSPITAL_COMMUNITY)
Admission: EM | Admit: 2018-10-15 | Discharge: 2018-10-15 | Disposition: A | Discharge status: Home / Self Care | Payer: BLUE CROSS/BLUE SHIELD | Attending: Family Medicine | Admitting: Family Medicine

## 2018-10-15 DIAGNOSIS — R197 Diarrhea, unspecified: Secondary | ICD-10-CM | POA: Diagnosis not present

## 2018-10-15 MED ORDER — SIMETHICONE 80 MG PO CHEW: 80.0000 mg | CHEWABLE_TABLET | Freq: Four times a day (QID) | ORAL | 0 refills | Status: AC | PRN

## 2018-10-15 NOTE — ED Triage Notes (Signed)
**Note De-Identified Pete Obfuscation** Pt presents with diarrhea for past 2 days.

## 2018-10-15 NOTE — Discharge Instructions (Signed)
**Note De-Identified [place] Obfuscation** Small frequent sips of fluids- Pedialyte, Gatorade, water, broth- to maintain hydration.   Simethicone as needed for gas.  Bland diet as tolerated. Advance as tolerated.  Wash hands well and regularly.  If worsening of pain, fevers, nausea or vomiting or otherwise worsening please be seen again or go to the ER.  Continue to follow with GI as needed.

## 2018-10-15 NOTE — ED Provider Notes (Signed)
**Note De-Identified Bua Obfuscation** [place]    CSN: [id number] Arrival date & time: 10/15/18  1006     History   Chief Complaint Chief Complaint  Patient presents with  . Diarrhea    HPI Derek Donaldson is a 48 y.o. male.   Derek Donaldson presents with complaints of diarrhea and bloating which started 5/28. Has had three episodes of loose stool today. Loose and dark green. States now he has significant gas production. He overall feels he has improved some. No nausea or vomiting. Appetite has been normal. Normal urination. States he had a temp of 100.7 at onset of symptoms but no fever since. No blood or black in stool. Has been taking pepto bismol and advil for his symptoms which haven't helped. States he has had increased bloating for some time now, has seen GI for this with negative work up. States he had eaten "beanie wienies" prior to onset of symptoms. No known ill contacts. No recent travel. No URI symptoms. Hx of asthma, chiari malformation with surgery and meningitis, htn.     ROS per HPI, negative if not otherwise mentioned.      Past Medical History:  Diagnosis Date  . Asthma    Albuterol inhaler prn;just a small touch  . Cancer ([place]) 2010   skin ( unsure of the pathology)- removed  . Chiari malformation   . Essential hypertension, benign    takes Metoprolol,Diovan,and Amlodipine daily  . Headache    migraines in the past  . History of bronchitis   . History of kidney stones    passed on his own  . Mixed hyperlipidemia    takes Fenofibrate daily  . Muscle spasm    takes Flexeril daily as needed  . Numbness    left arm from chiari malformation  . Pneumonia 2005   hx of  . Shortness of breath    with exertion    Patient Active Problem List   Diagnosis Date Noted  . Chemical meningitis   . 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 Mild intermittent asthma 12/18/2016  . Hyponatremia 12/18/2016  . Leukocytosis   . Facial nerve palsy, secondary   . ICH (intracerebral hemorrhage)  ([place]) 07/15/2014  . Dural arteriovenous fistula   . AVF (arteriovenous fistula) ([place]) 06/05/2014  . 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 Chronic cholecystitis with calculus s/p lap chole [id number] 04/03/2012  . Choledocholithiasis s/p ERCP [id number] 03/21/2012  . GERD (gastroesophageal reflux disease)   . Essential hypertension, benign 09/22/2011  . Mixed hyperlipidemia 09/22/2011    Past Surgical History:  Procedure Laterality Date  . CEREBRAL ANGIOGRAM  12/2013  . CHOLECYSTECTOMY  11/13  . ERCP  03/21/2012   Procedure: ENDOSCOPIC RETROGRADE CHOLANGIOPANCREATOGRAPHY (ERCP);  Surgeon: Jeryl Columbia, MD;  Location: [facility];  Service: Endoscopy;  Laterality: N/A;  aqntibiotics and type and screen  . IR GENERIC HISTORICAL  07/20/2016   IR ANGIO INTRA EXTRACRAN SEL COM CAROTID INNOMINATE BILAT MOD SED 07/20/2016 Luanne Bras, MD [facility]  . IR GENERIC HISTORICAL  07/20/2016   IR ANGIO VERTEBRAL SEL VERTEBRAL UNI R MOD SED 07/20/2016 Luanne Bras, MD [facility]  . IR GENERIC HISTORICAL  07/20/2016   IR ANGIO VERTEBRAL SEL SUBCLAVIAN INNOMINATE UNI L MOD SED 07/20/2016 Luanne Bras, MD [facility]  . MRI     x 2  . RADIOLOGY WITH ANESTHESIA N/A 02/20/2014   Procedure: RADIOLOGY WITH ANESTHESIA EMBOLIZATION;  Surgeon: Rob Hickman, **Note De-Identified Grist Obfuscation** MD;  Location: [place];  Service: Radiology;  Laterality: N/A;  . RADIOLOGY WITH ANESTHESIA N/A 04/29/2014   Procedure: STAGE TWO EMBOLIZATION;  Surgeon: Luanne Bras, MD;  Location: [place];  Service: Radiology;  Laterality: N/A;  . RADIOLOGY WITH ANESTHESIA N/A 06/05/2014   Procedure: Embolization;  Surgeon: Rob Hickman, MD;  Location: [place];  Service: Radiology;  Laterality: N/A;  . RADIOLOGY WITH ANESTHESIA N/A 07/15/2014   Procedure: RADIOLOGY WITH ANESTHESIA/EMBOLIZATION;  Surgeon: Rob Hickman, MD;  Location: [place];  Service: Radiology;  Laterality: N/A;  .  STAPEDECTOMY  04/23/2003   Revision left stapedectomy  . SUBOCCIPITAL CRANIECTOMY CERVICAL LAMINECTOMY N/A 12/21/2013   Procedure: Aborted Cranioplasty For Chiari Malformation (Could not complete procedure);  Surgeon: Ashok Pall, MD;  Location: [facility] [facility];  Service: Neurosurgery;  Laterality: N/A;  Aborted Cranioplasty For Chiari Malformation  . SUBOCCIPITAL CRANIECTOMY CERVICAL LAMINECTOMY N/A 12/09/2016   Procedure: CHIARI DECOMPRESSION;  Surgeon: Ashok Pall, MD;  Location: [place];  Service: Neurosurgery;  Laterality: N/A;  Posterior   . VASECTOMY         Home Medications    Prior to Admission medications   Medication Sig Start Date End Date Taking? Authorizing Provider  albuterol (PROVENTIL HFA;VENTOLIN HFA) 108 (90 BASE) MCG/ACT inhaler Inhale 2 puffs into the lungs every 4 (four) hours as needed for wheezing or shortness of breath.    [provider]  amLODipine (NORVASC) 10 MG tablet Take 10 mg by mouth daily before breakfast.     [provider]  aspirin EC 81 MG tablet Take 81 mg by mouth daily.    [provider]  diphenhydrAMINE (BENADRYL) 25 mg capsule Take 25 mg by mouth every 6 (six) hours as needed for itching.    [provider]  EPINEPHrine 0.3 mg/0.3 mL IJ SOAJ injection Inject 0.3 mg into the muscle once as needed (severe allergic reaction).     [provider]  fenofibrate micronized (ANTARA) 130 MG capsule Take 130 mg by mouth daily before breakfast.    [provider]  HYDROcodone-acetaminophen (NORCO/VICODIN) 5-325 MG tablet Take 1 tablet by mouth every 6 (six) hours as needed for moderate pain. 12/11/16   Ashok Pall, MD  ibuprofen (ADVIL,MOTRIN) 600 MG tablet Take 1 tablet (600 mg total) by mouth every 8 (eight) hours as needed for mild pain. 01/25/17   Duffy Bruce, MD  metoprolol succinate (TOPROL-XL) 25 MG 24 hr tablet Take 25 mg by mouth daily before breakfast.     [provider]  ondansetron  (ZOFRAN ODT) 4 MG disintegrating tablet Take 1 tablet (4 mg total) by mouth every 8 (eight) hours as needed for nausea or vomiting. 01/25/17   Duffy Bruce, MD  oxyCODONE-acetaminophen (PERCOCET/ROXICET) 5-325 MG tablet Take 1-2 tablets by mouth every 4 (four) hours as needed for severe pain. 01/25/17   Duffy Bruce, MD  potassium chloride SA (K-DUR,KLOR-CON) 20 MEQ tablet Take 1 tablet (20 mEq total) by mouth daily. 10/20/15   Milton Ferguson, MD  simethicone (GAS-X) 80 MG chewable tablet Chew 1 tablet (80 mg total) by mouth every 6 (six) hours as needed for flatulence. 10/15/18   Zigmund Gottron, NP  tiZANidine (ZANAFLEX) 4 MG tablet Take 1 tablet (4 mg total) by mouth every 6 (six) hours as needed for muscle spasms. 12/11/16   Ashok Pall, MD  valsartan-hydrochlorothiazide (DIOVAN-HCT) 320-25 MG per tablet Take 1 tablet by mouth daily before breakfast.     [provider] **Note De-Identified Cremer Obfuscation** Family History Family History  Problem Relation Age of Onset  . Aneurysm Father   . Asthma Mother   . Asthma Brother   . Heart failure Maternal Grandmother   . Cancer Maternal Grandfather        unknown type  . Stroke Paternal Grandfather     Social History Social History   Tobacco Use  . Smoking status: Never Smoker  . Smokeless tobacco: Never Used  Substance Use Topics  . Alcohol use: No  . Drug use: No     Allergies   Contrast media [iodinated diagnostic agents] and Shellfish allergy   Review of Systems Review of Systems   Physical Exam Triage Vital Signs ED Triage Vitals  Enc Vitals Group     BP 10/15/18 1025 (!) 178/102     Pulse Rate 10/15/18 1025 87     Resp 10/15/18 1025 18     Temp 10/15/18 1025 98.1 F (36.7 C)     Temp Source 10/15/18 1025 Oral     SpO2 10/15/18 1025 98 %     Weight --      Height --      Head Circumference --      Peak Flow --      Pain Score 10/15/18 1026 2     Pain Loc --      Pain Edu? --      Excl. in [place]? --    No data found.  Updated  Vital Signs BP (!) 178/102 (BP Location: Left Arm)   Pulse 87   Temp 98.1 F (36.7 C) (Oral)   Resp 18   SpO2 98%    Physical Exam Constitutional:      Appearance: He is well-developed.  Cardiovascular:     Rate and Rhythm: Normal rate and regular rhythm.  Pulmonary:     Effort: Pulmonary effort is normal.     Breath sounds: Normal breath sounds.  Abdominal:     General: There is distension.     Palpations: Abdomen is soft.     Tenderness: There is generalized abdominal tenderness and tenderness in the right lower quadrant.     Comments: Largely distended; mild generalized tenderness with some increased tenderness inconsistently to RLQ; no pain with heel strike   Skin:    General: Skin is warm and dry.  Neurological:     Mental Status: He is alert and oriented to person, place, and time.      UC Treatments / Results  Labs (all labs ordered are listed, but only abnormal results are displayed) Labs Reviewed - No data to display  EKG None  Radiology No results found.  Procedures Procedures (including critical care time)  Medications Ordered in UC Medications - No data to display  Initial Impression / Assessment and Plan / UC Course  I have reviewed the triage vital signs and the nursing notes.  Pertinent labs & imaging results that were available during my care of the patient were reviewed by me and considered in my medical decision making (see chart for details).     Diarrhea for the past 3 days with gradual improvement. Afebrile here. No tachycardia. Taking PO. Exam without findings consistent with acute abdomen. Diarrhea has improved. Significant bloating, although it sounds like this is not necessarily all new for him. Continue with supportive cares, simethicone recommended as well. Return precautions provided. Appendicitis discussed as differential as well. Patient verbalized understanding and agreeable to plan.   Ambulatory out of clinic without difficulty. **Note De-Identified Azar Obfuscation** Final Clinical Impressions(s) / UC Diagnoses   Final diagnoses:  Diarrhea, unspecified type     Discharge Instructions     Small frequent sips of fluids- Pedialyte, Gatorade, water, broth- to maintain hydration.   Simethicone as needed for gas.  Bland diet as tolerated. Advance as tolerated.  Wash hands well and regularly.  If worsening of pain, fevers, nausea or vomiting or otherwise worsening please be seen again or go to the ER.  Continue to follow with GI as needed.    ED Prescriptions    Medication Sig Dispense Auth. Provider   simethicone (GAS-X) 80 MG chewable tablet Chew 1 tablet (80 mg total) by mouth every 6 (six) hours as needed for flatulence. 30 tablet Zigmund Gottron, NP     Controlled Substance Prescriptions [place] Controlled Substance Registry consulted? Not Applicable   Zigmund Gottron, NP 10/15/18 1534

## 2018-10-18 ENCOUNTER — Encounter (HOSPITAL_COMMUNITY): Payer: Self-pay | Admitting: *Deleted

## 2018-10-18 ENCOUNTER — Emergency Department (HOSPITAL_COMMUNITY): Payer: BLUE CROSS/BLUE SHIELD

## 2018-10-18 ENCOUNTER — Emergency Department (HOSPITAL_COMMUNITY)
Admission: EM | Admit: 2018-10-18 | Discharge: 2018-10-18 | Disposition: A | Discharge status: Home / Self Care | Payer: BLUE CROSS/BLUE SHIELD | Attending: Emergency Medicine | Admitting: Emergency Medicine

## 2018-10-18 DIAGNOSIS — K529 Noninfective gastroenteritis and colitis, unspecified: Secondary | ICD-10-CM | POA: Diagnosis not present

## 2018-10-18 DIAGNOSIS — J45909 Unspecified asthma, uncomplicated: Secondary | ICD-10-CM | POA: Insufficient documentation

## 2018-10-18 DIAGNOSIS — Q0700 Arnold-Chiari syndrome without spina bifida or hydrocephalus: Secondary | ICD-10-CM | POA: Diagnosis not present

## 2018-10-18 DIAGNOSIS — Z85828 Personal history of other malignant neoplasm of skin: Secondary | ICD-10-CM | POA: Diagnosis not present

## 2018-10-18 DIAGNOSIS — Z79899 Other long term (current) drug therapy: Secondary | ICD-10-CM | POA: Insufficient documentation

## 2018-10-18 DIAGNOSIS — K429 Umbilical hernia without obstruction or gangrene: Secondary | ICD-10-CM | POA: Insufficient documentation

## 2018-10-18 DIAGNOSIS — Z7982 Long term (current) use of aspirin: Secondary | ICD-10-CM | POA: Insufficient documentation

## 2018-10-18 DIAGNOSIS — R197 Diarrhea, unspecified: Secondary | ICD-10-CM | POA: Diagnosis present

## 2018-10-18 DIAGNOSIS — I10 Essential (primary) hypertension: Secondary | ICD-10-CM | POA: Diagnosis not present

## 2018-10-18 LAB — COMPREHENSIVE METABOLIC PANEL
ALT: 37 U/L (ref 0–44)
AST: 36 U/L (ref 15–41)
Albumin: 3.8 g/dL (ref 3.5–5.0)
Alkaline Phosphatase: 38 U/L (ref 38–126)
Anion gap: 11 (ref 5–15)
BUN: 12 mg/dL (ref 6–20)
CO2: 23 mmol/L (ref 22–32)
Calcium: 9.3 mg/dL (ref 8.9–10.3)
Chloride: 106 mmol/L (ref 98–111)
Creatinine, Ser: 0.92 mg/dL (ref 0.61–1.24)
GFR calc Af Amer: >60 mL/min (ref >60)
GFR calc non Af Amer: >60 mL/min (ref >60)
Glucose, Bld: 103 mg/dL — ABNORMAL HIGH (ref 70–99)
Potassium: 3.9 mmol/L (ref 3.5–5.1)
Sodium: 140 mmol/L (ref 135–145)
Total Bilirubin: 1.5 mg/dL — ABNORMAL HIGH (ref 0.3–1.2)
Total Protein: 6.8 g/dL (ref 6.5–8.1)

## 2018-10-18 LAB — URINALYSIS, ROUTINE W REFLEX MICROSCOPIC
Bacteria, UA: NONE SEEN
Bilirubin Urine: NEGATIVE
Glucose, UA: NEGATIVE mg/dL
Ketones, ur: NEGATIVE mg/dL
Leukocytes,Ua: NEGATIVE
Nitrite: NEGATIVE
Protein, ur: 100 mg/dL — AB
Specific Gravity, Urine: 1.018 (ref 1.005–1.030)
pH: 5 (ref 5.0–8.0)

## 2018-10-18 LAB — CBC
HCT: 46.6 % (ref 39.0–52.0)
Hemoglobin: 15.1 g/dL (ref 13.0–17.0)
MCH: 29.8 pg (ref 26.0–34.0)
MCHC: 32.4 g/dL (ref 30.0–36.0)
MCV: 91.9 fL (ref 80.0–100.0)
Platelets: 274 10*3/uL (ref 150–400)
RBC: 5.07 MIL/uL (ref 4.22–5.81)
RDW: 12.6 % (ref 11.5–15.5)
WBC: 9.1 10*3/uL (ref 4.0–10.5)
nRBC: 0 % (ref 0.0–0.2)

## 2018-10-18 LAB — LIPASE, BLOOD: Lipase: 37 U/L (ref 11–51)

## 2018-10-18 MED ORDER — SODIUM CHLORIDE 0.9 % IV BOLUS
1000.0000 mL | Freq: Once | INTRAVENOUS | Status: AC
  Administered 2018-10-18: 1000 mL via INTRAVENOUS

## 2018-10-18 MED ORDER — SODIUM CHLORIDE 0.9 % IV BOLUS: 1000.0000 mL | Freq: Once | INTRAVENOUS | Status: DC

## 2018-10-18 MED ORDER — SODIUM CHLORIDE 0.9% FLUSH: 3.0000 mL | Freq: Once | INTRAVENOUS | Status: DC

## 2018-10-18 MED ORDER — ONDANSETRON HCL 4 MG/2ML IJ SOLN
4.0000 mg | Freq: Once | INTRAMUSCULAR | Status: AC
  Administered 2018-10-18: 4 mg via INTRAVENOUS
  Filled 2018-10-18: qty 2

## 2018-10-18 NOTE — ED Notes (Signed)
**Note De-identified Han[place] Obfuscation** Patient transported to CT 

## 2018-10-18 NOTE — Discharge Instructions (Addendum)
**Note De-Identified Amrein Obfuscation** Stay hydrated.   Take imodium as needed for diarrhea, up to 10 times a day   You have small umbilical hernia and you can see surgery for follow up   Return to ER if you have worse abdominal pain, vomiting, dehydration, persistent diarrhea for another week

## 2018-10-18 NOTE — ED Provider Notes (Signed)
**Note De-Identified Derek Donaldson Obfuscation** [place] EMERGENCY DEPARTMENT Provider Note   CSN: [id number] Arrival date & time: 10/18/18  1002    History   Chief Complaint Chief Complaint  Patient presents with  . Diarrhea    HPI Derek Donaldson is a 48 y.o. male here history of Chiari malformation status post surgery, hypertension, kidney stones here presenting with diarrhea, abdominal cramps.  Patient states that for the last week or so he has been having 4-5 episodes of diarrhea daily.  It is associated with some abdominal cramping as well.  Patient states that he is nauseated but had no vomiting.  He went to urgent care about a week ago and was thought to have a viral gastroenteritis.  He had persistent diarrhea and today he had about 6 episodes diarrhea so he came into the ER for evaluation.  Still denies any fevers.  Denies any urinary symptoms.  He states that he has more right lower quadrant pain now.  He had previous kidney stones but this does not seem to feel the same.  Patient denies any recent travel or sick contacts or eating bad food.  Patient states that he has been having an umbilical hernia that has been distended on and off but states that it is reducible      The history is provided by the patient.    Past Medical History:  Diagnosis Date  . Asthma    Albuterol inhaler prn;just a small touch  . Cancer ([place]) 2010   skin ( unsure of the pathology)- removed  . Chiari malformation   . Essential hypertension, benign    takes Metoprolol,Diovan,and Amlodipine daily  . Headache    migraines in the past  . History of bronchitis   . History of kidney stones    passed on his own  . Mixed hyperlipidemia    takes Fenofibrate daily  . Muscle spasm    takes Flexeril daily as needed  . Numbness    left arm from chiari malformation  . Pneumonia 2005   hx of  . Shortness of breath    with exertion    Patient Active Problem List   Diagnosis Date Noted  . Chemical meningitis   . Meningitis  after procedure 12/18/2016  . Mild intermittent asthma 12/18/2016  . Hyponatremia 12/18/2016  . Leukocytosis   . Facial nerve palsy, secondary   . ICH (intracerebral hemorrhage) ([place]) 07/15/2014  . Dural arteriovenous fistula   . AVF (arteriovenous fistula) ([place]) 06/05/2014  . 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 Chiari malformation type I ([place]) 12/21/2013  . Chronic cholecystitis with calculus s/p lap chole [id number] 04/03/2012  . Choledocholithiasis s/p ERCP [id number] 03/21/2012  . GERD (gastroesophageal reflux disease)   . Essential hypertension, benign 09/22/2011  . Mixed hyperlipidemia 09/22/2011    Past Surgical History:  Procedure Laterality Date  . CEREBRAL ANGIOGRAM  12/2013  . CHOLECYSTECTOMY  11/13  . ERCP  03/21/2012   Procedure: ENDOSCOPIC RETROGRADE CHOLANGIOPANCREATOGRAPHY (ERCP);  Surgeon: Jeryl Columbia, MD;  Location: [facility];  Service: Endoscopy;  Laterality: N/A;  aqntibiotics and type and screen  . IR GENERIC HISTORICAL  07/20/2016   IR ANGIO INTRA EXTRACRAN SEL COM CAROTID INNOMINATE BILAT MOD SED 07/20/2016 [facility] Bras, MD [facility]  . IR GENERIC HISTORICAL  07/20/2016   IR ANGIO VERTEBRAL SEL VERTEBRAL UNI R MOD SED 07/20/2016 Lu[facility] Bras, MD [facility]  . IR GENERIC HISTORICAL  07/20/2016   IR ANGIO VERTEBRAL SEL SUBCLAVIAN INNOMINATE UNI L **Note De-Identified Avetisyan Obfuscation** MOD SED 07/20/2016 Lu[facility] Bras, MD [facility]  . MRI     x 2  . RADIOLOGY WITH ANESTHESIA N/A 02/20/2014   Procedure: RADIOLOGY WITH ANESTHESIA EMBOLIZATION;  Surgeon: Rob Hickman, MD;  Location: [place];  Service: Radiology;  Laterality: N/A;  . RADIOLOGY WITH ANESTHESIA N/A 04/29/2014   Procedure: STAGE TWO EMBOLIZATION;  Surgeon: Luanne Bras, MD;  Location: [place];  Service: Radiology;  Laterality: N/A;  . RADIOLOGY WITH ANESTHESIA N/A 06/05/2014   Procedure: Embolization;  Surgeon: Rob Hickman, MD;  Location: [place];  Service: Radiology;  Laterality: N/A;  . RADIOLOGY WITH  ANESTHESIA N/A 07/15/2014   Procedure: RADIOLOGY WITH ANESTHESIA/EMBOLIZATION;  Surgeon: Rob Hickman, MD;  Location: [place];  Service: Radiology;  Laterality: N/A;  . STAPEDECTOMY  04/23/2003   Revision left stapedectomy  . SUBOCCIPITAL CRANIECTOMY CERVICAL LAMINECTOMY N/A 12/21/2013   Procedure: Aborted Cranioplasty For Chiari Malformation (Could not complete procedure);  Surgeon: Ashok Pall, MD;  Location: [facility] [facility];  Service: Neurosurgery;  Laterality: N/A;  Aborted Cranioplasty For Chiari Malformation  . SUBOCCIPITAL CRANIECTOMY CERVICAL LAMINECTOMY N/A 12/09/2016   Procedure: CHIARI DECOMPRESSION;  Surgeon: Ashok Pall, MD;  Location: [place];  Service: Neurosurgery;  Laterality: N/A;  Posterior   . VASECTOMY          Home Medications    Prior to Admission medications   Medication Sig Start Date End Date Taking? Authorizing Provider  albuterol (PROVENTIL HFA;VENTOLIN HFA) 108 (90 BASE) MCG/ACT inhaler Inhale 2 puffs into the lungs every 4 (four) hours as needed for wheezing or shortness of breath.    [provider]  amLODipine (NORVASC) 10 MG tablet Take 10 mg by mouth daily before breakfast.     [provider]  aspirin EC 81 MG tablet Take 81 mg by mouth daily.    [provider]  diphenhydrAMINE (BENADRYL) 25 mg capsule Take 25 mg by mouth every 6 (six) hours as needed for itching.    [provider]  EPINEPHrine 0.3 mg/0.3 mL IJ SOAJ injection Inject 0.3 mg into the muscle once as needed (severe allergic reaction).     [provider]  fenofibrate micronized (ANTARA) 130 MG capsule Take 130 mg by mouth daily before breakfast.    [provider]  HYDROcodone-acetaminophen (NORCO/VICODIN) 5-325 MG tablet Take 1 tablet by mouth every 6 (six) hours as needed for moderate pain. 12/11/16   Ashok Pall, MD  ibuprofen (ADVIL,MOTRIN) 600 MG tablet Take 1 tablet (600 mg total) by mouth every 8 (eight) hours as needed for mild  pain. 01/25/17   Duffy Bruce, MD  metoprolol succinate (TOPROL-XL) 25 MG 24 hr tablet Take 25 mg by mouth daily before breakfast.     [provider]  ondansetron (ZOFRAN ODT) 4 MG disintegrating tablet Take 1 tablet (4 mg total) by mouth every 8 (eight) hours as needed for nausea or vomiting. 01/25/17   Duffy Bruce, MD  oxyCODONE-acetaminophen (PERCOCET/ROXICET) 5-325 MG tablet Take 1-2 tablets by mouth every 4 (four) hours as needed for severe pain. 01/25/17   Duffy Bruce, MD  potassium chloride SA (K-DUR,KLOR-CON) 20 MEQ tablet Take 1 tablet (20 mEq total) by mouth daily. 10/20/15   Milton Ferguson, MD  simethicone (GAS-X) 80 MG chewable tablet Chew 1 tablet (80 mg total) by mouth every 6 (six) hours as needed for flatulence. 10/15/18   Zigmund Gottron, NP  tiZANidine (ZANAFLEX) 4 MG tablet Take 1 tablet (4 mg total) by mouth every 6 ( **Note De-Identified Kram Obfuscation** six) hours as needed for muscle spasms. 12/11/16   Ashok Pall, MD  valsartan-hydrochlorothiazide (DIOVAN-HCT) 320-25 MG per tablet Take 1 tablet by mouth daily before breakfast.     [provider]    Family History Family History  Problem Relation Age of Onset  . Aneurysm Father   . Asthma Mother   . Asthma Brother   . Heart failure Maternal Grandmother   . Cancer Maternal Grandfather        unknown type  . Stroke Paternal Grandfather     Social History Social History   Tobacco Use  . Smoking status: Never Smoker  . Smokeless tobacco: Never Used  Substance Use Topics  . Alcohol use: No  . Drug use: No     Allergies   Contrast media [iodinated diagnostic agents] and Shellfish allergy   Review of Systems Review of Systems  Gastrointestinal: Positive for abdominal pain and diarrhea.  All other systems reviewed and are negative.    Physical Exam Updated Vital Signs BP (!) 151/99   Pulse 78   Temp 97.9 F (36.6 C) (Oral)   Resp 16   SpO2 94%   Physical Exam Vitals signs and nursing note reviewed.  HENT:      Head: Normocephalic.     Nose: Nose normal.     Mouth/Throat:     Mouth: Mucous membranes are dry.  Eyes:     Extraocular Movements: Extraocular movements intact.     Pupils: Pupils are equal, round, and reactive to light.  Neck:     Musculoskeletal: Normal range of motion.  Cardiovascular:     Rate and Rhythm: Normal rate and regular rhythm.     Pulses: Normal pulses.     Heart sounds: Normal heart sounds.  Pulmonary:     Effort: Pulmonary effort is normal.     Breath sounds: Normal breath sounds.  Abdominal:     General: Abdomen is flat.     Comments: Umbilical hernia, easily reducible   Musculoskeletal: Normal range of motion.  Skin:    General: Skin is warm.     Capillary Refill: Capillary refill takes less than 2 seconds.  Neurological:     General: No focal deficit present.     Mental Status: He is alert and oriented to person, place, and time.  Psychiatric:        Mood and Affect: Mood normal.        Behavior: Behavior normal.      ED Treatments / Results  Labs (all labs ordered are listed, but only abnormal results are displayed) Labs Reviewed  COMPREHENSIVE METABOLIC PANEL - Abnormal; Notable for the following components:      Result Value   Glucose, Bld 103 (*)    Total Bilirubin 1.5 (*)    All other components within normal limits  URINALYSIS, ROUTINE W REFLEX MICROSCOPIC - Abnormal; Notable for the following components:   APPearance TURBID (*)    Hgb urine dipstick LARGE (*)    Protein, ur 100 (*)    All other components within normal limits  C DIFFICILE QUICK SCREEN W PCR REFLEX  GASTROINTESTINAL PANEL BY PCR, STOOL (REPLACES STOOL CULTURE)  LIPASE, BLOOD  CBC    EKG None  Radiology Ct Renal Stone Study  Result Date: 10/18/2018 CLINICAL DATA:  48 year old male with right lower quadrant, flank pain. EXAM: CT ABDOMEN AND PELVIS WITHOUT CONTRAST TECHNIQUE: Multidetector CT imaging of the abdomen and pelvis was performed following the standard  protocol without IV **Note De-Identified Folino Obfuscation** contrast. COMPARISON:  CT Abdomen and Pelvis 01/25/2017 and earlier. FINDINGS: Lower chest: Mild chronic left lower lobe scarring/atelectasis. Hepatobiliary: Evidence of hepatic steatosis. Surgically absent gallbladder. Pancreas: Negative. Spleen: Negative. Adrenals/Urinary Tract: Normal adrenal glands. Bilateral nephrolithiasis with 2-3 millimeter calculi in the lower poles similar to the 2018 CT. No perinephric stranding or hydronephrosis. Negative left ureter. Negative right ureter. Diminutive and unremarkable urinary bladder. Superimposed chronic bilateral pelvic phleboliths are stable. Stomach/Bowel: Mild diverticulosis of the ascending and transverse colon. Transverse colon redundancy. Normal appendix (series 3, image 63). Negative terminal ileum. No dilated or inflamed bowel. Negative stomach. No free air, free fluid. Small fat containing periumbilical hernia measuring 37 millimeters diameter has mildly increased since 2018 (sagittal image 76). No associated inflammation. Vascular/Lymphatic: Vascular patency is not evaluated in the absence of IV contrast. No lymphadenopathy. Reproductive: Negative. Other: No pelvic free fluid. Musculoskeletal: No acute osseous abnormality identified. IMPRESSION: 1. No acute or inflammatory process identified in the noncontrast abdomen or pelvis. Normal appendix. 2. Chronic nephrolithiasis. No obstructive uropathy. 3. Hepatic steatosis. 4. Small fat containing periumbilical hernia has mildly increased since 2018. Electronically Signed   By: Genevie Ann M.D.   On: 10/18/2018 13:02    Procedures Procedures (including critical care time)  Medications Ordered in ED Medications  sodium chloride flush (NS) 0.9 % injection 3 mL (3 mLs Intravenous Not Given 10/18/18 1204)  sodium chloride 0.9 % bolus 1,000 mL (1,000 mLs Intravenous New Bag/Given 10/18/18 1201)  ondansetron (ZOFRAN) injection 4 mg (4 mg Intravenous Given 10/18/18 1202)     Initial Impression /  Assessment and Plan / ED Course  I have reviewed the triage vital signs and the nursing notes.  Pertinent labs & imaging results that were available during my care of the patient were reviewed by me and considered in my medical decision making (see chart for details).       Hassan D Dunn is a 48 y.o. male here with diarrhea, abdominal pain for a week. Likely viral gastro. Will get labs, UA, CT ab/pel.    1:59 PM Labs unremarkable. UA showed blood but that is chronic. No obvious kidney stone on CT. There is incidental umbilical hernia that is small. Ordered C diff, GI pathogen panel but he is unable to give any stool sample. I think likely viral gastro. He can take imodium prn. Stable for discharge with surgery follow up for umbilical hernia.   Final Clinical Impressions(s) / ED Diagnoses   Final diagnoses:  None    ED Discharge Orders    None       Drenda Freeze, MD 10/18/18 1400

## 2018-10-18 NOTE — ED Triage Notes (Signed)
**Note De-Identified A[place] Obfuscation** Pt in c/o diarrhea for the past several days, went to urgent care on 5/31 and was told it was probably a virus, since that time symptoms have continued and pt now has RLQ pain, denies fever, no distress noted
